# Patient Record
Sex: Female | Born: 1950 | Race: Black or African American | Hispanic: No | Marital: Married | Smoking: Former smoker
Health system: Southern US, Community
[De-identification: ages and names within clinical notes are randomized; demographics above are authoritative.]

## PROBLEM LIST (undated history)

## (undated) DIAGNOSIS — R413 Other amnesia: Secondary | ICD-10-CM

## (undated) DIAGNOSIS — M1990 Unspecified osteoarthritis, unspecified site: Secondary | ICD-10-CM

## (undated) DIAGNOSIS — I4891 Unspecified atrial fibrillation: Secondary | ICD-10-CM

## (undated) DIAGNOSIS — I639 Cerebral infarction, unspecified: Secondary | ICD-10-CM

## (undated) DIAGNOSIS — H539 Unspecified visual disturbance: Secondary | ICD-10-CM

## (undated) DIAGNOSIS — H9190 Unspecified hearing loss, unspecified ear: Secondary | ICD-10-CM

## (undated) DIAGNOSIS — E039 Hypothyroidism, unspecified: Secondary | ICD-10-CM

## (undated) DIAGNOSIS — E785 Hyperlipidemia, unspecified: Secondary | ICD-10-CM

## (undated) DIAGNOSIS — K219 Gastro-esophageal reflux disease without esophagitis: Secondary | ICD-10-CM

## (undated) DIAGNOSIS — N390 Urinary tract infection, site not specified: Secondary | ICD-10-CM

## (undated) DIAGNOSIS — J45909 Unspecified asthma, uncomplicated: Secondary | ICD-10-CM

## (undated) DIAGNOSIS — I10 Essential (primary) hypertension: Secondary | ICD-10-CM

## (undated) DIAGNOSIS — R339 Retention of urine, unspecified: Secondary | ICD-10-CM

## (undated) DIAGNOSIS — Z8719 Personal history of other diseases of the digestive system: Secondary | ICD-10-CM

## (undated) DIAGNOSIS — E669 Obesity, unspecified: Secondary | ICD-10-CM

## (undated) DIAGNOSIS — H9313 Tinnitus, bilateral: Secondary | ICD-10-CM

## (undated) DIAGNOSIS — F319 Bipolar disorder, unspecified: Secondary | ICD-10-CM

## (undated) DIAGNOSIS — M3500 Sicca syndrome, unspecified: Secondary | ICD-10-CM

## (undated) HISTORY — DX: Unspecified visual disturbance: H53.9

## (undated) HISTORY — PX: NECK SURGERY: SHX720

## (undated) HISTORY — DX: Unspecified hearing loss, unspecified ear: H91.90

## (undated) HISTORY — PX: JOINT REPLACEMENT: SHX530

## (undated) HISTORY — PX: EYE SURGERY: SHX253

## (undated) HISTORY — DX: Hyperlipidemia, unspecified: E78.5

## (undated) HISTORY — DX: Essential (primary) hypertension: I10

## (undated) HISTORY — DX: Urinary tract infection, site not specified: N39.0

## (undated) HISTORY — DX: Obesity, unspecified: E66.9

## (undated) HISTORY — PX: COLONOSCOPY: SHX174

## (undated) HISTORY — PX: ABDOMINAL HYSTERECTOMY: SHX81

## (undated) HISTORY — DX: Retention of urine, unspecified: R33.9

## (undated) HISTORY — PX: FOOT SURGERY: SHX648

## (undated) HISTORY — PX: VESICOVAGINAL FISTULA CLOSURE W/ TAH: SUR271

## (undated) HISTORY — DX: Other amnesia: R41.3

## (undated) HISTORY — PX: CARPAL TUNNEL RELEASE: SHX101

## (undated) HISTORY — PX: BACK SURGERY: SHX140

---

## 1997-06-20 ENCOUNTER — Other Ambulatory Visit: Admission: RE | Admit: 1997-06-20 | Discharge: 1997-06-20 | Payer: Self-pay | Admitting: *Deleted

## 1999-05-25 ENCOUNTER — Other Ambulatory Visit: Admission: RE | Admit: 1999-05-25 | Discharge: 1999-05-25 | Payer: Self-pay | Admitting: *Deleted

## 2005-04-12 ENCOUNTER — Other Ambulatory Visit: Payer: Self-pay

## 2005-04-12 ENCOUNTER — Inpatient Hospital Stay: Payer: Self-pay | Admitting: Internal Medicine

## 2005-04-19 ENCOUNTER — Ambulatory Visit: Payer: Self-pay | Admitting: Nurse Practitioner

## 2005-04-21 ENCOUNTER — Other Ambulatory Visit: Payer: Self-pay

## 2005-04-21 ENCOUNTER — Inpatient Hospital Stay: Payer: Self-pay | Admitting: Internal Medicine

## 2005-05-06 ENCOUNTER — Ambulatory Visit: Payer: Self-pay | Admitting: General Surgery

## 2005-07-28 ENCOUNTER — Ambulatory Visit: Payer: Self-pay | Admitting: Nurse Practitioner

## 2006-10-12 IMAGING — NM NUCLEAR MEDICINE HEPATOHBILIARY INCLUDE GB
2 series · 12 of 12 positions shown · non-contrast
Comparison: none

REASON FOR EXAM: RUQ pain
COMMENTS:

[Series 1: gallbladder dynamic · 4.80mm/px · 6 of 60 frames shown]
[frame 6/60]
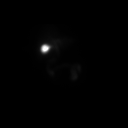
[frame 16/60]
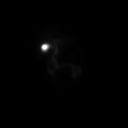
[frame 26/60]
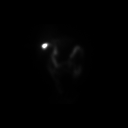
[frame 36/60]
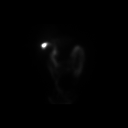
[frame 46/60]
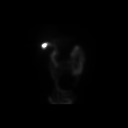
[frame 56/60]
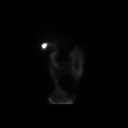

[Series 1: gallbladder dynamic (results) · 4.80mm/px · 6 of 60 frames shown]
[frame 6/60]
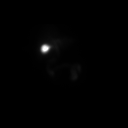
[frame 16/60]
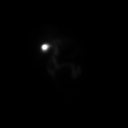
[frame 26/60]
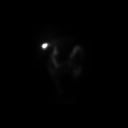
[frame 36/60]
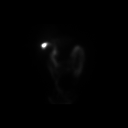
[frame 46/60]
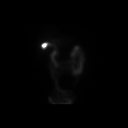
[frame 56/60]
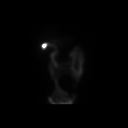

[12 of 12 positions shown; findings below may reference images not displayed]

PROCEDURE:     NM  - NM HEPATO WITH GB EJECT FRACTION  - May 06, 2005  [DATE]

RESULT:     Following injection of 7.79 mCi of Technetium 99m Choletec,
there is noted prompt visualization of tracer activity in the liver at 3
minutes. At 15 minutes, tracer activity is seen in the common duct and
proximal small bowel. At 20 minutes, tracer activity is visualized in the
gallbladder.

The gallbladder ejection fraction at 30 minutes measures 76% which is in the
normal range.
IMPRESSION: 1.     Normal Hepatobiliary Scan.
2.     Normal gallbladder ejection fraction.

## 2008-02-07 ENCOUNTER — Ambulatory Visit: Payer: Self-pay | Admitting: Family Medicine

## 2008-06-30 ENCOUNTER — Emergency Department (HOSPITAL_COMMUNITY): Admission: EM | Admit: 2008-06-30 | Discharge: 2008-07-01 | Payer: Self-pay | Admitting: Emergency Medicine

## 2008-07-08 ENCOUNTER — Inpatient Hospital Stay: Payer: Self-pay | Admitting: Internal Medicine

## 2008-07-24 ENCOUNTER — Encounter: Payer: Self-pay | Admitting: Internal Medicine

## 2008-08-18 ENCOUNTER — Encounter: Payer: Self-pay | Admitting: Internal Medicine

## 2008-08-27 ENCOUNTER — Encounter: Admission: RE | Admit: 2008-08-27 | Discharge: 2008-08-27 | Payer: Self-pay | Admitting: Neurosurgery

## 2008-09-27 ENCOUNTER — Ambulatory Visit (HOSPITAL_COMMUNITY): Admission: RE | Admit: 2008-09-27 | Discharge: 2008-09-28 | Payer: Self-pay | Admitting: Neurosurgery

## 2008-11-20 ENCOUNTER — Encounter: Admission: RE | Admit: 2008-11-20 | Discharge: 2008-11-20 | Payer: Self-pay | Admitting: Neurosurgery

## 2009-01-18 DIAGNOSIS — Z8719 Personal history of other diseases of the digestive system: Secondary | ICD-10-CM

## 2009-01-18 HISTORY — DX: Personal history of other diseases of the digestive system: Z87.19

## 2009-07-03 ENCOUNTER — Ambulatory Visit: Payer: Self-pay | Admitting: Rheumatology

## 2009-08-19 ENCOUNTER — Ambulatory Visit: Payer: Self-pay | Admitting: Obstetrics and Gynecology

## 2009-12-22 ENCOUNTER — Ambulatory Visit
Admission: RE | Admit: 2009-12-22 | Discharge: 2009-12-22 | Payer: Self-pay | Source: Home / Self Care | Admitting: Orthopedic Surgery

## 2009-12-22 ENCOUNTER — Ambulatory Visit: Payer: Self-pay | Admitting: Neurosurgery

## 2010-02-08 ENCOUNTER — Encounter: Payer: Self-pay | Admitting: Obstetrics & Gynecology

## 2010-03-31 LAB — POCT I-STAT 4, (NA,K, GLUC, HGB,HCT): Glucose, Bld: 79 mg/dL (ref 70–99)

## 2010-04-24 LAB — DIFFERENTIAL
Basophils Relative: 0 % (ref 0–1)
Lymphocytes Relative: 10 % — ABNORMAL LOW (ref 12–46)
Lymphs Abs: 0.7 10*3/uL (ref 0.7–4.0)
Monocytes Absolute: 0.6 10*3/uL (ref 0.1–1.0)
Monocytes Relative: 8 % (ref 3–12)
Neutro Abs: 4.7 10*3/uL (ref 1.7–7.7)

## 2010-04-24 LAB — BASIC METABOLIC PANEL
CO2: 30 mEq/L (ref 19–32)
Calcium: 9.7 mg/dL (ref 8.4–10.5)
Chloride: 106 mEq/L (ref 96–112)
GFR calc Af Amer: >60 mL/min (ref >60)
Sodium: 140 mEq/L (ref 135–145)

## 2010-04-24 LAB — CBC
Hemoglobin: 12.5 g/dL (ref 12.0–15.0)
MCHC: 33.1 g/dL (ref 30.0–36.0)
RBC: 4.25 MIL/uL (ref 3.87–5.11)

## 2010-04-24 LAB — TYPE AND SCREEN

## 2010-04-24 LAB — ABO/RH: ABO/RH(D): A POS

## 2010-04-27 LAB — CBC
HCT: 40.1 % (ref 36.0–46.0)
Hemoglobin: 13.3 g/dL (ref 12.0–15.0)
RBC: 4.64 MIL/uL (ref 3.87–5.11)
WBC: 7.5 10*3/uL (ref 4.0–10.5)

## 2010-04-27 LAB — BASIC METABOLIC PANEL
GFR calc Af Amer: >60 mL/min (ref >60)
GFR calc non Af Amer: >60 mL/min (ref >60)
Potassium: 3.4 mEq/L — ABNORMAL LOW (ref 3.5–5.1)
Sodium: 137 mEq/L (ref 135–145)

## 2010-04-28 ENCOUNTER — Other Ambulatory Visit: Payer: Self-pay | Admitting: Rheumatology

## 2012-06-22 ENCOUNTER — Other Ambulatory Visit: Payer: Self-pay | Admitting: Neurosurgery

## 2012-06-22 DIAGNOSIS — M5412 Radiculopathy, cervical region: Secondary | ICD-10-CM

## 2012-06-23 ENCOUNTER — Ambulatory Visit
Admission: RE | Admit: 2012-06-23 | Discharge: 2012-06-23 | Disposition: A | Discharge status: Home / Self Care | Source: Ambulatory Visit | Attending: Neurosurgery | Admitting: Neurosurgery

## 2012-06-23 DIAGNOSIS — M5412 Radiculopathy, cervical region: Secondary | ICD-10-CM

## 2013-10-01 ENCOUNTER — Other Ambulatory Visit: Payer: Self-pay | Admitting: Otolaryngology

## 2013-10-01 DIAGNOSIS — H9313 Tinnitus, bilateral: Secondary | ICD-10-CM

## 2013-10-10 ENCOUNTER — Ambulatory Visit
Admission: RE | Admit: 2013-10-10 | Discharge: 2013-10-10 | Disposition: A | Discharge status: Home / Self Care | Source: Ambulatory Visit | Attending: Otolaryngology | Admitting: Otolaryngology

## 2013-10-10 DIAGNOSIS — H9313 Tinnitus, bilateral: Secondary | ICD-10-CM

## 2013-10-10 MED ORDER — GADOBENATE DIMEGLUMINE 529 MG/ML IV SOLN
20.0000 mL | Freq: Once | INTRAVENOUS | Status: AC | PRN
  Administered 2013-10-10: 20 mL via INTRAVENOUS

## 2013-10-30 ENCOUNTER — Ambulatory Visit: Payer: Self-pay | Admitting: Neurology

## 2014-05-09 ENCOUNTER — Ambulatory Visit
Admit: 2014-05-09 | Disposition: A | Discharge status: Home / Self Care | Payer: Self-pay | Attending: Urology | Admitting: Urology

## 2014-06-20 ENCOUNTER — Ambulatory Visit (INDEPENDENT_AMBULATORY_CARE_PROVIDER_SITE_OTHER): Admitting: Neurology

## 2014-06-20 ENCOUNTER — Encounter: Payer: Self-pay | Admitting: Neurology

## 2014-06-20 VITALS — BP 148/90 | HR 76 | Resp 18 | Ht 69.5 in | Wt 231.6 lb

## 2014-06-20 DIAGNOSIS — H9319 Tinnitus, unspecified ear: Secondary | ICD-10-CM | POA: Insufficient documentation

## 2014-06-20 DIAGNOSIS — G5600 Carpal tunnel syndrome, unspecified upper limb: Secondary | ICD-10-CM | POA: Insufficient documentation

## 2014-06-20 DIAGNOSIS — R51 Headache: Secondary | ICD-10-CM

## 2014-06-20 DIAGNOSIS — M6289 Other specified disorders of muscle: Secondary | ICD-10-CM | POA: Insufficient documentation

## 2014-06-20 DIAGNOSIS — J45909 Unspecified asthma, uncomplicated: Secondary | ICD-10-CM | POA: Insufficient documentation

## 2014-06-20 DIAGNOSIS — R4189 Other symptoms and signs involving cognitive functions and awareness: Secondary | ICD-10-CM | POA: Diagnosis not present

## 2014-06-20 DIAGNOSIS — E785 Hyperlipidemia, unspecified: Secondary | ICD-10-CM | POA: Insufficient documentation

## 2014-06-20 DIAGNOSIS — G5601 Carpal tunnel syndrome, right upper limb: Secondary | ICD-10-CM | POA: Diagnosis not present

## 2014-06-20 DIAGNOSIS — H9313 Tinnitus, bilateral: Secondary | ICD-10-CM

## 2014-06-20 DIAGNOSIS — M1990 Unspecified osteoarthritis, unspecified site: Secondary | ICD-10-CM | POA: Insufficient documentation

## 2014-06-20 DIAGNOSIS — R29898 Other symptoms and signs involving the musculoskeletal system: Secondary | ICD-10-CM

## 2014-06-20 DIAGNOSIS — Z9889 Other specified postprocedural states: Secondary | ICD-10-CM | POA: Diagnosis not present

## 2014-06-20 DIAGNOSIS — I10 Essential (primary) hypertension: Secondary | ICD-10-CM | POA: Insufficient documentation

## 2014-06-20 DIAGNOSIS — J302 Other seasonal allergic rhinitis: Secondary | ICD-10-CM | POA: Insufficient documentation

## 2014-06-20 DIAGNOSIS — L309 Dermatitis, unspecified: Secondary | ICD-10-CM | POA: Insufficient documentation

## 2014-06-20 DIAGNOSIS — K219 Gastro-esophageal reflux disease without esophagitis: Secondary | ICD-10-CM | POA: Insufficient documentation

## 2014-06-20 DIAGNOSIS — R519 Headache, unspecified: Secondary | ICD-10-CM | POA: Insufficient documentation

## 2014-06-20 DIAGNOSIS — E669 Obesity, unspecified: Secondary | ICD-10-CM | POA: Insufficient documentation

## 2014-06-20 NOTE — Progress Notes (Addendum)
[facility]  PATIENT: Jessica Norman DOB: [birth year]  REFERRING DOCTOR OR PCP:  Delight Stare SOURCE: paitent and records from Dr. Manuella Ghazi.     _________________________________   HISTORICAL  CHIEF COMPLAINT:  Chief Complaint  Patient presents with  . Tremors    Sts. she had had tinnitus for yrs. She saw ENT in Oct. 2015 and he ordered a CT head.  Sts. changes on the CT prompted a neurologist--she was seen at the [facility] in . Sts. emg was done to investigate her c/o difficulty walking.  "When I tell my limbs to do something they are not responding."  Sts. she was told emg was abnormal, and he felt she may have Mysathenia Gravis.  She was started on Mestinon 72m bid.  Sts. she felt better until she had cataracts removed (bilat) a couple of  . Extremity Pain    months ago.  Sts. since then vision is worse and she has had increased craming in right arm/leg.  Sts. memory is worse--she forgets little things like the names of doctors she has seen. Sts. 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  She has been on leave since April of this yr./fim  . Memory Loss  . Sleep Apnea    Sts. sleep study showed mild osa, not severe enough for cpap./fim    HISTORY OF PRESENT ILLNESS:  I had the pleasure of seeing your patient, Jessica Norman for a neurologic consultation regarding her weakness and memory issues.    She notes that in February 2015 she began to have difficulty with her memory. Separately, she was having an issue with tinnitus and was seeing Dr. WErik Obey(ENT).  She underwent a CT scan followed by an MRI of the brain for tinnitus that year. The MRI of the brain was abnormal showing frontal and parietal subcortical white matter foci. There were also white matter foci within the pons. This prompted referral to to Dr. SManuella Ghazi   She is reporting several different types of  cognitive issues. 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 Problems that she noted included decreased memory, decreased multitasking and decreased focus. As examples, she would say something to clients and then not remember what was discussed.   She did have a sleep study performed as OSA can make memory worse. That was in October 2015 and showed no significant overall sleep apnea though there was moderate REM related sleep apnea with a REM AHI = 17.  Around early November, she began to note more difficulties with her strength. Specifically she had sporadic mild double vision and also had difficulty getting out of a chair. She had no ptosis.   Dr. SManuella Ghazidid a NCV/EMG in 12/28/2013. It showed carpal tunnel syndrome, right greater than left    Repetitive stimulation tests were not performed.    She also had blood work around that time. The acetylcholine receptor antibody was negative. Blocking antibodies and modulating antibodies were also negative.   However, based on her symptoms she was felt to have possible myasthenia gravis and was placed on Mestinon. She reports that she improved some after being placed on Mestinon.   However, she has noted some left abdominal pain that started after she began the  Mestinon. She has not noted any change in her bowel. About the same time as her studies and being placed on Mestinon, she also had cataract surgery. She reports that her vision got quite a bit better and she is uncertain where there is due to the surgery is being removed or due to  the Mestinon.   Since the surgery, distance vision is significantly better but reading vision is much worse and she is having trouble finding glasses that help her reading.   Although she feels her strength is little better, she is still is unable to rise from a chair without  using her arms.     I reviewed the studies including raw data:   NCV showed mild right CTS (minimal left).   She had two carpal tunnel injections, the first one helped but the second one did not help.      I personally reviewed the MRI images showing white matter changes predominantly in the subcortical and deep are both hemispheres and also in the pons.   I also reviewed the MRI of the cervical spine from 06/23/2012. The spinal cord appears normal.  She sees Dr. Viona Gilmore. Jessica Norman (psychiatrist [phone]) for bipolar disorder. She is on Zoloft and Depakote. She has been on these medications a long time and there have been no changes in the last year.  REVIEW OF SYSTEMS: Constitutional: No fevers, chills, sweats, or change in appetite Eyes: No visual changes, double vision, eye pain Ear, nose and throat: No hearing loss, ear pain, nasal congestion, sore throat Cardiovascular: No chest pain, palpitations Respiratory: No shortness of breath at rest or with exertion.   No wheezes GastrointestinaI: No nausea, vomiting, diarrhea, abdominal pain, fecal incontinence Genitourinary: No dysuria, urinary retention or frequency.  No nocturia. Musculoskeletal: No neck pain, back pain Integumentary: No rash, pruritus, skin lesions Neurological: as above Psychiatric: No depression at this time.  No anxiety Endocrine: No palpitations, diaphoresis, change in appetite, change in weigh or increased thirst Hematologic/Lymphatic: No anemia, purpura, petechiae. Allergic/Immunologic: No itchy/runny eyes, nasal congestion, recent allergic reactions, rashes  ALLERGIES: Allergies  Allergen Reactions  . Codeine Itching  . Penicillin G     Other reaction(s): ITCHING    HOME MEDICATIONS:  Current outpatient prescriptions:  .  aluminum hydroxide-magnesium carbonate (GAVISCON) 95-358 MG/15ML SUSP, Take by mouth., Disp: , Rfl:  .  amLODipine (NORVASC) 10 MG tablet, Take by mouth., Disp: , Rfl:  .  aspirin EC 81 MG  tablet, Take by mouth., Disp: , Rfl:  .  divalproex (DEPAKOTE ER) 500 MG 24 hr tablet, Take by mouth., Disp: , Rfl:  .  esomeprazole (NEXIUM) 40 MG capsule, Take by mouth., Disp: , Rfl:  .  fexofenadine (ALLEGRA) 180 MG tablet, Take 180 mg by mouth., Disp: , Rfl:  .  fluticasone (FLONASE) 50 MCG/ACT nasal spray, Place into the nose., Disp: , Rfl:  .  Fluticasone-Salmeterol (ADVAIR) 250-50 MCG/DOSE AEPB, Inhale into the lungs., Disp: , Rfl:  .  lisinopril-hydrochlorothiazide (PRINZIDE,ZESTORETIC) 20-25 MG per tablet, Take by mouth., Disp: , Rfl:  .  lovastatin (MEVACOR) 10 MG tablet, Take by mouth., Disp: , Rfl:  .  montelukast (SINGULAIR) 10 MG tablet, Take by mouth., Disp: , Rfl:  .  nabumetone (RELAFEN) 500 MG tablet, Take by mouth., Disp: , Rfl:  .  promethazine (PHENERGAN) 25 MG tablet, Take 25 mg by mouth., Disp: , Rfl:  .  pyridostigmine (MESTINON) 60 MG tablet, 1/2 tablet twice a day for 2 weeks then 1  tab twice a day, Disp: , Rfl:  .  sertraline (ZOLOFT) 100 MG tablet, Take by mouth., Disp: , Rfl:   PAST MEDICAL HISTORY: Past Medical History  Diagnosis Date  . Hearing loss   . Hypertension   . Memory loss   . Vision abnormalities     PAST SURGICAL HISTORY: Past Surgical History  Procedure Laterality Date  . Vesicovaginal fistula closure w/ tah    . Back surgery    . Neck surgery    . Carpal tunnel release Right     FAMILY HISTORY: Family History  Problem Relation Age of Onset  . Diabetes Mother   . Stroke Mother   . COPD Father     SOCIAL HISTORY:  History   Social History  . Marital Status: Married    Spouse Name: N/A  . Number of Children: N/A  . Years of Education: N/A   Occupational History  . Not on file.   Social History Main Topics  . Smoking status: Former Research scientist (life sciences)  . Smokeless tobacco: Not on file  . Alcohol Use: 0.0 oz/week    0 Standard drinks or equivalent per week     Comment: occasional wine or beer./fim  . Drug Use: No  . Sexual  Activity: Not on file   Other Topics Concern  . Not on file   Social History Narrative  . No narrative on file     PHYSICAL EXAM  Filed Vitals:   06/20/14 1014  BP: 148/90  Pulse: 76  Resp: 18  Height: 5' 9.5" (1.765 m)  Weight: 231 lb 9.6 oz (105.053 kg)    Body mass index is 33.72 kg/(m^2).   General: The patient is well-developed and well-nourished and in no acute distress  Eyes:  Funduscopic exam shows normal optic discs and retinal vessels.  Neck: The neck is supple, no carotid bruits are noted.  The neck is nontender.  Cardiovascular: The heart has a regular rate and rhythm with a normal S1 and S2. There were no murmurs, gallops or rubs.  Skin: Extremities show some edema.  Musculoskeletal:  Back is nontender  Neurologic Exam  Mental status: The patient is alert and oriented x 3 at the time of the examination. The patient has apparent normal  remote but poor recent memory (1/3 after 4 minutes), with an apparently normal attention span and concentration ability (100-93-85-74-no; WORLD-DLROW).   Clock is well spaced and very well drawn with good hand placement.  Speech is normal.  Cranial nerves: Extraocular movements are full. Pupils are equal, round, and reactive to light and accomodation.  Visual fields are full.  Facial symmetry is present. There is good facial sensation to soft touch bilaterally.Facial strength is normal.  Trapezius and sternocleidomastoid strength is normal. No dysarthria is noted.  The tongue is midline, and the patient has symmetric elevation of the soft palate. No obvious hearing deficits are noted.  Motor:  Muscle bulk is normal.   Tone is normal. Strength is  5 / 5 in all 4 extremities except prox leg (4- to 4/5). Functionally, she has difficulty raising out of a chair without using at least one arm. She cannot rise from a squat without support.  Sensory: Sensory testing is intact to pinprick, soft touch and vibration sensation in all 4  extremities.  Coordination: Cerebellar testing reveals good finger-nose-finger.  Gait and station: Station is normal.   Gait is arthritic. Tandem gait is wide. Romberg is negative.   Reflexes: Deep tendon reflexes are  symmetric and she has spread at her knees and normal DTRs elsewhere   Plantar responses are flexor.    DIAGNOSTIC DATA (LABS, IMAGING, TESTING) - I reviewed patient records, labs, notes, testing and imaging myself where available.  Lab Results  Component Value Date   WBC 6.9 09/24/2008   HGB 13.3 12/22/2009   HCT 39.0 12/22/2009   MCV 89.2 09/24/2008   PLT 168 09/24/2008      Component Value Date/Time   NA 141 12/22/2009 1310   K 4.5 12/22/2009 1310   CL 106 09/24/2008 1025   CO2 30 09/24/2008 1025   GLUCOSE 79 12/22/2009 1310   BUN 14 09/24/2008 1025   CREATININE 0.93 09/24/2008 1025   CALCIUM 9.7 09/24/2008 1025   GFRNONAA >60 09/24/2008 1025   GFRAA  09/24/2008 1025    >60        The eGFR has been calculated using the MDRD equation. This calculation has not been validated in all clinical situations. eGFR's persistently <60 mL/min signify possible Chronic Kidney Disease.       ASSESSMENT AND PLAN  Proximal leg weakness - Plan: Vitamin B12, ANA w/Reflex, CK, MR Cervical Spine Wo Contrast, MR Thoracic Spine Wo Contrast  Disturbed cognition - Plan: Vitamin B12, ANA w/Reflex, CK, MR Cervical Spine Wo Contrast, MR Thoracic Spine Wo Contrast  Carpal tunnel syndrome of right wrist  History of cervical spinal surgery - Plan: MR Cervical Spine Wo Contrast  Tinnitus, bilateral   In summary, Jessica Norman is a 64 year old woman with proximal weakness and memory difficulties. Evaluating her weakness is difficult as she is currently on Mestinon I am not certain that she has myasthenia gravis, however. Her antibody test was normal though. Negative test are fairly common. She does not have any weakness at all in the face right now nor in the neck or  shoulders. Certainly MG is possible. I will ask her to hold her Mestinon for couple weeks. She significantly worsen she will go back on. If she does not change at all, she will stay off the medicine. If she does feel that she did better on Mestinon in myasthenia gravis is more likely and I want to also check a CT scan of the chest to make sure that she does not have a thymoma and I would consider steroid therapy as she is getting an incomplete response to Mestinon. However, if she feels the same after stopping the medication, then MG is less likely and I would hold off on these tests.   On exam, her knee reflexes were increased and other etiologies could also be considered. I will  rule out some other etiologies of proximal weakness and we will obtain an MRI of the cervical spine and thoracic spine to make sure that she does not have a myelopathy. She has had prior ACDF surgery in the neck.  We will also check CK, ANA and B12.  Her second problem is difficulty with memory and other cognitive skills. Today, she did show decreased short-term memory and reduced attention but did very well with executive function and visual spatial tasks. This pattern might be more consistent with poor focus that with an actual memory disorder. If not contraindicated by her bipolar disease, a stimulant might be considered.  A third problem is mild carpal tunnel syndrome on the right. I let her know I would be happy to do a carpal tunnel injection again if she would like. She will think about this letter no.  She will  return to see me in 6 weeks or sooner if she has new or worsening neurologic symptoms.  Thank you for asking me to see Jessica Norman for neurologic consultation today. Please let me know if I can be of further assistance with her or other patients in the future.   Richard A. Felecia Shelling, MD, PhD 08/22/6312, 97:02 AM Certified in Neurology, Clinical Neurophysiology, Sleep Medicine, Pain Medicine and Neuroimaging  [facility] [address] [place], [place] [address] [phone]

## 2014-06-21 LAB — ENA+DNA/DS+SJORGEN'S
ENA RNP Ab: 0.2 AI (ref 0.0–0.9)
ENA SSA (RO) Ab: 2.8 AI — ABNORMAL HIGH (ref 0.0–0.9)
dsDNA Ab: 1 IU/mL (ref 0–9)

## 2014-06-21 LAB — ANA W/REFLEX: Anti Nuclear Antibody(ANA): POSITIVE — AB

## 2014-06-21 LAB — CK: CK TOTAL: 181 U/L — AB (ref 24–173)

## 2014-06-21 LAB — VITAMIN B12: Vitamin B-12: 1135 pg/mL — ABNORMAL HIGH (ref 211–946)

## 2014-07-03 ENCOUNTER — Telehealth: Payer: Self-pay | Admitting: *Deleted

## 2014-07-03 NOTE — Telephone Encounter (Signed)
-----   Message from Asa Lente, MD sent at 07/03/2014  8:34 AM EDT ----- One if her labs shows she has possible lupus or similar process.   I would like her to see rheumatology for a consult  "Muscle weakness and positive SSA/Ro"

## 2014-07-03 NOTE — Telephone Encounter (Signed)
I have spoken with Jessica Norman this morning and per RAS, advised her of abnormal labwork that may indicate a Lupus or other autoimmune type dz.  I have advised of the need for rheumatologist workup.  She is agreeable, sts. has been seen by rheumatologist Dr. Saverio Danker in [place], and would prefer to f/u with him.  Sts. she will call for appt.  Per her request, I have faxed ov note and lab results to Dr. Gavin Potters at fax # [phone]/fim

## 2014-07-05 DIAGNOSIS — R29898 Other symptoms and signs involving the musculoskeletal system: Secondary | ICD-10-CM | POA: Diagnosis not present

## 2014-07-10 ENCOUNTER — Other Ambulatory Visit: Payer: Self-pay | Admitting: Neurology

## 2014-07-10 DIAGNOSIS — R29898 Other symptoms and signs involving the musculoskeletal system: Secondary | ICD-10-CM

## 2014-07-10 DIAGNOSIS — Z9889 Other specified postprocedural states: Secondary | ICD-10-CM

## 2014-07-10 DIAGNOSIS — R4189 Other symptoms and signs involving cognitive functions and awareness: Secondary | ICD-10-CM

## 2014-07-11 ENCOUNTER — Telehealth: Payer: Self-pay | Admitting: *Deleted

## 2014-07-11 NOTE — Telephone Encounter (Signed)
-----   Message from Richard A Sater, MD sent at 07/10/2014  5:47 PM EDT ----- Please let her know the MRi shows some degenerative disc and arthritis changes but no pressure on the spinal cord 

## 2014-07-11 NOTE — Telephone Encounter (Signed)
I have spoken with Saline and per RAS, advised that mri showed arthritic, degen. type changes, but nothing putting pressure on the spinal cord.  She verbalized understanding of same/fim

## 2014-07-11 NOTE — Telephone Encounter (Signed)
Left message with female that answered phone for pt. to call.  No details left/fim

## 2014-07-11 NOTE — Telephone Encounter (Signed)
-----   Message from Asa Lente, MD sent at 07/10/2014  5:47 PM EDT ----- Please let her know the MRi shows some degenerative disc and arthritis changes but no pressure on the spinal cord

## 2014-07-11 NOTE — Telephone Encounter (Signed)
Patient called returning Jessica Norman's call. °

## 2014-07-11 NOTE — Telephone Encounter (Signed)
I have spoken with Nely this afternoon and advised that per RAS, mri showed arthritic, degen. type changes, but nothing putting pressure on spinal cord. She verbalized understanding of same/fim

## 2014-07-16 DIAGNOSIS — K6389 Other specified diseases of intestine: Secondary | ICD-10-CM | POA: Insufficient documentation

## 2014-07-16 DIAGNOSIS — K760 Fatty (change of) liver, not elsewhere classified: Secondary | ICD-10-CM | POA: Insufficient documentation

## 2014-07-16 DIAGNOSIS — R1012 Left upper quadrant pain: Secondary | ICD-10-CM | POA: Insufficient documentation

## 2014-07-16 DIAGNOSIS — R9389 Abnormal findings on diagnostic imaging of other specified body structures: Secondary | ICD-10-CM | POA: Insufficient documentation

## 2014-08-01 ENCOUNTER — Ambulatory Visit (INDEPENDENT_AMBULATORY_CARE_PROVIDER_SITE_OTHER): Admitting: Neurology

## 2014-08-01 ENCOUNTER — Encounter: Payer: Self-pay | Admitting: Neurology

## 2014-08-01 VITALS — BP 130/68 | HR 68 | Resp 16 | Ht 69.5 in | Wt 230.0 lb

## 2014-08-01 DIAGNOSIS — H9313 Tinnitus, bilateral: Secondary | ICD-10-CM

## 2014-08-01 DIAGNOSIS — G4700 Insomnia, unspecified: Secondary | ICD-10-CM

## 2014-08-01 DIAGNOSIS — R29898 Other symptoms and signs involving the musculoskeletal system: Secondary | ICD-10-CM

## 2014-08-01 DIAGNOSIS — R4189 Other symptoms and signs involving cognitive functions and awareness: Secondary | ICD-10-CM

## 2014-08-01 NOTE — Progress Notes (Signed)
[facility]  PATIENT: Jessica Norman DOB: April 02, 1950  REFERRING DOCTOR OR PCP:  Delight Stare SOURCE: paitent and records from Dr. Manuella Ghazi.     _________________________________   HISTORICAL  CHIEF COMPLAINT:  Chief Complaint  Patient presents with  . Memory Loss    Sts. difficulty with memory is about the same.  Sts. she did hold Mestinon for 2 weeks as instructed at last ov.  Sts. she felt no difference in sx. off of the Mestinon than she did on it.  She did, however restart it after 2 weeks.  She had autoimmune labs that were abnormal.  She has followed up with a rheumatologist (Dr. Jefm Bryant in [place]), and sts. he has more testing scheduled/fim  . Extremity Weakness    HISTORY OF PRESENT ILLNESS:  Jessica Norman is a 64 yo woman recently seen for proximal weakness and memory difficulties.  Weakness:   She tried going off of Mestinon and felt the same as she did when she was on it did go back on the medication and continued to feel the same. We discussed stopping the Mestinon as the likelihood of myasthenia is low.     She has difficulty getting out of a chair and climbing stairs but has normal strength outside of the proximal legs.   No ptosis or diplopia.    The acetylcholine receptor antibody was negative. Blocking antibodies and modulating antibodies were also negative.   NCV showed mild right CTS (minimal left).    I  reviewed the MRI of the cervical spine from 06/23/2012. The spinal cord appears normal.      Memory/insomnia:   Since February 2015 she began to have difficulty with her memory.  MRI of the brain  was abnormal showing frontal and parietal subcortical white matter foci. There were also white matter foci within the pons. This prompted referral to to Dr. Manuella Ghazi.   She is reporting several different types of cognitive issues including decreased memory, decreased multitasking and decreased focus.   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    She is sleepy but does not actually doze off much.   She feels she is a light sleeper.  She wakes up quite a bit most nights and often has difficulty falling back asleep she uses the bathroom 2-5 times nightly.   She has trazodone and it helps sleep slightly but has a hangover effect.  A few years ago, she was on gabapentin and does not remember if she slept better.        Tinnitus:   Bilateral tinnitus is noted.   MRI of IAC region was fine.     ANA was positive (anti-Ro).   She repots a dry mouth but not dry eyes.    She is seing a rheumatologist who is doing more tests  She sees Dr. Viona Gilmore. Lanetta Inch (psychiatrist [phone]) for bipolar disorder. She is on Zoloft and Depakote.  Marland Kitchen   REVIEW OF SYSTEMS: Constitutional: No fevers, chills, sweats, or change in appetite Eyes: No visual changes, double vision, eye pain Ear, nose and throat: No hearing loss, ear pain, nasal congestion, sore throat Cardiovascular: No chest pain, palpitations Respiratory: No shortness of breath at rest or with exertion.   No wheezes GastrointestinaI: No nausea, vomiting, diarrhea, abdominal pain, fecal incontinence Genitourinary: No dysuria, urinary  retention or frequency.  No nocturia. Musculoskeletal: No neck pain, back pain Integumentary: No rash, pruritus, skin lesions Neurological: as above Psychiatric: No depression at this time.  No anxiety Endocrine: No palpitations, diaphoresis, change in appetite, change in weigh or increased thirst Hematologic/Lymphatic: No anemia, purpura, petechiae. Allergic/Immunologic: No itchy/runny eyes, nasal congestion, recent allergic reactions, rashes  ALLERGIES: Allergies  Allergen Reactions  . Codeine Itching  . Penicillin G     Other reaction(s): ITCHING    HOME MEDICATIONS:  Current outpatient prescriptions:  .  aluminum  hydroxide-magnesium carbonate (GAVISCON) 95-358 MG/15ML SUSP, Take by mouth., Disp: , Rfl:  .  amLODipine (NORVASC) 10 MG tablet, Take by mouth., Disp: , Rfl:  .  aspirin EC 81 MG tablet, Take by mouth., Disp: , Rfl:  .  divalproex (DEPAKOTE ER) 500 MG 24 hr tablet, Take by mouth., Disp: , Rfl:  .  esomeprazole (NEXIUM) 40 MG capsule, Take by mouth., Disp: , Rfl:  .  fexofenadine (ALLEGRA) 180 MG tablet, Take 180 mg by mouth., Disp: , Rfl:  .  fluticasone (FLONASE) 50 MCG/ACT nasal spray, Place into the nose., Disp: , Rfl:  .  Fluticasone-Salmeterol (ADVAIR) 250-50 MCG/DOSE AEPB, Inhale into the lungs., Disp: , Rfl:  .  lisinopril-hydrochlorothiazide (PRINZIDE,ZESTORETIC) 20-25 MG per tablet, Take by mouth., Disp: , Rfl:  .  lovastatin (MEVACOR) 10 MG tablet, Take by mouth., Disp: , Rfl:  .  nabumetone (RELAFEN) 500 MG tablet, Take by mouth., Disp: , Rfl:  .  pyridostigmine (MESTINON) 60 MG tablet, 1/2 tablet twice a day for 2 weeks then 1 tab twice a day, Disp: , Rfl:  .  sertraline (ZOLOFT) 100 MG tablet, Take by mouth., Disp: , Rfl:  .  montelukast (SINGULAIR) 10 MG tablet, Take by mouth., Disp: , Rfl:  .  promethazine (PHENERGAN) 25 MG tablet, Take 25 mg by mouth., Disp: , Rfl:   PAST MEDICAL HISTORY: Past Medical History  Diagnosis Date  . Hearing loss   . Hypertension   . Memory loss   . Vision abnormalities     PAST SURGICAL HISTORY: Past Surgical History  Procedure Laterality Date  . Vesicovaginal fistula closure w/ tah    . Back surgery    . Neck surgery    . Carpal tunnel release Right     FAMILY HISTORY: Family History  Problem Relation Age of Onset  . Diabetes Mother   . Stroke Mother   . COPD Father     SOCIAL HISTORY:  History   Social History  . Marital Status: Married    Spouse Name: N/A  . Number of Children: N/A  . Years of Education: N/A   Occupational History  . Not on file.   Social History Main Topics  . Smoking status: Former Research scientist (life sciences)  .  Smokeless tobacco: Not on file  . Alcohol Use: 0.0 oz/week    0 Standard drinks or equivalent per week     Comment: occasional wine or beer./fim  . Drug Use: No  . Sexual Activity: Not on file   Other Topics Concern  . Not on file   Social History Narrative     PHYSICAL EXAM  Filed Vitals:   08/01/14 0917  BP: 130/68  Pulse: 68  Resp: 16  Height: 5' 9.5" (1.765 m)  Weight: 230 lb (104.327 kg)    Body mass index is 33.49 kg/(m^2).   General: The patient is well-developed and well-nourished and in no acute distress  Neurologic Exam  Mental status:  The patient is alert and oriented x 3 at the time of the examination.   Speech is normal.  Cranial nerves: Extraocular movements are full.   Facial symmetry is present. There is good facial sensation to soft touch bilaterally.Facial strength is normal.  Trapezius and sternocleidomastoid strength is normal. No dysarthria is noted.  The tongue is midline, and the patient has symmetric elevation of the soft palate. No obvious hearing deficits are noted.  Motor:  Muscle bulk is normal.   Tone is normal. Strength is  5 / 5 in all 4 extremities except prox leg (4- to 4/5). Functionally, she has difficulty raising out of a chair without using at least one arm. She cannot rise from a squat without support.  Sensory: Sensory testing is intact to touch and vibration sensation in all 4 extremities.  Coordination: Cerebellar testing reveals good finger-nose-finger.  Gait and station: Station is normal.   Gait is arthritic.   Romberg is negative.   Reflexes: Deep tendon reflexes are symmetric and she has spread at her knees and normal DTRs elsewhere        DIAGNOSTIC DATA (LABS, IMAGING, TESTING) - I reviewed patient records, labs, notes, testing and imaging myself where available.  Lab Results  Component Value Date   WBC 6.9 09/24/2008   HGB 13.3 12/22/2009   HCT 39.0 12/22/2009   MCV 89.2 09/24/2008   PLT 168 09/24/2008        Component Value Date/Time   NA 141 12/22/2009 1310   K 4.5 12/22/2009 1310   CL 106 09/24/2008 1025   CO2 30 09/24/2008 1025   GLUCOSE 79 12/22/2009 1310   BUN 14 09/24/2008 1025   CREATININE 0.93 09/24/2008 1025   CALCIUM 9.7 09/24/2008 1025   GFRNONAA >60 09/24/2008 1025   GFRAA  09/24/2008 1025    >60        The eGFR has been calculated using the MDRD equation. This calculation has not been validated in all clinical situations. eGFR's persistently <60 mL/min signify possible Chronic Kidney Disease.       ASSESSMENT AND PLAN  Disturbed cognition  Proximal leg weakness  Tinnitus, bilateral  Insomnia    1.   D/c mestinon.  If weakness worsens, consider referral to [facility] for Single Fiber EMG 2.   Gabapentin up to 600 mg nightly for insomnia.  Hopefully, memory issues will improve with longer deeper sleep 3.   She will discuss adding a stimulant with Dr. Thurmond Butts at the next visit.  I will fax him my note.   4.   rtc 4 months, sooner if problems   Freya Zobrist A. Felecia Shelling, MD, PhD 6/65/9935, 7:01 AM Certified in Neurology, Clinical Neurophysiology, Sleep Medicine, Pain Medicine and Neuroimaging  [facility] [address] [place], [place] [address] [phone]

## 2014-08-12 ENCOUNTER — Other Ambulatory Visit: Payer: Self-pay | Admitting: Rheumatology

## 2014-08-12 DIAGNOSIS — M25512 Pain in left shoulder: Secondary | ICD-10-CM

## 2014-08-16 ENCOUNTER — Ambulatory Visit
Admission: RE | Admit: 2014-08-16 | Discharge: 2014-08-16 | Disposition: A | Discharge status: Home / Self Care | Source: Ambulatory Visit | Attending: Rheumatology | Admitting: Rheumatology

## 2014-08-16 ENCOUNTER — Ambulatory Visit

## 2014-08-16 DIAGNOSIS — M19012 Primary osteoarthritis, left shoulder: Secondary | ICD-10-CM | POA: Diagnosis not present

## 2014-08-16 DIAGNOSIS — M7552 Bursitis of left shoulder: Secondary | ICD-10-CM | POA: Insufficient documentation

## 2014-08-16 DIAGNOSIS — M7522 Bicipital tendinitis, left shoulder: Secondary | ICD-10-CM | POA: Diagnosis not present

## 2014-08-16 DIAGNOSIS — M25512 Pain in left shoulder: Secondary | ICD-10-CM | POA: Diagnosis present

## 2014-11-25 ENCOUNTER — Encounter: Payer: Self-pay | Admitting: Neurology

## 2014-11-25 ENCOUNTER — Ambulatory Visit (INDEPENDENT_AMBULATORY_CARE_PROVIDER_SITE_OTHER): Admitting: Neurology

## 2014-11-25 VITALS — BP 108/58 | HR 70 | Resp 16 | Ht 69.5 in | Wt 227.4 lb

## 2014-11-25 DIAGNOSIS — R4189 Other symptoms and signs involving cognitive functions and awareness: Secondary | ICD-10-CM

## 2014-11-25 DIAGNOSIS — M6289 Other specified disorders of muscle: Secondary | ICD-10-CM

## 2014-11-25 DIAGNOSIS — R200 Anesthesia of skin: Secondary | ICD-10-CM | POA: Diagnosis not present

## 2014-11-25 DIAGNOSIS — G4700 Insomnia, unspecified: Secondary | ICD-10-CM

## 2014-11-25 DIAGNOSIS — R29898 Other symptoms and signs involving the musculoskeletal system: Secondary | ICD-10-CM | POA: Diagnosis not present

## 2014-11-25 NOTE — Progress Notes (Signed)
[facility]  PATIENT: Jessica Norman DOB: [birth year]  REFERRING DOCTOR OR PCP:  Delight Stare SOURCE: paitent and records from Dr. Manuella Ghazi.     _________________________________   HISTORICAL  CHIEF COMPLAINT:  Chief Complaint  Patient presents with  . Memory Loss    Sts. weakness is about the same since stopping Mestinon.  Sts. she is sleeping better with Gabapentin and feels memory is some better.  Since last ov, she has been dx. with Sjogren's Syndrome/fim  . Insomnia  . Extremity Weakness    HISTORY OF PRESENT ILLNESS:  Jessica Norman is a 64 yo woman recently seen for proximal weakness and memory difficulties.  Weakness/numbness: She remains off mestinon and feels the same as she did on the medication.    If anything, she feels strength may be minimally better.   She sometimes has difficulty getting out of a chair and climbing stairs but has normal strength outside of the proximal legs. No ptosis or diplopia. The acetylcholine receptor antibody was negative. Blocking antibodies and modulating antibodies were also negative. NCV showed mild right CTS (minimal left). I reviewed the MRI of the cervical spine from 06/23/2012. The spinal cord appears normal. She also notes numbness if there is pressure on her arm or leg or if she sits a while.      Shoulder pain:   She has left shoulder pain and has seen orthopedics for a shot and may need surgery.     Memory/insomnia: The past 1 1/2 years she has noted difficulties with memory.  As examples, she loses train of thoughts easily.  She cannot remember passwords.   During conversation at work, she forgets the next thing to say or do.  She also notes decreased multitasking and decreased focus.  MRI of the brain2015 was abnormal showing frontal and parietal subcortical white matter foci. There were also white matter foci within the pons.  PSG at [facility] in October 2015 showed no significant  overall sleep apnea though there was moderate REM related sleep apnea with a REM AHI = 17. She is sleepy but does not actually doze off much. She feels she is a light sleeper. She wakes up quite a bit most nights and often has difficulty falling back asleep she uses the bathroom 2-5 times nightly.Gabapentin has helped her sleep pattern and she wakes up less.    ANA was positive (anti-Ro). She reports a dry mouth but not dry eyes. She is seing a rheumatologist who diagnosed her with Sjogren's.     Plaquenil was started and she tolerates it well.     Shehas some parotid swelling on her right.   She is going to have a procedure to see if there is a stone in her salivary gland.     She sees Dr. Viona Gilmore. Lanetta Inch (psychiatrist [phone]) for bipolar disorder. She is on Zoloft and Depakote. .   I personally reviewed the MRI images from 07/10/2014 of the cervical spine and thoracic spine. Occur with the results below. Cervical Spine 07/10/14 IMPRESSION: This is an abnormal MRI of the cervical spine show the following: 1. Mild spinal stenosis and moderate left neural foraminal narrowing at C5-C6 due to uncovertebral spurring and disc bulging, more to the left. Although there is no definite nerve root compression there is some encroachment on the exiting left C6 nerve root. 2. Prior ACDF at C3-C5. Neural foramina are widely patent at the 2 levels. At C4-C5 there is a minimal central bony  ridge that effaces the thecal sac without causing spinal cord compression. 3. The spinal cord appears normal. 4.  Compared to an MRI dated 12/22/2009, there has been no definite interval change.  Thoracis Spine 07/10/14: IMPRESSION: This is an abnormal MRI of the thoracic spine showing multilevel degenerative changes as detailed above there does not appear to be any spinal cord compression though the thecal sac is indented at T8-T9. There does not appear to be any nerve root  impingement.    REVIEW OF SYSTEMS: Constitutional: No fevers, chills, sweats, or change in appetite Eyes: No visual changes, double vision, eye pain Ear, nose and throat: No hearing loss, ear pain, nasal congestion, sore throat Cardiovascular: No chest pain, palpitations Respiratory: No shortness of breath at rest or with exertion.   No wheezes GastrointestinaI: No nausea, vomiting, diarrhea, abdominal pain, fecal incontinence Genitourinary: No dysuria, urinary retention or frequency.  No nocturia. Musculoskeletal: No neck pain, back pain Integumentary: No rash, pruritus, skin lesions Neurological: as above Psychiatric: No depression at this time.  No anxiety Endocrine: No palpitations, diaphoresis, change in appetite, change in weigh or increased thirst Hematologic/Lymphatic: No anemia, purpura, petechiae. Allergic/Immunologic: No itchy/runny eyes, nasal congestion, recent allergic reactions, rashes  ALLERGIES: Allergies  Allergen Reactions  . Codeine Itching  . Penicillin G     Other reaction(s): ITCHING    HOME MEDICATIONS:  Current outpatient prescriptions:  .  aluminum hydroxide-magnesium carbonate (GAVISCON) 95-358 MG/15ML SUSP, Take by mouth., Disp: , Rfl:  .  amLODipine (NORVASC) 10 MG tablet, Take by mouth., Disp: , Rfl:  .  aspirin EC 81 MG tablet, Take by mouth., Disp: , Rfl:  .  divalproex (DEPAKOTE ER) 500 MG 24 hr tablet, Take by mouth., Disp: , Rfl:  .  esomeprazole (NEXIUM) 40 MG capsule, Take by mouth., Disp: , Rfl:  .  fexofenadine (ALLEGRA) 180 MG tablet, Take 180 mg by mouth., Disp: , Rfl:  .  Fluticasone-Salmeterol (ADVAIR) 250-50 MCG/DOSE AEPB, Inhale into the lungs., Disp: , Rfl:  .  HYDROcodone-acetaminophen (NORCO/VICODIN) 5-325 MG tablet, TK 1-2 TS PO Q 4 H PRN FOR PAIN, Disp: , Rfl: 0 .  hydroxychloroquine (PLAQUENIL) 200 MG tablet, Take by mouth., Disp: , Rfl:  .  levothyroxine (SYNTHROID, LEVOTHROID) 50 MCG tablet, TK 1 T PO D FOR  HYPOTHYROIDISM, Disp: , Rfl:  .  lisinopril-hydrochlorothiazide (PRINZIDE,ZESTORETIC) 20-25 MG per tablet, Take by mouth., Disp: , Rfl:  .  lovastatin (MEVACOR) 10 MG tablet, Take by mouth., Disp: , Rfl:  .  montelukast (SINGULAIR) 10 MG tablet, Take by mouth., Disp: , Rfl:  .  nabumetone (RELAFEN) 500 MG tablet, Take by mouth., Disp: , Rfl:  .  promethazine (PHENERGAN) 25 MG tablet, Take 25 mg by mouth., Disp: , Rfl:  .  pyridostigmine (MESTINON) 60 MG tablet, 1/2 tablet twice a day for 2 weeks then 1 tab twice a day, Disp: , Rfl:  .  sertraline (ZOLOFT) 100 MG tablet, Take by mouth., Disp: , Rfl:  .  sulfamethoxazole-trimethoprim (BACTRIM DS,SEPTRA DS) 800-160 MG tablet, TK 1 T PO BID FOR 10 DAYS, Disp: , Rfl: 0 .  fluticasone (FLONASE) 50 MCG/ACT nasal spray, Place into the nose., Disp: , Rfl:   PAST MEDICAL HISTORY: Past Medical History  Diagnosis Date  . Hearing loss   . Hypertension   . Memory loss   . Vision abnormalities     PAST SURGICAL HISTORY: Past Surgical History  Procedure Laterality Date  . Vesicovaginal fistula closure w/ tah    .  Back surgery    . Neck surgery    . Carpal tunnel release Right     FAMILY HISTORY: Family History  Problem Relation Age of Onset  . Diabetes Mother   . Stroke Mother   . COPD Father     SOCIAL HISTORY:  Social History   Social History  . Marital Status: Married    Spouse Name: N/A  . Number of Children: N/A  . Years of Education: N/A   Occupational History  . Not on file.   Social History Main Topics  . Smoking status: Former Research scientist (life sciences)  . Smokeless tobacco: Not on file  . Alcohol Use: 0.0 oz/week    0 Standard drinks or equivalent per week     Comment: occasional wine or beer./fim  . Drug Use: No  . Sexual Activity: Not on file   Other Topics Concern  . Not on file   Social History Narrative     PHYSICAL EXAM  Filed Vitals:   11/25/14 1037  BP: 108/58  Pulse: 70  Resp: 16  Height: 5' 9.5" (1.765 m)   Weight: 227 lb 6.4 oz (103.148 kg)    Body mass index is 33.11 kg/(m^2).   General: The patient is well-developed and well-nourished and in no acute distress  Neck: The neck is supple, no carotid bruits are noted.  The neck is nontender.  Musculoskeletal:  Back is nontender  Neurologic Exam  Mental status: The patient is alert and oriented x 3 at the time of the examination. The patient has apparent normal  remote but poor recent memory (1/3 after 3 minutes; 3/3 after prompt), with an apparently normal attention span and concentration ability ([id number]; WORLD-DLROW).    Speech is normal.  Cranial nerves: Extraocular movements are full. Pupils are equal, round, and reactive to light and accomodation.  Visual fields are full.  Facial symmetry is present. There is good facial sensation to soft touch bilaterally.Facial strength is normal.  Trapezius and sternocleidomastoid strength is normal. No dysarthria is noted.  The tongue is midline, and the patient has symmetric elevation of the soft palate. No obvious hearing deficits are noted.  Motor:  Muscle bulk is normal.   Tone is normal. Strength is  5 / 5 in all 4 extremities except prox leg (4- to 4/5). Functionally, she can raise three times from chair without using at least one arm.   Sensory: Sensory testing is intact to pinprick, soft touch and vibration sensation in all 4 extremities.  Coordination: Cerebellar testing reveals good finger-nose-finger.  Gait and station: Station is normal.   Gait is arthritic. Tandem gait is wide. Romberg is negative.   Reflexes: Deep tendon reflexes are symmetric and she has spread at her knees and normal DTRs elsewhere   Plantar responses are flexor.    DIAGNOSTIC DATA (LABS, IMAGING, TESTING) - I reviewed patient records, labs, notes, testing and imaging myself where available.  Lab Results  Component Value Date   WBC 6.9 09/24/2008   HGB 13.3 12/22/2009   HCT 39.0 12/22/2009   MCV  89.2 09/24/2008   PLT 168 09/24/2008      Component Value Date/Time   NA 141 12/22/2009 1310   K 4.5 12/22/2009 1310   CL 106 09/24/2008 1025   CO2 30 09/24/2008 1025   GLUCOSE 79 12/22/2009 1310   BUN 14 09/24/2008 1025   CREATININE 0.93 09/24/2008 1025   CALCIUM 9.7 09/24/2008 1025   GFRNONAA >60 09/24/2008 1025   GFRAA  09/24/2008  1025    >60        The eGFR has been calculated using the MDRD equation. This calculation has not been validated in all clinical situations. eGFR's persistently <60 mL/min signify possible Chronic Kidney Disease.       ASSESSMENT AND PLAN  Proximal leg weakness - Plan: Comprehensive metabolic panel, Vitamin T91, Methylmalonic acid, serum  Proximal limb weakness - Plan: Comprehensive metabolic panel, Vitamin R04, Methylmalonic acid, serum  Disturbed cognition - Plan: Comprehensive metabolic panel, Vitamin H36, Methylmalonic acid, serum  Insomnia  Numbness - Plan: Comprehensive metabolic panel, Vitamin C38, Methylmalonic acid, serum   1.     Her proximal weakness actually appeared to be mildly better today as she was able to get up out of the chair 3 times without using her arms (barely did one last time). We will check some additional lab work.   She will remain off Mestinon.     Her MRIs do not show any spinal cord pathology. 2.    Memory problems appear to be mostly decreased focus.   She is sleeping better and that may have helped mildly. I will go ahead and also check some lab work 3.    Continue other medications.  Return to clinic in 1 year or sooner if there are new or worsening neurologic symptoms.   Richard A. Felecia Shelling, MD, PhD 37/07/9394, 88:64 AM Certified in Neurology, Clinical Neurophysiology, Sleep Medicine, Pain Medicine and Neuroimaging  [facility] [address] [place], [place] [address] [phone]

## 2014-11-27 LAB — COMPREHENSIVE METABOLIC PANEL
ALBUMIN: 3.8 g/dL (ref 3.6–4.8)
ALT: 11 IU/L (ref 0–32)
AST: 18 IU/L (ref 0–40)
Albumin/Globulin Ratio: 1.2 (ref 1.1–2.5)
Alkaline Phosphatase: 56 IU/L (ref 39–117)
BILIRUBIN TOTAL: 0.3 mg/dL (ref 0.0–1.2)
BUN / CREAT RATIO: 24 (ref 11–26)
BUN: 20 mg/dL (ref 8–27)
CALCIUM: 9.5 mg/dL (ref 8.7–10.3)
CHLORIDE: 103 mmol/L (ref 97–106)
CO2: 28 mmol/L (ref 18–29)
CREATININE: 0.83 mg/dL (ref 0.57–1.00)
GFR calc non Af Amer: 75 mL/min/{1.73_m2} (ref >59)
GFR, EST AFRICAN AMERICAN: 86 mL/min/{1.73_m2} (ref >59)
GLUCOSE: 80 mg/dL (ref 65–99)
Globulin, Total: 3.2 g/dL (ref 1.5–4.5)
Potassium: 5.2 mmol/L (ref 3.5–5.2)
Sodium: 142 mmol/L (ref 136–144)
TOTAL PROTEIN: 7 g/dL (ref 6.0–8.5)

## 2014-11-27 LAB — VITAMIN B12: Vitamin B-12: 902 pg/mL (ref 211–946)

## 2014-11-27 LAB — METHYLMALONIC ACID, SERUM: Methylmalonic Acid: 134 nmol/L (ref 0–378)

## 2014-11-28 ENCOUNTER — Telehealth: Payer: Self-pay | Admitting: *Deleted

## 2014-11-28 NOTE — Telephone Encounter (Signed)
I have spoken with Elease Hashimotoatricia, and per RAS, advised that labs look ok.  She verbalized understanding of same/fim

## 2014-11-28 NOTE — Telephone Encounter (Signed)
-----   Message from Asa Lenteichard A Sater, MD sent at 11/28/2014  2:24 PM EST ----- Please let her know that the labs are okay.

## 2014-12-05 ENCOUNTER — Other Ambulatory Visit: Payer: Self-pay | Admitting: Otolaryngology

## 2014-12-05 DIAGNOSIS — K1121 Acute sialoadenitis: Secondary | ICD-10-CM

## 2014-12-11 ENCOUNTER — Ambulatory Visit
Admission: RE | Admit: 2014-12-11 | Discharge: 2014-12-11 | Disposition: A | Discharge status: Home / Self Care | Source: Ambulatory Visit | Attending: Otolaryngology | Admitting: Otolaryngology

## 2014-12-11 DIAGNOSIS — K1121 Acute sialoadenitis: Secondary | ICD-10-CM

## 2014-12-11 HISTORY — DX: Unspecified asthma, uncomplicated: J45.909

## 2014-12-11 MED ORDER — IOHEXOL 300 MG/ML  SOLN
75.0000 mL | Freq: Once | INTRAMUSCULAR | Status: DC | PRN
Start: 2014-12-11 — End: 2014-12-12

## 2014-12-17 ENCOUNTER — Inpatient Hospital Stay: Admission: RE | Admit: 2014-12-17 | Source: Ambulatory Visit

## 2014-12-18 ENCOUNTER — Encounter
Admission: RE | Admit: 2014-12-18 | Discharge: 2014-12-18 | Disposition: A | Discharge status: Home / Self Care | Source: Ambulatory Visit | Attending: Otolaryngology | Admitting: Otolaryngology

## 2014-12-18 ENCOUNTER — Ambulatory Visit: Admit: 2014-12-18 | Payer: Self-pay | Admitting: Otolaryngology

## 2014-12-18 HISTORY — DX: Bipolar disorder, unspecified: F31.9

## 2014-12-18 HISTORY — DX: Unspecified osteoarthritis, unspecified site: M19.90

## 2014-12-18 HISTORY — DX: Hypothyroidism, unspecified: E03.9

## 2014-12-18 HISTORY — DX: Gastro-esophageal reflux disease without esophagitis: K21.9

## 2014-12-18 HISTORY — DX: Sjogren syndrome, unspecified: M35.00

## 2014-12-18 SURGERY — MINOR EXCISION OF ORAL LESION
Anesthesia: General

## 2014-12-18 NOTE — Patient Instructions (Signed)
  Your procedure is scheduled on:12/19/2014 Report to Day Surgery. 2nd floor medical entrance at 9 am To find out your arrival time please call [phone] between 1PM - 3PM on xxx.  Remember: Instructions that are not followed completely may result in serious medical risk, up to and including death, or upon the discretion of your surgeon and anesthesiologist your surgery may need to be rescheduled.    __x__ 1. Do not eat food or drink liquids after midnight. No gum chewing or hard candies.     __x__ 2. No Alcohol for 24 hours before or after surgery.   ____ 3. Bring all medications with you on the day of surgery if instructed.    __x__ 4. Notify your doctor if there is any change in your medical condition     (cold, fever, infections).     Do not wear jewelry, make-up, hairpins, clips or nail polish.  Do not wear lotions, powders, or perfumes. You may wear deodorant.  Do not shave 48 hours prior to surgery. Men may shave face and neck.  Do not bring valuables to the hospital.    [facility] is not responsible for any belongings or valuables.               Contacts, dentures or bridgework may not be worn into surgery.  Leave your suitcase in the car. After surgery it may be brought to your room.  For patients admitted to the hospital, discharge time is determined by your                treatment team.   Patients discharged the day of surgery will not be allowed to drive home.   Please read over the following fact sheets that you were given:   Surgical Site Infection Prevention   __x__ Take these medicines the morning of surgery with A SIP OF WATER:    1. norvasc  2. depakote  3. nexium  4. levothyroxine  5. sertraline  6.  ____ Fleet Enema (as directed)   ____ Use CHG Soap as directed  _x___ Use inhalers on the day of surgery advair  ____ Stop metformin 2 days prior to surgery    ____ Take 1/2 of usual insulin dose the night before surgery and none on the morning of  surgery.   __x__ Stop Coumadin/Plavix/aspirin on today  __x Stop Anti-inflammatories on today   ____ Stop supplements until after surgery.    ____ Bring C-Pap to the hospital.

## 2014-12-19 ENCOUNTER — Ambulatory Visit
Admission: RE | Admit: 2014-12-19 | Discharge: 2014-12-19 | Disposition: A | Discharge status: Home / Self Care | Source: Ambulatory Visit | Attending: Otolaryngology | Admitting: Otolaryngology

## 2014-12-19 ENCOUNTER — Encounter: Payer: Self-pay | Admitting: Anesthesiology

## 2014-12-19 ENCOUNTER — Encounter
Admission: RE | Disposition: A | Discharge status: Home / Self Care | Payer: Self-pay | Source: Ambulatory Visit | Attending: Otolaryngology

## 2014-12-19 ENCOUNTER — Ambulatory Visit: Admitting: Anesthesiology

## 2014-12-19 DIAGNOSIS — K115 Sialolithiasis: Secondary | ICD-10-CM | POA: Diagnosis present

## 2014-12-19 DIAGNOSIS — K760 Fatty (change of) liver, not elsewhere classified: Secondary | ICD-10-CM | POA: Insufficient documentation

## 2014-12-19 DIAGNOSIS — J45909 Unspecified asthma, uncomplicated: Secondary | ICD-10-CM | POA: Insufficient documentation

## 2014-12-19 DIAGNOSIS — K1123 Chronic sialoadenitis: Secondary | ICD-10-CM | POA: Insufficient documentation

## 2014-12-19 DIAGNOSIS — Z79899 Other long term (current) drug therapy: Secondary | ICD-10-CM | POA: Diagnosis not present

## 2014-12-19 DIAGNOSIS — G7000 Myasthenia gravis without (acute) exacerbation: Secondary | ICD-10-CM | POA: Diagnosis not present

## 2014-12-19 DIAGNOSIS — K1120 Sialoadenitis, unspecified: Secondary | ICD-10-CM | POA: Diagnosis not present

## 2014-12-19 DIAGNOSIS — Z823 Family history of stroke: Secondary | ICD-10-CM | POA: Insufficient documentation

## 2014-12-19 DIAGNOSIS — K219 Gastro-esophageal reflux disease without esophagitis: Secondary | ICD-10-CM | POA: Diagnosis not present

## 2014-12-19 DIAGNOSIS — F319 Bipolar disorder, unspecified: Secondary | ICD-10-CM | POA: Insufficient documentation

## 2014-12-19 DIAGNOSIS — I10 Essential (primary) hypertension: Secondary | ICD-10-CM | POA: Diagnosis not present

## 2014-12-19 DIAGNOSIS — Z881 Allergy status to other antibiotic agents status: Secondary | ICD-10-CM | POA: Diagnosis not present

## 2014-12-19 DIAGNOSIS — Z90710 Acquired absence of both cervix and uterus: Secondary | ICD-10-CM | POA: Diagnosis not present

## 2014-12-19 DIAGNOSIS — Z9842 Cataract extraction status, left eye: Secondary | ICD-10-CM | POA: Insufficient documentation

## 2014-12-19 DIAGNOSIS — M3500 Sicca syndrome, unspecified: Secondary | ICD-10-CM | POA: Insufficient documentation

## 2014-12-19 DIAGNOSIS — Z9841 Cataract extraction status, right eye: Secondary | ICD-10-CM | POA: Insufficient documentation

## 2014-12-19 DIAGNOSIS — Z833 Family history of diabetes mellitus: Secondary | ICD-10-CM | POA: Diagnosis not present

## 2014-12-19 DIAGNOSIS — Z888 Allergy status to other drugs, medicaments and biological substances status: Secondary | ICD-10-CM | POA: Insufficient documentation

## 2014-12-19 DIAGNOSIS — Z880 Allergy status to penicillin: Secondary | ICD-10-CM | POA: Diagnosis not present

## 2014-12-19 DIAGNOSIS — M810 Age-related osteoporosis without current pathological fracture: Secondary | ICD-10-CM | POA: Diagnosis not present

## 2014-12-19 DIAGNOSIS — Z836 Family history of other diseases of the respiratory system: Secondary | ICD-10-CM | POA: Diagnosis not present

## 2014-12-19 DIAGNOSIS — Z87891 Personal history of nicotine dependence: Secondary | ICD-10-CM | POA: Insufficient documentation

## 2014-12-19 DIAGNOSIS — M1990 Unspecified osteoarthritis, unspecified site: Secondary | ICD-10-CM | POA: Diagnosis not present

## 2014-12-19 DIAGNOSIS — Z885 Allergy status to narcotic agent status: Secondary | ICD-10-CM | POA: Insufficient documentation

## 2014-12-19 DIAGNOSIS — Z820 Family history of epilepsy and other diseases of the nervous system: Secondary | ICD-10-CM | POA: Diagnosis not present

## 2014-12-19 HISTORY — PX: EXCISION OF TONGUE LESION: SHX6434

## 2014-12-19 SURGERY — EXCISION, LESION, TONGUE
Anesthesia: General | Wound class: Clean Contaminated

## 2014-12-19 MED ORDER — HYDROCODONE-ACETAMINOPHEN 5-325 MG PO TABS: 1.0000 | ORAL_TABLET | ORAL | Status: DC | PRN

## 2014-12-19 MED ORDER — PHENYLEPHRINE HCL 10 MG/ML IJ SOLN
INTRAMUSCULAR | Status: DC | PRN
  Administered 2014-12-19: 200 ug via INTRAVENOUS

## 2014-12-19 MED ORDER — LACTATED RINGERS IV SOLN
INTRAVENOUS | Status: DC
  Administered 2014-12-19: 10:00:00 via INTRAVENOUS

## 2014-12-19 MED ORDER — LIDOCAINE HCL (CARDIAC) 20 MG/ML IV SOLN
INTRAVENOUS | Status: DC | PRN
  Administered 2014-12-19: 100 mg via INTRAVENOUS

## 2014-12-19 MED ORDER — LIDOCAINE-EPINEPHRINE 1 %-1:100000 IJ SOLN
INTRAMUSCULAR | Status: DC | PRN
  Administered 2014-12-19: 1.5 mL

## 2014-12-19 MED ORDER — FENTANYL CITRATE (PF) 100 MCG/2ML IJ SOLN
25.0000 ug | INTRAMUSCULAR | Status: DC | PRN
  Administered 2014-12-19: 25 ug via INTRAVENOUS

## 2014-12-19 MED ORDER — LABETALOL HCL 5 MG/ML IV SOLN
INTRAVENOUS | Status: DC | PRN
  Administered 2014-12-19: 10 mg via INTRAVENOUS

## 2014-12-19 MED ORDER — SUGAMMADEX SODIUM 200 MG/2ML IV SOLN
INTRAVENOUS | Status: DC | PRN
  Administered 2014-12-19: 200 mg via INTRAVENOUS

## 2014-12-19 MED ORDER — IPRATROPIUM-ALBUTEROL 0.5-2.5 (3) MG/3ML IN SOLN
3.0000 mL | Freq: Once | RESPIRATORY_TRACT | Status: AC
  Administered 2014-12-19: 3 mL via RESPIRATORY_TRACT

## 2014-12-19 MED ORDER — MIDAZOLAM HCL 2 MG/2ML IJ SOLN
INTRAMUSCULAR | Status: DC | PRN
  Administered 2014-12-19: 1 mg via INTRAVENOUS

## 2014-12-19 MED ORDER — ROCURONIUM BROMIDE 100 MG/10ML IV SOLN
INTRAVENOUS | Status: DC | PRN
  Administered 2014-12-19: 35 mg via INTRAVENOUS

## 2014-12-19 MED ORDER — FENTANYL CITRATE (PF) 100 MCG/2ML IJ SOLN
INTRAMUSCULAR | Status: DC | PRN
  Administered 2014-12-19: 100 ug via INTRAVENOUS

## 2014-12-19 MED ORDER — GLYCOPYRROLATE 0.2 MG/ML IJ SOLN
INTRAMUSCULAR | Status: DC | PRN
  Administered 2014-12-19: 0.2 mg via INTRAVENOUS

## 2014-12-19 MED ORDER — IPRATROPIUM-ALBUTEROL 0.5-2.5 (3) MG/3ML IN SOLN
RESPIRATORY_TRACT | Status: AC
  Administered 2014-12-19: 3 mL via RESPIRATORY_TRACT
  Filled 2014-12-19: qty 3

## 2014-12-19 MED ORDER — ONDANSETRON HCL 4 MG/2ML IJ SOLN: 4.0000 mg | Freq: Once | INTRAMUSCULAR | Status: DC | PRN

## 2014-12-19 MED ORDER — LIDOCAINE-EPINEPHRINE 1 %-1:100000 IJ SOLN
INTRAMUSCULAR | Status: AC
  Filled 2014-12-19: qty 1

## 2014-12-19 MED ORDER — PROPOFOL 10 MG/ML IV BOLUS
INTRAVENOUS | Status: DC | PRN
  Administered 2014-12-19: 180 mg via INTRAVENOUS

## 2014-12-19 MED ORDER — FENTANYL CITRATE (PF) 100 MCG/2ML IJ SOLN
INTRAMUSCULAR | Status: AC
  Administered 2014-12-19: 25 ug via INTRAVENOUS
  Filled 2014-12-19: qty 2

## 2014-12-19 MED ORDER — PROMETHAZINE HCL 12.5 MG PO TABS: 12.5000 mg | ORAL_TABLET | Freq: Four times a day (QID) | ORAL | Status: DC | PRN

## 2014-12-19 SURGICAL SUPPLY — 17 items
BLADE SURG 15 STRL LF DISP TIS (BLADE) ×1 IMPLANT
BLADE SURG 15 STRL SS (BLADE) ×2
CANISTER SUCT 1200ML W/VALVE (MISCELLANEOUS) ×3 IMPLANT
COAGULATOR SUCT 8FR VV (MISCELLANEOUS) ×3 IMPLANT
ELECT CAUTERY BLADE 6.4 (BLADE) ×3 IMPLANT
GLOVE BIO SURGEON STRL SZ7.5 (GLOVE) ×3 IMPLANT
GOWN STRL REUS W/ TWL LRG LVL3 (GOWN DISPOSABLE) ×2 IMPLANT
GOWN STRL REUS W/TWL LRG LVL3 (GOWN DISPOSABLE) ×4
KIT RM TURNOVER STRD PROC AR (KITS) ×3 IMPLANT
NS IRRIG 500ML POUR BTL (IV SOLUTION) ×3 IMPLANT
PACK HEAD/NECK (MISCELLANEOUS) ×3 IMPLANT
PAD GROUND ADULT SPLIT (MISCELLANEOUS) ×3 IMPLANT
SUCTION FRAZIER TIP 10 FR DISP (SUCTIONS) ×3 IMPLANT
SUT CHROMIC 5-0 (SUTURE) ×2
SUT CHROMIC 5-0 P2 18XMFL CR (SUTURE) ×1
SUTURE CHRMC 5-0 P2 18XMF CR (SUTURE) ×1 IMPLANT
SYR 3ML LL SCALE MARK (SYRINGE) ×3 IMPLANT

## 2014-12-19 NOTE — H&P (Signed)
..  History and Physical paper copy reviewed and updated date of procedure and will be scanned into system.  

## 2014-12-19 NOTE — Op Note (Signed)
..  12/19/2014  10:36 AM    Colbert Norman, Jessica  [id number]   Pre-Op Dx: Right submandibular sialolithiasis, Right recurrent submandibular sialoadenitis  Post-op Dx: SAME  Proc: 1)  Transoral excision of right submandibular duct stone 2)  Right submandibular sialodochoplasty  Surg: Won Kreuzer  Anes: GOT  EBL: <10ccs  Comp: None  Indications: Right wharton duct stone 6mm oval shaped with dilation of duct and recurrent sialoadenitis  Findings:  Right ductal stone successfully removed trans-orally, no additional stones identified, wide successful sialodochoplasty  Description of Procedure: After the patient was identified in hold and the history and physical and consent was reviewed and updated. The patient was next taken to the operating room and placed in a supine position. General endotracheal anesthesia was induced with laryngeal monitor endotracheal tube. 1.5cc's of 1% lidocaine with 1:100,000 Epinephrine was injected into her floor of mouth on the right side.  A stone was palpated near the punctum of Wharton's duct on the right The patient was next prepped and draped in a sterile normal fashion.  At this time, an 11 blade scalpel was used to open the punctum and the first 1cm of the right Wharton's duct.  This demonstrated a dialted duct and moderate sized sialolith.  This was removed with milking the gland and clear secretions were noted behind the stone.  No additional stones were found and copious milking of the gland was performed.   At this time the sialodochoplasty was performed with 5.0 chromic on a P2 needle creating a large wide opening of the right Wharton's duct.  The patient's oral cavity was cleaned of secretions at this time and care of the patient was transferred to Anesthesia.  At this time the patient was extubated and taken to PACU in good condition.  Plan:Soft diet for 24 hours.  Plenty of hydration.  Follow up in 1  week.  Marshell Rieger  12/19/2014 10:36 AM

## 2014-12-19 NOTE — Anesthesia Procedure Notes (Signed)
Procedure Name: Intubation Date/Time: 12/19/2014 10:15 AM Performed by: Shirlee LimerickMARION, Juhi Lagrange Pre-anesthesia Checklist: Patient identified, Emergency Drugs available, Suction available and Patient being monitored Patient Re-evaluated:Patient Re-evaluated prior to inductionOxygen Delivery Method: Circle system utilized Preoxygenation: Pre-oxygenation with 100% oxygen Intubation Type: IV induction Laryngoscope Size: Mac and 3 Grade View: Grade I Tube type: Oral Tube size: 7.0 mm Number of attempts: 1 Placement Confirmation: ETT inserted through vocal cords under direct vision,  positive ETCO2 and breath sounds checked- equal and bilateral Secured at: 21 cm Tube secured with: Tape Dental Injury: Teeth and Oropharynx as per pre-operative assessment

## 2014-12-19 NOTE — Transfer of Care (Signed)
Immediate Anesthesia Transfer of Care Note  Patient: Jessica Norman  Procedure(s) Performed: Procedure(s): EXCISION OF TONGUE LESION (N/A)  Patient Location: PACU  Anesthesia Type:General  Level of Consciousness: awake, alert , oriented and patient cooperative  Airway & Oxygen Therapy: Patient Spontanous Breathing and Patient connected to nasal cannula oxygen  Post-op Assessment: Report given to RN and Post -op Vital signs reviewed and stable  Post vital signs: Reviewed and stable  Last Vitals:  Filed Vitals:   12/19/14 0903 12/19/14 1057  BP: 149/79 132/73  Pulse: 69 71  Temp: 36.9 C 36.2 C  Resp: 16 20    Complications: No apparent anesthesia complications

## 2014-12-19 NOTE — Anesthesia Preprocedure Evaluation (Signed)
Anesthesia Evaluation  Patient identified by MRN, date of birth, ID band Patient awake    Reviewed: Allergy & Precautions, NPO status , Patient's Chart, lab work & pertinent test results  Airway Mallampati: II  TM Distance: >3 FB Neck ROM: Full    Dental  (+) Partial Lower, Chipped   Pulmonary asthma , former smoker,    Pulmonary exam normal breath sounds clear to auscultation- rhonchi       Cardiovascular hypertension, Pt. on medications Normal cardiovascular exam     Neuro/Psych  Headaches, PSYCHIATRIC DISORDERS Bipolar Disorder Carpal tunnel syndrome  Neuromuscular disease    GI/Hepatic GERD  Medicated and Controlled,Fatty liver   Endo/Other  Hypothyroidism   Renal/GU negative Renal ROS  negative genitourinary   Musculoskeletal  (+) Arthritis , Osteoarthritis,    Abdominal Normal abdominal exam  (+)   Peds negative pediatric ROS (+)  Hematology negative hematology ROS (+)   Anesthesia Other Findings   Reproductive/Obstetrics                             Anesthesia Physical Anesthesia Plan  ASA: III  Anesthesia Plan: General   Post-op Pain Management:    Induction: Intravenous  Airway Management Planned: Oral ETT  Additional Equipment:   Intra-op Plan:   Post-operative Plan:   Informed Consent: I have reviewed the patients History and Physical, chart, labs and discussed the procedure including the risks, benefits and alternatives for the proposed anesthesia with the patient or authorized representative who has indicated his/her understanding and acceptance.   Dental advisory given  Plan Discussed with: CRNA and Surgeon  Anesthesia Plan Comments:         Anesthesia Quick Evaluation

## 2014-12-19 NOTE — Discharge Instructions (Signed)
General Anesthesia, Adult °General anesthesia is a sleep-like state of non-feeling produced by medicines (anesthetics). General anesthesia prevents you from being alert and feeling pain during a medical procedure. Your caregiver may recommend general anesthesia if your procedure: °· Is long. °· Is painful or uncomfortable. °· Would be frightening to see or hear. °· Requires you to be still. °· Affects your breathing. °· Causes significant blood loss. °LET YOUR CAREGIVER KNOW ABOUT: °· Allergies to food or medicine. °· Medicines taken, including vitamins, herbs, eyedrops, over-the-counter medicines, and creams. °· Use of steroids (by mouth or creams). °· Previous problems with anesthetics or numbing medicines, including problems experienced by relatives. °· History of bleeding problems or blood clots. °· Previous surgeries and types of anesthetics received. °· Possibility of pregnancy, if this applies. °· Use of cigarettes, alcohol, or illegal drugs. °· Any health condition(s), especially diabetes, sleep apnea, and high blood pressure. °RISKS AND COMPLICATIONS °General anesthesia rarely causes complications. However, if complications do occur, they can be life threatening. Complications include: °· A lung infection. °· A stroke. °· A heart attack. °· Waking up during the procedure. When this occurs, the patient may be unable to move and communicate that he or she is awake. The patient may feel severe pain. °Older adults and adults with serious medical problems are more likely to have complications than adults who are young and healthy. Some complications can be prevented by answering all of your caregiver's questions thoroughly and by following all pre-procedure instructions. It is important to tell your caregiver if any of the pre-procedure instructions, especially those related to diet, were not followed. Any food or liquid in the stomach can cause problems when you are under general anesthesia. °BEFORE THE  PROCEDURE °· Ask your caregiver if you will have to spend the night at the hospital. If you will not have to spend the night, arrange to have an adult drive you and stay with you for 24 hours. °· Follow your caregiver's instructions if you are taking dietary supplements or medicines. Your caregiver may tell you to stop taking them or to reduce your dosage. °· Do not smoke for as long as possible before your procedure. If possible, stop smoking 3-6 weeks before the procedure. °· Do not take new dietary supplements or medicines within 1 week of your procedure unless your caregiver approves them. °· Do not eat within 8 hours of your procedure or as directed by your caregiver. Drink only clear liquids, such as water, black coffee (without milk or cream), and fruit juices (without pulp). °· Do not drink within 3 hours of your procedure or as directed by your caregiver. °· You may brush your teeth on the morning of the procedure, but make sure to spit out the toothpaste and water when finished. °PROCEDURE  °You will receive anesthetics through a mask, through an intravenous (IV) access tube, or through both. A doctor who specializes in anesthesia (anesthesiologist) or a nurse who specializes in anesthesia (nurse anesthetist) or both will stay with you throughout the procedure to make sure you remain unconscious. He or she will also watch your blood pressure, pulse, and oxygen levels to make sure that the anesthetics do not cause any problems. Once you are asleep, a breathing tube or mask may be used to help you breathe. °AFTER THE PROCEDURE °You will wake up after the procedure is complete. You may be in the room where the procedure was performed or in a recovery area. You may have a sore throat   if a breathing tube was used. You may also feel: °· Dizzy. °· Weak. °· Drowsy. °· Confused. °· Nauseous. °· Cold. °These are all normal responses and can be expected to last for up to 24 hours after the procedure is complete. A  caregiver will tell you when you are ready to go home. This will usually be when you are fully awake and in stable condition. °  °This information is not intended to replace advice given to you by your health care provider. Make sure you discuss any questions you have with your health care provider. °  °Document Released: 04/13/2007 Document Revised: 01/25/2014 Document Reviewed: 05/05/2011 °Elsevier Interactive Patient Education ©2016 [employer] ° °

## 2014-12-20 LAB — SURGICAL PATHOLOGY

## 2014-12-23 NOTE — Anesthesia Postprocedure Evaluation (Signed)
Anesthesia Post Note  Patient: Jessica Norman  Procedure(s) Performed: Procedure(s) (LRB): EXCISION OF TONGUE LESION (N/A)  Patient location during evaluation: PACU Anesthesia Type: General Level of consciousness: awake, awake and alert and oriented Pain management: pain level controlled Vital Signs Assessment: post-procedure vital signs reviewed and stable Respiratory status: spontaneous breathing Cardiovascular status: blood pressure returned to baseline Anesthetic complications: no    Last Vitals:  Filed Vitals:   12/19/14 1240 12/19/14 1318  BP: 151/73 162/81  Pulse: 66 65  Temp: 36.3 C   Resp: 16 18    Last Pain:  Filed Vitals:   12/20/14 0852  PainSc: 0-No pain                 Pelagia Iacobucci

## 2015-01-14 ENCOUNTER — Inpatient Hospital Stay: Admission: RE | Admit: 2015-01-14 | Source: Ambulatory Visit

## 2015-01-14 ENCOUNTER — Encounter: Payer: Self-pay | Admitting: *Deleted

## 2015-01-14 NOTE — Patient Instructions (Signed)
  Your procedure is scheduled on: 01/16/15 Report to Day Surgery. [facility] To find out your arrival time please call [phone] between 1PM - 3PM on 01/15/15.  Remember: Instructions that are not followed completely may result in serious medical risk, up to and including death, or upon the discretion of your surgeon and anesthesiologist your surgery may need to be rescheduled.    _X___ 1. Do not eat food or drink liquids after midnight. No gum chewing or hard candies.     _X___ 2. No Alcohol for 24 hours before or after surgery.   ____ 3. Bring all medications with you on the day of surgery if instructed.    _X___ 4. Notify your doctor if there is any change in your medical condition     (cold, fever, infections).     Do not wear jewelry, make-up, hairpins, clips or nail polish.  Do not wear lotions, powders, or perfumes. You may wear deodorant.  Do not shave 48 hours prior to surgery. Men may shave face and neck.  Do not bring valuables to the hospital.    [facility] is not responsible for any belongings or valuables.               Contacts, dentures or bridgework may not be worn into surgery.  Leave your suitcase in the car. After surgery it may be brought to your room.  For patients admitted to the hospital, discharge time is determined by your                treatment team.   Patients discharged the day of surgery will not be allowed to drive home.   Please read over the following fact sheets that you were given:   Surgical Site Infection Prevention   ____ Take these medicines the morning of surgery with A SIP OF WATER:    1. AMLODIPINE  2. DEXILANT  3. NEXIUM AM OF SURGERY AND BEDTIME NIGHT BEFORE SURGERY  4.GABAPENTIN  5.LEVOTHYROXINE  6.SERTRALINE  ____ Fleet Enema (as directed)   _X___ Use CHG Soap as directed  _X___ Use inhalers on the day of surgery  ____ Stop metformin 2 days prior to surgery    ____ Take 1/2 of usual insulin dose the  night before surgery and none on the morning of surgery.   _X___ Stop Coumadin/Plavix/aspirin on  STOP ASPIRIN NOW _X___ Stop Anti-inflammatories on   STOP RELAFEN AND SULINDAC NOW   ____ Stop supplements until after surgery.    ____ Bring C-Pap to the hospital.

## 2015-01-16 ENCOUNTER — Ambulatory Visit
Admission: RE | Admit: 2015-01-16 | Discharge: 2015-01-16 | Disposition: A | Discharge status: Home / Self Care | Source: Ambulatory Visit | Attending: Surgery | Admitting: Surgery

## 2015-01-16 ENCOUNTER — Ambulatory Visit: Admitting: Anesthesiology

## 2015-01-16 ENCOUNTER — Encounter
Admission: RE | Disposition: A | Discharge status: Home / Self Care | Payer: Self-pay | Source: Ambulatory Visit | Attending: Surgery

## 2015-01-16 ENCOUNTER — Encounter: Payer: Self-pay | Admitting: *Deleted

## 2015-01-16 DIAGNOSIS — G629 Polyneuropathy, unspecified: Secondary | ICD-10-CM | POA: Insufficient documentation

## 2015-01-16 DIAGNOSIS — H409 Unspecified glaucoma: Secondary | ICD-10-CM | POA: Insufficient documentation

## 2015-01-16 DIAGNOSIS — M19012 Primary osteoarthritis, left shoulder: Secondary | ICD-10-CM | POA: Insufficient documentation

## 2015-01-16 DIAGNOSIS — Z79899 Other long term (current) drug therapy: Secondary | ICD-10-CM | POA: Insufficient documentation

## 2015-01-16 DIAGNOSIS — D649 Anemia, unspecified: Secondary | ICD-10-CM | POA: Insufficient documentation

## 2015-01-16 DIAGNOSIS — Z90710 Acquired absence of both cervix and uterus: Secondary | ICD-10-CM | POA: Diagnosis not present

## 2015-01-16 DIAGNOSIS — H918X9 Other specified hearing loss, unspecified ear: Secondary | ICD-10-CM | POA: Diagnosis not present

## 2015-01-16 DIAGNOSIS — M7542 Impingement syndrome of left shoulder: Secondary | ICD-10-CM | POA: Insufficient documentation

## 2015-01-16 DIAGNOSIS — L309 Dermatitis, unspecified: Secondary | ICD-10-CM | POA: Diagnosis not present

## 2015-01-16 DIAGNOSIS — M75102 Unspecified rotator cuff tear or rupture of left shoulder, not specified as traumatic: Secondary | ICD-10-CM | POA: Diagnosis not present

## 2015-01-16 DIAGNOSIS — E669 Obesity, unspecified: Secondary | ICD-10-CM | POA: Diagnosis not present

## 2015-01-16 DIAGNOSIS — Z820 Family history of epilepsy and other diseases of the nervous system: Secondary | ICD-10-CM | POA: Insufficient documentation

## 2015-01-16 DIAGNOSIS — Z87891 Personal history of nicotine dependence: Secondary | ICD-10-CM | POA: Diagnosis not present

## 2015-01-16 DIAGNOSIS — M3500 Sicca syndrome, unspecified: Secondary | ICD-10-CM | POA: Diagnosis not present

## 2015-01-16 DIAGNOSIS — Z823 Family history of stroke: Secondary | ICD-10-CM | POA: Insufficient documentation

## 2015-01-16 DIAGNOSIS — Z8249 Family history of ischemic heart disease and other diseases of the circulatory system: Secondary | ICD-10-CM | POA: Insufficient documentation

## 2015-01-16 DIAGNOSIS — M94261 Chondromalacia, right knee: Secondary | ICD-10-CM | POA: Diagnosis not present

## 2015-01-16 DIAGNOSIS — M7522 Bicipital tendinitis, left shoulder: Secondary | ICD-10-CM | POA: Diagnosis not present

## 2015-01-16 DIAGNOSIS — M94262 Chondromalacia, left knee: Secondary | ICD-10-CM | POA: Diagnosis not present

## 2015-01-16 DIAGNOSIS — Z882 Allergy status to sulfonamides status: Secondary | ICD-10-CM | POA: Diagnosis not present

## 2015-01-16 DIAGNOSIS — F319 Bipolar disorder, unspecified: Secondary | ICD-10-CM | POA: Diagnosis not present

## 2015-01-16 DIAGNOSIS — E785 Hyperlipidemia, unspecified: Secondary | ICD-10-CM | POA: Diagnosis not present

## 2015-01-16 DIAGNOSIS — Z880 Allergy status to penicillin: Secondary | ICD-10-CM | POA: Diagnosis not present

## 2015-01-16 DIAGNOSIS — M1990 Unspecified osteoarthritis, unspecified site: Secondary | ICD-10-CM | POA: Diagnosis not present

## 2015-01-16 DIAGNOSIS — G4730 Sleep apnea, unspecified: Secondary | ICD-10-CM | POA: Insufficient documentation

## 2015-01-16 DIAGNOSIS — E039 Hypothyroidism, unspecified: Secondary | ICD-10-CM | POA: Diagnosis not present

## 2015-01-16 DIAGNOSIS — Z7982 Long term (current) use of aspirin: Secondary | ICD-10-CM | POA: Diagnosis not present

## 2015-01-16 DIAGNOSIS — Z6833 Body mass index (BMI) 33.0-33.9, adult: Secondary | ICD-10-CM | POA: Diagnosis not present

## 2015-01-16 DIAGNOSIS — J45909 Unspecified asthma, uncomplicated: Secondary | ICD-10-CM | POA: Diagnosis not present

## 2015-01-16 DIAGNOSIS — Z825 Family history of asthma and other chronic lower respiratory diseases: Secondary | ICD-10-CM | POA: Diagnosis not present

## 2015-01-16 DIAGNOSIS — Z885 Allergy status to narcotic agent status: Secondary | ICD-10-CM | POA: Diagnosis not present

## 2015-01-16 DIAGNOSIS — K219 Gastro-esophageal reflux disease without esophagitis: Secondary | ICD-10-CM | POA: Diagnosis not present

## 2015-01-16 DIAGNOSIS — I10 Essential (primary) hypertension: Secondary | ICD-10-CM | POA: Diagnosis not present

## 2015-01-16 DIAGNOSIS — R51 Headache: Secondary | ICD-10-CM | POA: Insufficient documentation

## 2015-01-16 HISTORY — PX: SHOULDER ARTHROSCOPY WITH OPEN ROTATOR CUFF REPAIR: SHX6092

## 2015-01-16 SURGERY — ARTHROSCOPY, SHOULDER WITH REPAIR, ROTATOR CUFF, OPEN
Anesthesia: Regional | Site: Shoulder | Laterality: Left | Wound class: Clean

## 2015-01-16 MED ORDER — PROMETHAZINE HCL 25 MG/ML IJ SOLN: 6.2500 mg | INTRAMUSCULAR | Status: DC | PRN

## 2015-01-16 MED ORDER — EPINEPHRINE HCL 1 MG/ML IJ SOLN
INTRAMUSCULAR | Status: AC
  Filled 2015-01-16: qty 2

## 2015-01-16 MED ORDER — ONDANSETRON HCL 4 MG PO TABS: 4.0000 mg | ORAL_TABLET | Freq: Four times a day (QID) | ORAL | Status: DC | PRN

## 2015-01-16 MED ORDER — ACETAMINOPHEN 10 MG/ML IV SOLN
INTRAVENOUS | Status: DC | PRN
  Administered 2015-01-16: 1000 mg via INTRAVENOUS

## 2015-01-16 MED ORDER — PROPOFOL 10 MG/ML IV BOLUS
INTRAVENOUS | Status: DC | PRN
Start: 2015-01-16 — End: 2015-01-16
  Administered 2015-01-16: 150 mg via INTRAVENOUS

## 2015-01-16 MED ORDER — CLINDAMYCIN PHOSPHATE 900 MG/50ML IV SOLN
INTRAVENOUS | Status: AC
  Administered 2015-01-16: 900 mg via INTRAVENOUS
  Filled 2015-01-16: qty 50

## 2015-01-16 MED ORDER — PHENYLEPHRINE HCL 10 MG/ML IJ SOLN
INTRAMUSCULAR | Status: DC | PRN
  Administered 2015-01-16: 100 ug via INTRAVENOUS

## 2015-01-16 MED ORDER — FENTANYL CITRATE (PF) 100 MCG/2ML IJ SOLN: 25.0000 ug | INTRAMUSCULAR | Status: DC | PRN

## 2015-01-16 MED ORDER — BUPIVACAINE-EPINEPHRINE 0.5% -1:200000 IJ SOLN
INTRAMUSCULAR | Status: DC | PRN
  Administered 2015-01-16: 20 mL

## 2015-01-16 MED ORDER — PHENYLEPHRINE HCL 10 MG/ML IJ SOLN
10.0000 mg | INTRAMUSCULAR | Status: DC | PRN
  Administered 2015-01-16: 25 ug/min via INTRAVENOUS

## 2015-01-16 MED ORDER — ONDANSETRON HCL 4 MG/2ML IJ SOLN: 4.0000 mg | Freq: Four times a day (QID) | INTRAMUSCULAR | Status: DC | PRN

## 2015-01-16 MED ORDER — POTASSIUM CHLORIDE IN NACL 20-0.9 MEQ/L-% IV SOLN
INTRAVENOUS | Status: DC
  Filled 2015-01-16 (×3): qty 1000

## 2015-01-16 MED ORDER — CHLORHEXIDINE GLUCONATE 4 % EX LIQD: 60.0000 mL | Freq: Once | CUTANEOUS | Status: DC

## 2015-01-16 MED ORDER — MIDAZOLAM HCL 2 MG/2ML IJ SOLN
INTRAMUSCULAR | Status: DC | PRN
  Administered 2015-01-16: 1 mg via INTRAVENOUS

## 2015-01-16 MED ORDER — EPHEDRINE SULFATE 50 MG/ML IJ SOLN
INTRAMUSCULAR | Status: DC | PRN
  Administered 2015-01-16: 10 mg via INTRAVENOUS

## 2015-01-16 MED ORDER — METOCLOPRAMIDE HCL 10 MG PO TABS: 5.0000 mg | ORAL_TABLET | Freq: Three times a day (TID) | ORAL | Status: DC | PRN

## 2015-01-16 MED ORDER — ROPIVACAINE HCL 5 MG/ML IJ SOLN
INTRAMUSCULAR | Status: AC
  Filled 2015-01-16: qty 40

## 2015-01-16 MED ORDER — BUPIVACAINE-EPINEPHRINE (PF) 0.5% -1:200000 IJ SOLN
INTRAMUSCULAR | Status: AC
  Filled 2015-01-16: qty 30

## 2015-01-16 MED ORDER — ROCURONIUM BROMIDE 100 MG/10ML IV SOLN
INTRAVENOUS | Status: DC | PRN
  Administered 2015-01-16: 50 mg via INTRAVENOUS

## 2015-01-16 MED ORDER — GLYCOPYRROLATE 0.2 MG/ML IJ SOLN
INTRAMUSCULAR | Status: DC | PRN
  Administered 2015-01-16: .4 mg via INTRAVENOUS
  Administered 2015-01-16: 0.2 mg via INTRAVENOUS

## 2015-01-16 MED ORDER — LACTATED RINGERS IV SOLN
INTRAVENOUS | Status: DC
  Administered 2015-01-16: 07:00:00 via INTRAVENOUS

## 2015-01-16 MED ORDER — EPINEPHRINE HCL 1 MG/ML IJ SOLN
INTRAMUSCULAR | Status: DC | PRN
  Administered 2015-01-16: 4 mL

## 2015-01-16 MED ORDER — EPINEPHRINE HCL 1 MG/ML IJ SOLN
INTRAMUSCULAR | Status: AC
  Filled 2015-01-16: qty 4

## 2015-01-16 MED ORDER — LIDOCAINE HCL (CARDIAC) 20 MG/ML IV SOLN
INTRAVENOUS | Status: DC | PRN
  Administered 2015-01-16: 40 mg via INTRAVENOUS

## 2015-01-16 MED ORDER — NEOSTIGMINE METHYLSULFATE 10 MG/10ML IV SOLN
INTRAVENOUS | Status: DC | PRN
  Administered 2015-01-16: 3 mg via INTRAVENOUS

## 2015-01-16 MED ORDER — OXYCODONE HCL 5 MG PO TABS: 5.0000 mg | ORAL_TABLET | ORAL | Status: DC | PRN

## 2015-01-16 MED ORDER — ONDANSETRON HCL 4 MG/2ML IJ SOLN
INTRAMUSCULAR | Status: DC | PRN
  Administered 2015-01-16: 4 mg via INTRAVENOUS

## 2015-01-16 MED ORDER — FENTANYL CITRATE (PF) 100 MCG/2ML IJ SOLN
INTRAMUSCULAR | Status: DC | PRN
  Administered 2015-01-16: 100 ug via INTRAVENOUS
  Administered 2015-01-16: 150 ug via INTRAVENOUS

## 2015-01-16 MED ORDER — CLINDAMYCIN PHOSPHATE 900 MG/50ML IV SOLN: 900.0000 mg | INTRAVENOUS | Status: DC

## 2015-01-16 MED ORDER — DEXAMETHASONE SODIUM PHOSPHATE 4 MG/ML IJ SOLN
INTRAMUSCULAR | Status: DC | PRN
  Administered 2015-01-16: 5 mg via INTRAVENOUS

## 2015-01-16 MED ORDER — ACETAMINOPHEN 10 MG/ML IV SOLN
INTRAVENOUS | Status: AC
  Filled 2015-01-16: qty 100

## 2015-01-16 MED ORDER — LIDOCAINE HCL (PF) 4 % IJ SOLN
INTRAMUSCULAR | Status: DC | PRN
  Administered 2015-01-16: 5 mL via RESPIRATORY_TRACT

## 2015-01-16 MED ORDER — METOCLOPRAMIDE HCL 5 MG/ML IJ SOLN: 5.0000 mg | Freq: Three times a day (TID) | INTRAMUSCULAR | Status: DC | PRN

## 2015-01-16 SURGICAL SUPPLY — 46 items
BIT DRILL JUGRKNT W/NDL BIT2.9 (DRILL) ×2 IMPLANT
BLADE FULL RADIUS 3.5 (BLADE) ×3 IMPLANT
BLADE SHAVER 4.5X7 STR FR (MISCELLANEOUS) ×3 IMPLANT
BUR ACROMIONIZER 4.0 (BURR) ×3 IMPLANT
BUR BR 5.5 WIDE MOUTH (BURR) IMPLANT
CANNULA 8.5X75 THRED (CANNULA) IMPLANT
CANNULA SHAVER 8MMX76MM (CANNULA) ×3 IMPLANT
CHLORAPREP W/TINT 26ML (MISCELLANEOUS) ×6 IMPLANT
COVER MAYO STAND STRL (DRAPES) ×3 IMPLANT
DRAPE IMP U-DRAPE 54X76 (DRAPES) ×3 IMPLANT
DRAPE SURG 17X11 SM STRL (DRAPES) ×3 IMPLANT
DRILL JUGGERKNOT W/NDL BIT 2.9 (DRILL) ×6
DRSG OPSITE POSTOP 4X8 (GAUZE/BANDAGES/DRESSINGS) ×3 IMPLANT
GAUZE PETRO XEROFOAM 1X8 (MISCELLANEOUS) ×3 IMPLANT
GAUZE SPONGE 4X4 12PLY STRL (GAUZE/BANDAGES/DRESSINGS) ×3 IMPLANT
GLOVE BIO SURGEON STRL SZ7.5 (GLOVE) ×6 IMPLANT
GLOVE BIO SURGEON STRL SZ8 (GLOVE) ×6 IMPLANT
GLOVE BIOGEL PI IND STRL 8 (GLOVE) ×1 IMPLANT
GLOVE BIOGEL PI INDICATOR 8 (GLOVE) ×2
GLOVE INDICATOR 8.0 STRL GRN (GLOVE) ×3 IMPLANT
GOWN STRL REUS W/ TWL LRG LVL3 (GOWN DISPOSABLE) ×2 IMPLANT
GOWN STRL REUS W/ TWL XL LVL3 (GOWN DISPOSABLE) ×1 IMPLANT
GOWN STRL REUS W/TWL LRG LVL3 (GOWN DISPOSABLE) ×4
GOWN STRL REUS W/TWL XL LVL3 (GOWN DISPOSABLE) ×2
GRASPER SUT 15 45D LOW PRO (SUTURE) IMPLANT
IV LACTATED RINGER IRRG 3000ML (IV SOLUTION) ×4
IV LR IRRIG 3000ML ARTHROMATIC (IV SOLUTION) ×2 IMPLANT
MANIFOLD NEPTUNE II (INSTRUMENTS) ×3 IMPLANT
MASK FACE SPIDER DISP (MASK) ×3 IMPLANT
MAT BLUE FLOOR 46X72 FLO (MISCELLANEOUS) ×3 IMPLANT
NEEDLE REVERSE CUT 1/2 CRC (NEEDLE) IMPLANT
PACK ARTHROSCOPY SHOULDER (MISCELLANEOUS) ×3 IMPLANT
PAD GROUND ADULT SPLIT (MISCELLANEOUS) ×3 IMPLANT
SLING ARM LRG DEEP (SOFTGOODS) ×3 IMPLANT
SLING ULTRA II LG (MISCELLANEOUS) ×3 IMPLANT
STAPLER SKIN PROX 35W (STAPLE) ×3 IMPLANT
STRAP SAFETY BODY (MISCELLANEOUS) ×3 IMPLANT
SUT ETHIBOND 0 MO6 C/R (SUTURE) ×3 IMPLANT
SUT PROLENE 4 0 PS 2 18 (SUTURE) ×3 IMPLANT
SUT VIC AB 2-0 CT1 27 (SUTURE) ×6
SUT VIC AB 2-0 CT1 TAPERPNT 27 (SUTURE) ×2 IMPLANT
TAPE MICROFOAM 4IN (TAPE) ×3 IMPLANT
TUBING ARTHRO INFLOW-ONLY STRL (TUBING) ×3 IMPLANT
TUBING CONNECTING 10 (TUBING) ×2 IMPLANT
TUBING CONNECTING 10' (TUBING) ×1
WAND HAND CNTRL MULTIVAC 90 (MISCELLANEOUS) ×3 IMPLANT

## 2015-01-16 NOTE — Discharge Instructions (Addendum)
Keep dressing dry and intact.  °May shower after dressing changed on post-op day #4 (Monday).  °Cover staples with Band-Aids after drying off. °Apply ice frequently to shoulder. °Keep shoulder immobilizer on at all times except may remove for bathing purposes. °Follow-up in 10-14 days or as scheduled. °

## 2015-01-16 NOTE — Op Note (Signed)
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  Procedure: Extensive arthroscopic debridement, abrasion chondroplasty with microfracture of glenoid, arthroscopic subacromial decompression, arthroscopic excision of distal clavicle, mini-open rotator cuff repair, and mini-open biceps tenodesis, left shoulder.  Anesthesia: General endotracheal with interscalene block placed preoperatively by the anesthesiologist.  Surgeon:   Maryagnes Amos, MD  Assistant:   Joaquim Nam, RNFA  Findings: As above. There were extensive grade 3-4 chondromalacial changes involving the glenoid, and extensive grade 3 chondral malacia changes involving the humerus. There was some degenerative fraying of the labrum without frank detachment. The long head of the biceps tendon demonstrated a longitudinal split tear of the mid-articular portion. There was an area of significant tendinopathic changes involving the anterior insertional fibers of the supraspinatus with a focal near full thickness tear.  Complications: None  Fluids:   650 cc  Estimated blood loss: 10 cc  Tourniquet time: None  Drains: None  Closure: Staples   Brief clinical note: The patient is a 64 year old female with a history of gradually worsening left shoulder pain. The patient's symptoms have progressed recently despite medications, activity modification, etc. The patient's history and examination are consistent with impingement/tendinopathy with an at least partial thickness rotator cuff tear. These findings were confirmed by MRI scan. The MRI scan also demonstrated evidence of degenerative changes of  both the glenohumeral and acromioclavicular joints, as well as biceps tendinopathy. The patient presents at this time for definitive management of these shoulder symptoms.  Procedure: The patient was brought into the operating room and lain in the supine position. After adequate IV sedation was achieved, the patient underwent placement of an interscalene block by the anesthesiologist. The patient then underwent general endotracheal intubation and anesthesia before being repositioned in the beach chair position using the beach chair positioner. The left shoulder and upper extremity were prepped with ChloraPrep solution before being draped sterilely. Preoperative antibiotics were administered. A timeout was performed to confirm the proper surgical site before the expected portal sites and incision site were injected with 0.5% Sensorcaine with epinephrine. A posterior portal was created and the glenohumeral joint thoroughly inspected with the findings as described above. An anterior portal was created using an outside-in technique. The labrum and rotator cuff were further probed, again confirming the above-noted findings. The full radius resector was introduced and used to remove numerous small cartilaginous loose bodies within the joint. The full radius resector was then used to debride the frayed margins of the labrum, the extensive synovitis anteriorly and superiorly, and of the frayed margins of the partial-thickness rotator cuff tear involving the anterior insertional fibers of the supraspinatus. It also was used to perform an abrasion chondroplasty of grade 3-4 chondromalacial changes of the glenoid. The areas of full-thickness articular cartilage loss underwent microfracturing with arthroscopic awls. The inflow was temporarily halted to ensure adequate blood flow from the bone into these areas. The ArthroCare wand was inserted and used to obtain hemostasis as well as to "anneal" the labrum superiorly and  anteriorly. The instruments were removed from the joint after suctioning the excess fluid.  The camera was repositioned through the posterior portal into the subacromial space. A separate lateral portal was created using an outside-in technique. The 3.5 mm full-radius resector was  introduced and used to perform a subtotal bursectomy. The ArthroCare wand was then inserted and used to remove the periosteal tissue off the undersurface of the anterior third of the acromion as well as to recess the coracoacromial ligament from its attachment along the anterior and lateral margins of the acromion. The 4.0 mm acromionizing bur was introduced and used to complete the decompression by removing the undersurface of the anterior third of the acromion. The ArthroCare wand was reinserted and used to denude the undersurface of the distal clavicle, removing the inferior capsule of the before meals joint. The 4 mm acromionizing bur was reintroduced and used to remove the inferior portion of the distal clavicle area. The camera was repositioned through the lateral portal and the 4 mm acromionizing bur introduced through the anterior portal. The distal clavicle was removed using both the 4 mm and 5.5 mm acromionizer bur. The adequacy of distal clavicle excision was verified by repositioning the camera through the anterior portal. The full radius resector was reintroduced to remove any residual bony debris before the ArthroCare wand was reintroduced to obtain hemostasis. The instruments were then removed from the subacromial space after suctioning the excess fluid.  An approximately 4-5 cm incision was made over the anterolateral aspect of the shoulder beginning at the anterolateral corner of the acromion and extending distally in line with the bicipital groove. This incision was carried down through the subcutaneous tissues to expose the deltoid fascia. The raphae between the anterior and middle thirds was identified and this  plane developed to provide access into the subacromial space. Additional bursal tissues were debrided sharply using Metzenbaum scissors. The rotator cuff was carefully inspected. There was a focal area of degenerative change involving the anterior insertional fibers of the supraspinatus which corresponded to the findings intra-articularly. The margins of this area of degenerative tendinopathy were debrided sharply with a #15 blade before this defect was reapproximated using a single #0 Ethibond suture. The defect measured approximately 0.4 x 0.8 cm. An apparent watertight closure was obtained.  The bicipital groove was identified by palpation and opened for 1-1.5 cm. The biceps tendon stump was retrieved through this defect. The tendon was tenodesed to the surrounding soft tissues using two #0 Ethibond interrupted sutures. The bicipital sheath was reapproximated using two #0 Ethibond interrupted sutures, incorporating the biceps tendon to further reinforce the tenodesis.  The wound was copiously irrigated with sterile saline solution before the deltoid raphae was reapproximated using 2-0 Vicryl interrupted sutures. The subcutaneous tissues were closed in two layers using 2-0 Vicryl interrupted sutures before the skin was closed using staples. The portal sites also were closed using staples. A sterile bulky dressing was applied to the shoulder before the arm was placed into a shoulder immobilizer. The patient was then awakened, extubated, and returned to the recovery room in satisfactory condition after tolerating the procedure well.

## 2015-01-16 NOTE — Transfer of Care (Signed)
Immediate Anesthesia Transfer of Care Note  Patient: Jessica Norman  Procedure(s) Performed: Procedure(s): SHOULDER ARTHROSCOPY WITH mini OPEN ROTATOR CUFF REPAIR,subacromial decompression, abrasion chondrop;asty with microfracture of glenoid, excision distal clavicle, biceps tenodesis, extensive debridement (Left)  Patient Location: PACU  Anesthesia Type:General  Level of Consciousness: awake, alert , oriented and patient cooperative  Airway & Oxygen Therapy: Patient Spontanous Breathing and Patient connected to nasal cannula oxygen  Post-op Assessment: Report given to RN and Post -op Vital signs reviewed and stable  Post vital signs: Reviewed and stable  Last Vitals:  Filed Vitals:   01/16/15 0615 01/16/15 1009  BP: 139/65 134/70  Pulse: 62 78  Temp: 36.8 C 37.1 C  Resp: 20 13    Complications: No apparent anesthesia complications

## 2015-01-16 NOTE — Anesthesia Procedure Notes (Signed)
Procedure Name: Intubation Date/Time: 01/16/2015 7:56 AM Performed by: Shirlee LimerickMARION, Edwin Baines Pre-anesthesia Checklist: Patient identified, Emergency Drugs available, Suction available and Patient being monitored Patient Re-evaluated:Patient Re-evaluated prior to inductionOxygen Delivery Method: Circle system utilized Preoxygenation: Pre-oxygenation with 100% oxygen Intubation Type: IV induction Laryngoscope Size: Mac and 3 Grade View: Grade I Tube size: 7.0 mm Number of attempts: 1 Placement Confirmation: ETT inserted through vocal cords under direct vision,  positive ETCO2 and breath sounds checked- equal and bilateral Secured at: 21 cm Tube secured with: Tape Dental Injury: Teeth and Oropharynx as per pre-operative assessment

## 2015-01-16 NOTE — Anesthesia Postprocedure Evaluation (Signed)
Anesthesia Post Note  Patient: Jessica Norman  Procedure(s) Performed: Procedure(s) (LRB): SHOULDER ARTHROSCOPY WITH mini OPEN ROTATOR CUFF REPAIR,subacromial decompression, abrasion chondrop;asty with microfracture of glenoid, excision distal clavicle, biceps tenodesis, extensive debridement (Left)  Patient location during evaluation: PACU Anesthesia Type: General Level of consciousness: awake and alert Pain management: pain level controlled Vital Signs Assessment: post-procedure vital signs reviewed and stable Respiratory status: spontaneous breathing, nonlabored ventilation, respiratory function stable and patient connected to nasal cannula oxygen Cardiovascular status: blood pressure returned to baseline and stable Postop Assessment: no signs of nausea or vomiting Anesthetic complications: no    Last Vitals:  Filed Vitals:   01/16/15 1107 01/16/15 1135  BP: 136/70 125/85  Pulse: 69 64  Temp: 35.9 C   Resp: 16     Last Pain:  Filed Vitals:   01/16/15 1141  PainSc: 0-No pain                 Lenard SimmerAndrew Quayshaun Hubbert

## 2015-01-16 NOTE — Anesthesia Preprocedure Evaluation (Signed)
Anesthesia Evaluation  Patient identified by MRN, date of birth, ID band Patient awake    Reviewed: Allergy & Precautions, H&P , NPO status , Patient's Chart, lab work & pertinent test results, reviewed documented beta blocker date and time   History of Anesthesia Complications Negative for: history of anesthetic complications  Airway Mallampati: III  TM Distance: >3 FB Neck ROM: full    Dental no notable dental hx. (+) Partial Lower, Missing, Poor Dentition   Pulmonary neg shortness of breath, asthma , neg sleep apnea, neg COPD, neg recent URI, former smoker,    Pulmonary exam normal breath sounds clear to auscultation       Cardiovascular Exercise Tolerance: Good hypertension, On Medications (-) angina(-) CAD, (-) Past MI, (-) Cardiac Stents and (-) CABG negative cardio ROS Normal cardiovascular exam(-) dysrhythmias (-) Valvular Problems/Murmurs Rhythm:regular Rate:Normal     Neuro/Psych neg Seizures PSYCHIATRIC DISORDERS (bipolar)  Neuromuscular disease (carpal tunnel)    GI/Hepatic Neg liver ROS, GERD  Medicated and Controlled,  Endo/Other  neg diabetesHypothyroidism   Renal/GU negative Renal ROS  negative genitourinary   Musculoskeletal   Abdominal   Peds  Hematology negative hematology ROS (+)   Anesthesia Other Findings Past Medical History:   Hearing loss                                                 Hypertension                                                 Memory loss                                                  Vision abnormalities                                         Asthma                                                       GERD (gastroesophageal reflux disease)                       Arthritis                                                    Bipolar disorder (HCC)                                       Hypothyroidism  History of Sjogren's  disease                                 Reproductive/Obstetrics negative OB ROS                             Anesthesia Physical Anesthesia Plan  ASA: III  Anesthesia Plan: General and Regional   Post-op Pain Management: MAC Combined w/ Regional for Post-op pain   Induction:   Airway Management Planned:   Additional Equipment:   Intra-op Plan:   Post-operative Plan:   Informed Consent: I have reviewed the patients History and Physical, chart, labs and discussed the procedure including the risks, benefits and alternatives for the proposed anesthesia with the patient or authorized representative who has indicated his/her understanding and acceptance.   Dental Advisory Given  Plan Discussed with: Anesthesiologist, CRNA and Surgeon  Anesthesia Plan Comments:         Anesthesia Quick Evaluation

## 2015-01-16 NOTE — H&P (Signed)
Paper H&P to be scanned into permanent record. H&P reviewed. No changes. 

## 2015-02-03 ENCOUNTER — Encounter: Payer: Self-pay | Admitting: Emergency Medicine

## 2015-02-03 ENCOUNTER — Emergency Department
Admission: EM | Admit: 2015-02-03 | Discharge: 2015-02-03 | Disposition: A | Discharge status: Home / Self Care | Attending: Emergency Medicine | Admitting: Emergency Medicine

## 2015-02-03 ENCOUNTER — Emergency Department

## 2015-02-03 DIAGNOSIS — Z87891 Personal history of nicotine dependence: Secondary | ICD-10-CM | POA: Diagnosis not present

## 2015-02-03 DIAGNOSIS — I10 Essential (primary) hypertension: Secondary | ICD-10-CM | POA: Diagnosis not present

## 2015-02-03 DIAGNOSIS — Z7982 Long term (current) use of aspirin: Secondary | ICD-10-CM | POA: Insufficient documentation

## 2015-02-03 DIAGNOSIS — R2243 Localized swelling, mass and lump, lower limb, bilateral: Secondary | ICD-10-CM | POA: Diagnosis present

## 2015-02-03 DIAGNOSIS — Z79899 Other long term (current) drug therapy: Secondary | ICD-10-CM | POA: Insufficient documentation

## 2015-02-03 DIAGNOSIS — R079 Chest pain, unspecified: Secondary | ICD-10-CM | POA: Insufficient documentation

## 2015-02-03 DIAGNOSIS — Z7951 Long term (current) use of inhaled steroids: Secondary | ICD-10-CM | POA: Diagnosis not present

## 2015-02-03 DIAGNOSIS — Z880 Allergy status to penicillin: Secondary | ICD-10-CM | POA: Diagnosis not present

## 2015-02-03 DIAGNOSIS — R600 Localized edema: Secondary | ICD-10-CM | POA: Diagnosis not present

## 2015-02-03 DIAGNOSIS — M79605 Pain in left leg: Secondary | ICD-10-CM | POA: Diagnosis not present

## 2015-02-03 DIAGNOSIS — R609 Edema, unspecified: Secondary | ICD-10-CM

## 2015-02-03 LAB — TROPONIN I

## 2015-02-03 LAB — BASIC METABOLIC PANEL
ANION GAP: 6 (ref 5–15)
BUN: 15 mg/dL (ref 6–20)
CO2: 32 mmol/L (ref 22–32)
Calcium: 9 mg/dL (ref 8.9–10.3)
Chloride: 101 mmol/L (ref 101–111)
Creatinine, Ser: 0.88 mg/dL (ref 0.44–1.00)
GFR calc Af Amer: >60 mL/min (ref >60)
GLUCOSE: 88 mg/dL (ref 65–99)
POTASSIUM: 3.5 mmol/L (ref 3.5–5.1)
SODIUM: 139 mmol/L (ref 135–145)

## 2015-02-03 LAB — CBC
HEMATOCRIT: 38.2 % (ref 35.0–47.0)
HEMOGLOBIN: 12.3 g/dL (ref 12.0–16.0)
MCH: 28.7 pg (ref 26.0–34.0)
MCHC: 32.3 g/dL (ref 32.0–36.0)
MCV: 88.9 fL (ref 80.0–100.0)
Platelets: 159 10*3/uL (ref 150–440)
RBC: 4.3 MIL/uL (ref 3.80–5.20)
RDW: 13.1 % (ref 11.5–14.5)
WBC: 5.1 10*3/uL (ref 3.6–11.0)

## 2015-02-03 MED ORDER — FENTANYL CITRATE (PF) 100 MCG/2ML IJ SOLN
50.0000 ug | Freq: Once | INTRAMUSCULAR | Status: AC
  Administered 2015-02-03: 50 ug via INTRAVENOUS
  Filled 2015-02-03: qty 2

## 2015-02-03 MED ORDER — ONDANSETRON HCL 4 MG/2ML IJ SOLN
4.0000 mg | Freq: Once | INTRAMUSCULAR | Status: AC
  Administered 2015-02-03: 4 mg via INTRAVENOUS
  Filled 2015-02-03: qty 2

## 2015-02-03 NOTE — ED Notes (Signed)
Pt presents with left lower leg pain and swelling along with chest pain started two days ago. Pt with recent surgery to left shoulder back on December 29th 2016.

## 2015-02-03 NOTE — ED Provider Notes (Signed)
[facility] Emergency Department Provider Note  ____________________________________________  Time seen: Approximately 120 PM  I have reviewed the triage vital signs and the nursing notes.   HISTORY  Chief Complaint Leg Swelling and Chest Pain    HPI Jessica Norman is a 65 y.o. female with a recent history of left rotator cuff surgery on December 29 was presenting today with 5 days of bilateral lower extremity swelling with the left being greater than the right as well as intermittent chest pain. She said that the leg swelling started last week and has progressed to the point where she cannot sleep because of the pain in her left leg. She denies having swelling in her legs previously. She denies any history of blood clots. She said the surgery was a same-day surgery and she did not have any prolonged hospital stay. The chest pain is to the right side of her chest and is a squeezing pain that radiates around under her right armpit and to her right thoracic back. It is worse when she moves her right arm. She says she has had increased movement in her right arm since the surgery. She is using this arm almost exclusively because the left arm is still in a sling. At rest she denies any shortness of breath. She denies any increased pain with deep breathing. She says that when she walks and moves her right arm that the pain does get worse and she does have short of breath at this time.   Past Medical History  Diagnosis Date  . Hearing loss   . Hypertension   . Memory loss   . Vision abnormalities   . Asthma   . GERD (gastroesophageal reflux disease)   . Arthritis   . Bipolar disorder (HCC)   . Hypothyroidism   . History of Sjogren's disease     Patient Active Problem List   Diagnosis Date Noted  . Numbness 11/25/2014  . Insomnia 08/01/2014  . Abdominal pain, left upper quadrant 07/16/2014  . Abnormal finding on ultrasound 07/16/2014  . Fatty liver disease,  nonalcoholic 07/16/2014  . Small intestinal bacterial overgrowth 07/16/2014  . Asthma without status asthmaticus 06/20/2014  . HLD (hyperlipidemia) 06/20/2014  . Dermatitis, eczematoid 06/20/2014  . Acid reflux 06/20/2014  . Cephalalgia 06/20/2014  . BP (high blood pressure) 06/20/2014  . Adiposity 06/20/2014  . Arthritis, degenerative 06/20/2014  . Allergic rhinitis, seasonal 06/20/2014  . Proximal limb weakness 06/20/2014  . Proximal leg weakness 06/20/2014  . Disturbed cognition 06/20/2014  . Carpal tunnel syndrome 06/20/2014  . Tinnitus 06/20/2014    Past Surgical History  Procedure Laterality Date  . Vesicovaginal fistula closure w/ tah    . Back surgery    . Neck surgery    . Carpal tunnel release Right   . Abdominal hysterectomy    . Foot surgery    . Excision of tongue lesion N/A 12/19/2014    Procedure: EXCISION OF TONGUE LESION;  Surgeon: Bud Face, MD;  Location: [facility] ORS;  Service: ENT;  Laterality: N/A;  . Eye surgery      cataracts  . Shoulder arthroscopy with open rotator cuff repair Left 01/16/2015    Procedure: SHOULDER ARTHROSCOPY WITH mini OPEN ROTATOR CUFF REPAIR,subacromial decompression, abrasion chondrop;asty with microfracture of glenoid, excision distal clavicle, biceps tenodesis, extensive debridement;  Surgeon: Christena Flake, MD;  Location: [facility] ORS;  Service: Orthopedics;  Laterality: Left;    Current Outpatient Rx  Name  Route  Sig  Dispense  Refill  . amLODipine (NORVASC) 10 MG tablet   Oral   Take 10 mg by mouth daily.          Marland Kitchen. aspirin EC 81 MG tablet   Oral   Take 81 mg by mouth daily.          Marland Kitchen. dexlansoprazole (DEXILANT) 60 MG capsule   Oral   Take 60 mg by mouth daily.         . divalproex (DEPAKOTE ER) 500 MG 24 hr tablet   Oral   Take 500 mg by mouth daily.          Marland Kitchen. esomeprazole (NEXIUM) 40 MG capsule   Oral   Take 40 mg by mouth.          . Fluticasone-Salmeterol (ADVAIR) 250-50 MCG/DOSE AEPB    Inhalation   Inhale 1 puff into the lungs 2 (two) times daily.          Marland Kitchen. gabapentin (NEURONTIN) 100 MG capsule   Oral   Take 100 mg by mouth 2 (two) times daily.          . hydroxychloroquine (PLAQUENIL) 200 MG tablet   Oral   Take 200 mg by mouth daily.          Marland Kitchen. levothyroxine (SYNTHROID, LEVOTHROID) 50 MCG tablet      TK 1 T PO D FOR HYPOTHYROIDISM         . lovastatin (MEVACOR) 10 MG tablet   Oral   Take 10 mg by mouth at bedtime.          . nabumetone (RELAFEN) 500 MG tablet   Oral   Take 500 mg by mouth 2 (two) times daily.          Marland Kitchen. oxyCODONE (ROXICODONE) 5 MG immediate release tablet   Oral   Take 1-2 tablets (5-10 mg total) by mouth every 4 (four) hours as needed for severe pain.   60 tablet   0   . sertraline (ZOLOFT) 100 MG tablet   Oral   Take 100 mg by mouth daily.            Allergies Codeine; Penicillin g; and Sulfa antibiotics  Family History  Problem Relation Age of Onset  . Diabetes Mother   . Stroke Mother   . COPD Father     Social History Social History  Substance Use Topics  . Smoking status: Former Smoker    Quit date: 01/13/1965  . Smokeless tobacco: Never Used  . Alcohol Use: No    Review of Systems Constitutional: No fever/chills Eyes: No visual changes. ENT: No sore throat. Cardiovascular: As above Respiratory: As above  Gastrointestinal: No abdominal pain.  No nausea, no vomiting.  No diarrhea.  No constipation. Genitourinary: Negative for dysuria. Musculoskeletal: Negative for back pain. Skin: Negative for rash. Neurological: Negative for headaches, focal weakness or numbness.  10-point ROS otherwise negative.  ____________________________________________   PHYSICAL EXAM:  VITAL SIGNS: ED Triage Vitals  Enc Vitals Group     BP --      Pulse --      Resp --      Temp --      Temp src --      SpO2 --      Weight 02/03/15 1051 230 lb (104.327 kg)     Height 02/03/15 1051 5\' 9"  (1.753 m)      Head Cir --      Peak Flow --  Pain Score 02/03/15 1052 7     Pain Loc --      Pain Edu? --      Excl. in GC? --     Constitutional: Alert and oriented. Well appearing and in no acute distress. Eyes: Conjunctivae are normal. PERRL. EOMI. Head: Atraumatic. Nose: No congestion/rhinnorhea. Mouth/Throat: Mucous membranes are moist.  Oropharynx non-erythematous. Neck: No stridor.   Cardiovascular: Normal rate, regular rhythm. Grossly normal heart sounds.  Good peripheral circulation. Chest pain as well as right axillary as well as right thoracic back pain is all reproducible to palpation. Respiratory: Normal respiratory effort.  No retractions. Lungs CTAB. Gastrointestinal: Soft and nontender. No distention. No abdominal bruits. No CVA tenderness. Musculoskeletal: Bilateral lower extremity edema from the feet to the calves which is worse on the left. There are bilateral dorsalis pedis pulses which are equal.  Left upper extremity in sling.   No swelling/edema.   Neurologic:  Normal speech and language. No gross focal neurologic deficits are appreciated. No gait instability. Skin:  Skin is warm, dry and intact. No rash noted. Psychiatric: Mood and affect are normal. Speech and behavior are normal.  ____________________________________________   LABS (all labs ordered are listed, but only abnormal results are displayed)  Labs Reviewed  BASIC METABOLIC PANEL  TROPONIN I  CBC   ____________________________________________  EKG  ED ECG REPORT I, Schaevitz,  Teena Irani, the attending physician, personally viewed and interpreted this ECG.   Date: 02/03/2015  EKG Time: 1054  Rate: 75  Rhythm: normal sinus rhythm  Axis: Normal axis  Intervals:none  ST&T Change: No ST segment elevation or depression. No abnormal T-wave inversion. No S1 q3 T3 pattern.  ____________________________________________  RADIOLOGY  No acute disease on the chest  x-ray. ____________________________________________   PROCEDURES   ____________________________________________   INITIAL IMPRESSION / ASSESSMENT AND PLAN / ED COURSE  Pertinent labs & imaging results that were available during my care of the patient were reviewed by me and considered in my medical decision making (see chart for details).  ----------------------------------------- 3:32 PM on 02/03/2015 -----------------------------------------  Chest pain consistent with musculoskeletal chest pain. Says that she is using the right upper extremity extensively because the left upper extremity is in the sling. The pain is exquisitely reproducible. No signs of right heart strain on EKG. Not hypoxic, negative troponin. Patient is saying that she is not mobilizing very much since his surgery. I feel this is likely the cause of her peripheral edema as long as the ultrasound is negative. Signed out to Dr. Carollee Massed. ____________________________________________   FINAL CLINICAL IMPRESSION(S) / ED DIAGNOSES  Bilateral lower extremity edema.    Myrna Blazer, MD 02/03/15 1535

## 2015-02-03 NOTE — ED Provider Notes (Signed)
-----------------------------------------   4:23 PM on 02/03/2015 -----------------------------------------  I accepted signout from Dr. Langston MaskerShaevitz. Ultrasound of lower extremities pending.   ----------------------------------------- 4:44 PM on 02/03/2015 -----------------------------------------  FINDINGS: RIGHT LOWER EXTREMITY  Common Femoral Vein: No evidence of thrombus. Normal compressibility, respiratory phasicity and response to augmentation.  Saphenofemoral Junction: No evidence of thrombus.  Profunda Femoral Vein: No evidence of thrombus.  Femoral Vein: No evidence of thrombus.  Popliteal Vein: No evidence of thrombus.  Calf Veins: No evidence of thrombus.  LEFT LOWER EXTREMITY  Common Femoral Vein: No evidence of thrombus. Normal compressibility, respiratory phasicity and response to augmentation.  Saphenofemoral Junction: No evidence of thrombus.  Profunda Femoral Vein: No evidence of thrombus.  Femoral Vein: No evidence of thrombus.  Popliteal Vein: No evidence of thrombus.  Calf Veins: No evidence of thrombus.  IMPRESSION: No evidence of deep venous thrombosis in either lower extremity. ~~~~~~~~~~~~~~~~~~~~~~  At this time, the patient has asked nursing staff if she may go home. The ultrasound report above is now available and shows no presence of DVT in the lower extremities.  I've discussed the report with the patient and reexamined her. I have counseled her on precautions for return to emergency department and for outpatient follow-up.   Darien Ramusavid W Ladarrion Telfair, MD 02/03/15 [phone]

## 2015-02-03 NOTE — Discharge Instructions (Signed)
Chest Wall Pain °Chest wall pain is pain in or around the bones and muscles of your chest. Sometimes, an injury causes this pain. Sometimes, the cause may not be known. This pain may take several weeks or longer to get better. °HOME CARE °Pay attention to any changes in your symptoms. Take these actions to help with your pain: °· Rest as told by your doctor. °· Avoid activities that cause pain. Try not to use your chest, belly (abdominal), or side muscles to lift heavy things. °· If directed, apply ice to the painful area: °¨ Put ice in a plastic bag. °¨ Place a towel between your skin and the bag. °¨ Leave the ice on for 20 minutes, 2-3 times per day. °· Take over-the-counter and prescription medicines only as told by your doctor. °· Do not use tobacco products, including cigarettes, chewing tobacco, and e-cigarettes. If you need help quitting, ask your doctor. °· Keep all follow-up visits as told by your doctor. This is important. °GET HELP IF: °· You have a fever. °· Your chest pain gets worse. °· You have new symptoms. °GET HELP RIGHT AWAY IF: °· You feel sick to your stomach (nauseous) or you throw up (vomit). °· You feel sweaty or light-headed. °· You have a cough with phlegm (sputum) or you cough up blood. °· You are short of breath. °  °This information is not intended to replace advice given to you by your health care provider. Make sure you discuss any questions you have with your health care provider. °  °Document Released: 06/23/2007 Document Revised: 09/25/2014 Document Reviewed: 04/01/2014 °Elsevier Interactive Patient Education ©2016 [employer] ° °

## 2015-02-12 ENCOUNTER — Other Ambulatory Visit
Admission: RE | Admit: 2015-02-12 | Discharge: 2015-02-12 | Disposition: A | Discharge status: Home / Self Care | Source: Ambulatory Visit | Attending: Rheumatology | Admitting: Rheumatology

## 2015-02-12 DIAGNOSIS — G8929 Other chronic pain: Secondary | ICD-10-CM | POA: Insufficient documentation

## 2015-02-12 DIAGNOSIS — M25562 Pain in left knee: Secondary | ICD-10-CM | POA: Insufficient documentation

## 2015-02-12 LAB — SYNOVIAL CELL COUNT + DIFF, W/ CRYSTALS
Crystals, Fluid: NONE SEEN
Eosinophils-Synovial: 1 %
LYMPHOCYTES-SYNOVIAL FLD: 21 %
MONOCYTE-MACROPHAGE-SYNOVIAL FLUID: 69 %
Neutrophil, Synovial: 9 %
OTHER CELLS-SYN: 0
WBC, Synovial: 59 /mm3 (ref 0–200)

## 2015-05-05 ENCOUNTER — Encounter: Payer: Self-pay | Admitting: *Deleted

## 2015-05-06 ENCOUNTER — Encounter: Payer: Self-pay | Admitting: Urology

## 2015-05-06 ENCOUNTER — Telehealth: Payer: Self-pay | Admitting: Urology

## 2015-05-06 ENCOUNTER — Ambulatory Visit (INDEPENDENT_AMBULATORY_CARE_PROVIDER_SITE_OTHER): Admitting: Urology

## 2015-05-06 VITALS — BP 130/73 | HR 80 | Ht 69.0 in | Wt 224.8 lb

## 2015-05-06 DIAGNOSIS — R32 Unspecified urinary incontinence: Secondary | ICD-10-CM | POA: Diagnosis not present

## 2015-05-06 DIAGNOSIS — R339 Retention of urine, unspecified: Secondary | ICD-10-CM

## 2015-05-06 DIAGNOSIS — R609 Edema, unspecified: Secondary | ICD-10-CM

## 2015-05-06 DIAGNOSIS — R600 Localized edema: Secondary | ICD-10-CM

## 2015-05-06 LAB — BLADDER SCAN AMB NON-IMAGING: Scan Result: 137

## 2015-05-06 NOTE — Telephone Encounter (Signed)
Would you send a copy of my note to Dr. Marvis MoellerMiles?

## 2015-05-06 NOTE — Telephone Encounter (Signed)
Done ° ° °Jessica Norman °

## 2015-05-06 NOTE — Progress Notes (Signed)
05/06/2015 1:59 PM   Jessica Norman 1950/05/08 [id number]  Referring provider: Leanna Sato, MD [address] [place], [place] [address]  Chief Complaint  Patient presents with  . Incomplete bladder emptying    patient states trouble emptying her bladder    HPI: Patient is a 65 year old [place] American female who presents today at the request of her PCP, Dr. Marvis Moeller, for trouble emptying her bladder.    Patient states that she has the sudden urge to void, but when she sits to urinate she cannot void.  Then when she goes to stand up, the urine pours out.  She does not lose urine when she laughs, coughs or sneezes.  This has been occuring since her shoulder surgery in December 2016.  She is wearing depends daily.  She has nocturia x 3, but sometimes as much as 4 times a night.     She is not having any hematuria, dysuria or suprapubic pain.  She does not have a history of recurrent UTI's.  She denies constipation and diarrhea.    She is also concerned about her peripheral edema because she is on two diuretics and it has not improved.    She did not provide a specimen today and her PVR was 137 mL.     PMH: Past Medical History  Diagnosis Date  . Hearing loss   . Hypertension   . Memory loss   . Vision abnormalities   . Asthma   . GERD (gastroesophageal reflux disease)   . Arthritis   . Bipolar disorder (HCC)   . Hypothyroidism   . History of Sjogren's disease   . Recurrent UTI   . HLD (hyperlipidemia)   . Obesity   . Incomplete bladder emptying     Surgical History: Past Surgical History  Procedure Laterality Date  . Vesicovaginal fistula closure w/ tah    . Back surgery    . Neck surgery    . Carpal tunnel release Right   . Abdominal hysterectomy    . Foot surgery    . Excision of tongue lesion N/A 12/19/2014    Procedure: EXCISION OF TONGUE LESION;  Surgeon: Bud Face, MD;  Location: [facility];  Service: ENT;  Laterality: N/A;  . Eye surgery     cataracts  . Shoulder arthroscopy with open rotator cuff repair Left 01/16/2015    Procedure: SHOULDER ARTHROSCOPY WITH mini OPEN ROTATOR CUFF REPAIR,subacromial decompression, abrasion chondrop;asty with microfracture of glenoid, excision distal clavicle, biceps tenodesis, extensive debridement;  Surgeon: Christena Flake, MD;  Location: [facility];  Service: Orthopedics;  Laterality: Left;    Home Medications:    Medication List       This list is accurate as of: 05/06/15  1:59 PM.  Always use your most recent med list.               aluminum hydroxide-magnesium carbonate 95-358 MG/15ML Susp  Commonly known as:  GAVISCON  Take by mouth.     amLODipine 10 MG tablet  Commonly known as:  NORVASC  Take 10 mg by mouth daily.     aspirin EC 81 MG tablet  Take 81 mg by mouth daily.     clindamycin 300 MG capsule  Commonly known as:  CLEOCIN  Reported on 05/06/2015     DEXILANT 60 MG capsule  Generic drug:  dexlansoprazole  Take 60 mg by mouth daily. Reported on 05/06/2015     dicyclomine 10 MG capsule  Commonly known as:  BENTYL  Take by mouth. Reported on 05/06/2015     divalproex 500 MG 24 hr tablet  Commonly known as:  DEPAKOTE ER  Take 500 mg by mouth daily.     esomeprazole 40 MG capsule  Commonly known as:  NEXIUM  Take 40 mg by mouth.     fexofenadine 180 MG tablet  Commonly known as:  ALLEGRA  Take by mouth.     Fluticasone-Salmeterol 250-50 MCG/DOSE Aepb  Commonly known as:  ADVAIR  Inhale 1 puff into the lungs 2 (two) times daily.     furosemide 40 MG tablet  Commonly known as:  LASIX  Reported on 05/06/2015     gabapentin 100 MG capsule  Commonly known as:  NEURONTIN  Take 100 mg by mouth 2 (two) times daily.     hydrochlorothiazide 50 MG tablet  Commonly known as:  HYDRODIURIL  Take by mouth.     hydroxychloroquine 200 MG tablet  Commonly known as:  PLAQUENIL  Take 200 mg by mouth daily.     levothyroxine 50 MCG tablet  Commonly known as:   SYNTHROID, LEVOTHROID  TK 1 T PO D FOR HYPOTHYROIDISM     lovastatin 10 MG tablet  Commonly known as:  MEVACOR  Take 10 mg by mouth at bedtime.     metroNIDAZOLE 0.75 % vaginal gel  Commonly known as:  METROGEL  Reported on 05/06/2015     nabumetone 500 MG tablet  Commonly known as:  RELAFEN  Take 500 mg by mouth 2 (two) times daily. Reported on 05/06/2015     OSPHENA 60 MG Tabs  Generic drug:  Ospemifene  TK 1 T PO  D WITH FOOD     oxyCODONE 5 MG immediate release tablet  Commonly known as:  ROXICODONE  Take 1-2 tablets (5-10 mg total) by mouth every 4 (four) hours as needed for severe pain.     potassium chloride 10 MEQ tablet  Commonly known as:  K-DUR  Reported on 05/06/2015     pyridostigmine 60 MG tablet  Commonly known as:  MESTINON  Reported on 05/06/2015     QUEtiapine 25 MG tablet  Commonly known as:  SEROQUEL  Reported on 05/06/2015     rifaximin 550 MG Tabs tablet  Commonly known as:  XIFAXAN  Take by mouth. Reported on 05/06/2015     sertraline 100 MG tablet  Commonly known as:  ZOLOFT  Take 100 mg by mouth daily.     sulindac 200 MG tablet  Commonly known as:  CLINORIL  Take by mouth.        Allergies:  Allergies  Allergen Reactions  . Codeine Itching  . Penicillin G     Other reaction(s): ITCHING  . Sulfa Antibiotics Itching    Family History: Family History  Problem Relation Age of Onset  . Diabetes Mother   . Stroke Mother   . COPD Father   . Tuberculosis Father   . Kidney disease Neg Hx   . Bladder Cancer Neg Hx     Social History:  reports that she quit smoking about 50 years ago. She has never used smokeless tobacco. She reports that she drinks alcohol. She reports that she does not use illicit drugs.  ROS: UROLOGY Frequent Urination?: No Hard to postpone urination?: Yes Burning/pain with urination?: No Get up at night to urinate?: Yes Leakage of urine?: No Urine stream starts and stops?: Yes Trouble starting stream?:  Yes Do you have  to strain to urinate?: No Blood in urine?: No Urinary tract infection?: No Sexually transmitted disease?: No Injury to kidneys or bladder?: No Painful intercourse?: No Weak stream?: Yes Currently pregnant?: No Vaginal bleeding?: No Last menstrual period?: n  Gastrointestinal Nausea?: No Vomiting?: No Indigestion/heartburn?: No Diarrhea?: No Constipation?: No  Constitutional Fever: No Night sweats?: No Weight loss?: No Fatigue?: Yes  Skin Skin rash/lesions?: No Itching?: No  Eyes Blurred vision?: Yes Double vision?: No  Ears/Nose/Throat Sore throat?: No Sinus problems?: No  Hematologic/Lymphatic Swollen glands?: No Easy bruising?: No  Cardiovascular Leg swelling?: Yes Chest pain?: No  Respiratory Cough?: No Shortness of breath?: No  Endocrine Excessive thirst?: Yes  Musculoskeletal Back pain?: No Joint pain?: No  Neurological Headaches?: No Dizziness?: No  Psychologic Depression?: No Anxiety?: No  Physical Exam: BP 130/73 mmHg  Pulse 80  Ht 5\' 9"  (1.753 m)  Wt 224 lb 12.8 oz (101.969 kg)  BMI 33.18 kg/m2  Constitutional: Well nourished. Alert and oriented, No acute distress. HEENT: [place] AT, moist mucus membranes. Trachea midline, no masses. Cardiovascular: No clubbing, cyanosis, or edema. Respiratory: Normal respiratory effort, no increased work of breathing. GI: Abdomen is soft, non tender, non distended, no abdominal masses. Liver and spleen not palpable.  No hernias appreciated.  Stool sample for occult testing is not indicated.   GU: No CVA tenderness.  No bladder fullness or masses.  Normal external genitalia, normal pubic hair distribution, no lesions.  Normal urethral meatus, no lesions, no prolapse, no discharge.   No urethral masses, tenderness and/or tenderness. No bladder fullness, tenderness or masses. Normal vagina mucosa, good estrogen effect, no discharge, no lesions, good pelvic support, Grade I cystocele is  noted.  No rectocele is noted.  Cervix and uterus are surgically absent.   No adnexal/parametria masses or tenderness noted.  Anus and perineum are without rashes or lesions.    Skin: No rashes, bruises or suspicious lesions. Lymph: No cervical or inguinal adenopathy. Neurologic: Grossly intact, no focal deficits, moving all 4 extremities. Psychiatric: Normal mood and affect.  Laboratory Data: Lab Results  Component Value Date   WBC 5.1 02/03/2015   HGB 12.3 02/03/2015   HCT 38.2 02/03/2015   MCV 88.9 02/03/2015   PLT 159 02/03/2015    Lab Results  Component Value Date   CREATININE 0.88 02/03/2015    Lab Results  Component Value Date   AST 18 11/25/2014   Lab Results  Component Value Date   ALT 11 11/25/2014     Pertinent Imaging: Results for Jessica GildingMILES, Jodeen G (MRN [id number]) as of 05/06/2015 11:55  Ref. Range 05/06/2015 11:40  Scan Result Unknown 137    Assessment & Plan:    1. Incomplete bladder emptying:  Patient could not provide a specimen for us, but she did use the restroom before she left the office.  She said she left a specimen, but we found her empty urine specimen cup in the trash.    - Urinalysis, Complete - BLADDER SCAN AMB NON-IMAGING  2. Incontinence:   Patient loses urine when she stands.  She has a mild cystocele.  We discussed a referral to PT and she is agreeable.    3. Peripheral edema:   Patient would like a referral to cardiology to rule out cardiac issues that may be causing her edema.    Return for follow up after PT.  These notes generated with voice recognition software. I apologize for typographical errors.  Michiel CowboySHANNON Tilley Faeth, PA-C  [facility] [phone]  [address], [place], [place] [address] [phone]

## 2015-05-20 ENCOUNTER — Ambulatory Visit: Admitting: Cardiology

## 2015-05-22 ENCOUNTER — Encounter: Payer: Self-pay | Admitting: Cardiology

## 2015-05-22 ENCOUNTER — Ambulatory Visit (INDEPENDENT_AMBULATORY_CARE_PROVIDER_SITE_OTHER): Admitting: Cardiology

## 2015-05-22 VITALS — BP 130/80 | HR 78 | Ht 69.0 in | Wt 216.0 lb

## 2015-05-22 DIAGNOSIS — R0602 Shortness of breath: Secondary | ICD-10-CM | POA: Diagnosis not present

## 2015-05-22 DIAGNOSIS — R079 Chest pain, unspecified: Secondary | ICD-10-CM

## 2015-05-22 DIAGNOSIS — I10 Essential (primary) hypertension: Secondary | ICD-10-CM

## 2015-05-22 DIAGNOSIS — M7989 Other specified soft tissue disorders: Secondary | ICD-10-CM

## 2015-05-22 DIAGNOSIS — E785 Hyperlipidemia, unspecified: Secondary | ICD-10-CM

## 2015-05-22 DIAGNOSIS — E669 Obesity, unspecified: Secondary | ICD-10-CM

## 2015-05-22 NOTE — Progress Notes (Signed)
Cardiology Office Note   Date:  05/22/2015   ID:  Jessica Norman, DOB 02-20-1950, MRN [id number]  Referring Doctor:  Leanna Sato, MD   Cardiologist:   Almond Lint, MD   Reason for consultation:  Chief Complaint  Patient presents with  . other    C/o chest pain, sob with exertion and edema. Meds reviewed verbally with pt.      History of Present Illness: Jessica Norman is a 65 y.o. female who presents for chest pain, shortness breath, edema.  Her issues with leg swelling started shortly after she had left shoulder surgery, 01/16/2015. The left leg was noted to be more swollen than the right leg, on and off, mild to moderate in severity, no associated pain. She has had an ultrasound of her legs that ruled out a clot.  Also, since then, she has noted chest pain. She describes this as a dull ache, radiating down to the arm, 5 out of 10 severity, randomly occurring, lasts seconds at a time. No known precipitating or palliating factors, resolves spontaneously. Since onset, frequency has noted to increase. Also has developed shortness of breath with exertion. More recently, chest pain has been noted with exertion.  She complains of orthopnea. Denies headache, fever, cough, colds, abdominal pain. No PND. No loss of consciousness. No palpitations.  ROS:  Please see the history of present illness. Aside from mentioned under HPI, all other systems are reviewed and negative.     Past Medical History  Diagnosis Date  . Hearing loss   . Hypertension   . Memory loss   . Vision abnormalities   . Asthma   . GERD (gastroesophageal reflux disease)   . Arthritis   . Bipolar disorder (HCC)   . Hypothyroidism   . History of Sjogren's disease   . Recurrent UTI   . HLD (hyperlipidemia)   . Obesity   . Incomplete bladder emptying     Past Surgical History  Procedure Laterality Date  . Vesicovaginal fistula closure w/ tah    . Back surgery    . Neck surgery    . Carpal tunnel  release Right   . Abdominal hysterectomy    . Foot surgery    . Excision of tongue lesion N/A 12/19/2014    Procedure: EXCISION OF TONGUE LESION;  Surgeon: Bud Face, MD;  Location: [facility];  Service: ENT;  Laterality: N/A;  . Eye surgery      cataracts  . Shoulder arthroscopy with open rotator cuff repair Left 01/16/2015    Procedure: SHOULDER ARTHROSCOPY WITH mini OPEN ROTATOR CUFF REPAIR,subacromial decompression, abrasion chondrop;asty with microfracture of glenoid, excision distal clavicle, biceps tenodesis, extensive debridement;  Surgeon: Christena Flake, MD;  Location: [facility];  Service: Orthopedics;  Laterality: Left;     reports that she quit smoking about 50 years ago. She has never used smokeless tobacco. She reports that she does not drink alcohol or use illicit drugs.   family history includes COPD in her father; Diabetes in her mother; Stroke in her mother; Tuberculosis in her father. There is no history of Kidney disease or Bladder Cancer. a brother had stents in his 33s.  Current Outpatient Prescriptions  Medication Sig Dispense Refill  . aluminum hydroxide-magnesium carbonate (GAVISCON) 95-358 MG/15ML SUSP Take by mouth.    Marland Kitchen amLODipine (NORVASC) 10 MG tablet Take 10 mg by mouth daily.     Marland Kitchen aspirin EC 81 MG tablet Take 81 mg by mouth daily.     Marland Kitchen  dexlansoprazole (DEXILANT) 60 MG capsule Take 60 mg by mouth daily. Reported on 05/06/2015    . dicyclomine (BENTYL) 10 MG capsule Take by mouth. Reported on 05/06/2015    . divalproex (DEPAKOTE ER) 500 MG 24 hr tablet Take 500 mg by mouth daily.     Marland Kitchen. esomeprazole (NEXIUM) 40 MG capsule Take 40 mg by mouth.     . fexofenadine (ALLEGRA) 180 MG tablet Take by mouth.    . Fluticasone-Salmeterol (ADVAIR) 250-50 MCG/DOSE AEPB Inhale 1 puff into the lungs 2 (two) times daily.     . furosemide (LASIX) 40 MG tablet Reported on 05/06/2015  0  . gabapentin (NEURONTIN) 100 MG capsule Take 100 mg by mouth 2 (two) times daily.     .  hydrochlorothiazide (HYDRODIURIL) 50 MG tablet Take by mouth.    . hydroxychloroquine (PLAQUENIL) 200 MG tablet Take 200 mg by mouth daily.     Marland Kitchen. levothyroxine (SYNTHROID, LEVOTHROID) 50 MCG tablet TK 1 T PO D FOR HYPOTHYROIDISM    . lovastatin (MEVACOR) 10 MG tablet Take 10 mg by mouth at bedtime.     . nabumetone (RELAFEN) 500 MG tablet Take 500 mg by mouth 2 (two) times daily. Reported on 05/06/2015    . OSPHENA 60 MG TABS TK 1 T PO  D WITH FOOD  11  . QUEtiapine (SEROQUEL) 25 MG tablet Reported on 05/06/2015  1  . rifaximin (XIFAXAN) 550 MG TABS tablet Take by mouth. Reported on 05/06/2015    . sertraline (ZOLOFT) 100 MG tablet Take 100 mg by mouth daily.     . sulindac (CLINORIL) 200 MG tablet Take by mouth.     No current facility-administered medications for this visit.    Allergies: Codeine; Penicillin g; and Sulfa antibiotics    PHYSICAL EXAM: VS:  BP 130/80 mmHg  Pulse 78  Ht 5\' 9"  (1.753 m)  Wt 216 lb (97.977 kg)  BMI 31.88 kg/m2 , Body mass index is 31.88 kg/(m^2). Wt Readings from Last 3 Encounters:  05/22/15 216 lb (97.977 kg)  05/06/15 224 lb 12.8 oz (101.969 kg)  02/03/15 230 lb (104.327 kg)    GENERAL:  well developed, well nourished, obese, not in acute distress HEENT: normocephalic, pink conjunctivae, anicteric sclerae, no xanthelasma, normal dentition, oropharynx clear NECK:  no neck vein engorgement, JVP normal, no hepatojugular reflux, carotid upstroke brisk and symmetric, no bruit, no thyromegaly, no lymphadenopathy LUNGS:  good respiratory effort, clear to auscultation bilaterally CV:  PMI not displaced, no thrills, no lifts, S1 and S2 within normal limits, no palpable S3 or S4, no murmurs, no rubs, no gallops ABD:  Soft, nontender, nondistended, normoactive bowel sounds, no abdominal aortic bruit, no hepatomegaly, no splenomegaly MS: nontender back, no kyphosis, no scoliosis, no joint deformities EXT:  2+ DP/PT pulses, no edema, no varicosities, no cyanosis,  no clubbing SKIN: warm, nondiaphoretic, normal turgor, no ulcers NEUROPSYCH: alert, oriented to person, place, and time, sensory/motor grossly intact, normal mood, appropriate affect  Recent Labs: 11/25/2014: ALT 11 02/03/2015: BUN 15; Creatinine, Ser 0.88; Hemoglobin 12.3; Platelets 159; Potassium 3.5; Sodium 139   Lipid Panel No results found for: CHOL, TRIG, HDL, CHOLHDL, VLDL, LDLCALC, LDLDIRECT   Other studies Reviewed:  EKG:  The ekg from 05/22/2015 was personally reviewed by me and it revealed  Sinus rhythm, 78 BPM. Q waves in V1 and V2, question of septal infarct. Minimal voltage criteria for LVH versus normal variant.  Additional studies/ records that were reviewed personally reviewed by me today  include:  None available   ASSESSMENT AND PLAN:  Edema Need to evaluate for CHF. Recommend echocardiogram, BNP, BMP.  Chest pain Shortness of breath Risk factors for CAD are multiple including age, postmenopausal state, hypertension hyperlipidemia obesity. Recommend pharmacologic nuclear stress test.  Hypertension BP is well controlled. Continue monitoring BP. Continue current medical therapy and lifestyle changes.  Hyperlipidemia Patient is on statin therapy, PCP following labs.  Obesity Body mass index is 31.88 kg/(m^2).Marland Kitchen Recommend aggressive weight loss through diet and increased physical activity, once cardiac workup was done.  Current medicines are reviewed at length with the patient today.  The patient does not have concerns regarding medicines.  Labs/ tests ordered today include:  Orders Placed This Encounter  Procedures  . NM Myocar Multi W/Spect W/Wall Motion / EF  . Basic Metabolic Panel (BMET)  . B Nat Peptide  . EKG 12-Lead  . Echocardiogram    I had a lengthy and detailed discussion with the patient regarding diagnoses, prognosis, diagnostic options, treatment options , and side effects of medications.   I counseled the patient on importance of  lifestyle modification including heart healthy diet, regular physical activity .   Disposition:   FU with undersigned after tests    Signed, Almond Lint, MD  05/22/2015 10:57 PM    [facility]

## 2015-05-22 NOTE — Patient Instructions (Addendum)
Medication Instructions:  Your physician recommends that you continue on your current medications as directed. Please refer to the Current Medication list given to you today.   Labwork: Labs done today were BMP and BNP.   Testing/Procedures: Your physician has requested that you have an echocardiogram. Echocardiography is a painless test that uses sound waves to create images of your heart. It provides your doctor with information about the size and shape of your heart and how well your heart's chambers and valves are working. This procedure takes approximately one hour. There are no restrictions for this procedure.  Date & Time: _______________________________________________________  [facility]  Your caregiver has ordered a Stress Test with nuclear imaging. The purpose of this test is to evaluate the blood supply to your heart muscle. This procedure is referred to as a "Non-Invasive Stress Test." This is because other than having an IV started in your vein, nothing is inserted or "invades" your body. Cardiac stress tests are done to find areas of poor blood flow to the heart by determining the extent of coronary artery disease (CAD). Some patients exercise on a treadmill, which naturally increases the blood flow to your heart, while others who are  unable to walk on a treadmill due to physical limitations have a pharmacologic/chemical stress agent called Lexiscan . This medicine will mimic walking on a treadmill by temporarily increasing your coronary blood flow.   Please note: these test may take anywhere between 2-4 hours to complete  PLEASE REPORT TO [facility] MEDICAL MALL ENTRANCE  THE VOLUNTEERS AT THE FIRST DESK WILL DIRECT YOU WHERE TO GO  Date of Procedure:__Friday May 30, 2015 at 07:30 AM___________  Arrival Time for Procedure:__Arrive 07:15AM_____________    PLEASE NOTIFY THE OFFICE AT LEAST 24 HOURS IN ADVANCE IF YOU ARE UNABLE TO KEEP YOUR APPOINTMENT.  [phone] AND   PLEASE NOTIFY NUCLEAR MEDICINE AT [facility] AT LEAST 24 HOURS IN ADVANCE IF YOU ARE UNABLE TO KEEP YOUR APPOINTMENT. [phone]  How to prepare for your Myoview test:   Do not eat or drink after midnight  No caffeine for 24 hours prior to test  No smoking 24 hours prior to test.  Your medication may be taken with water.  If your doctor stopped a medication because of this test, do not take that medication.  Ladies, please do not wear dresses.  Skirts or pants are appropriate. Please wear a short sleeve shirt.  No perfume, cologne or lotion.  Wear comfortable walking shoes. No heels!    Follow-Up: Your physician recommends that you schedule a follow-up appointment after testing with Dr Alvino Chapel.  Date & Time: ______________________________________________________________   Any Other Special Instructions Will Be Listed Below (If Applicable).     If you need a refill on your cardiac medications before your next appointment, please call your pharmacy.  Pharmacologic Stress Electrocardiogram A pharmacologic stress electrocardiogram is a heart (cardiac) test that uses nuclear imaging to evaluate the blood supply to your heart. This test may also be called a pharmacologic stress electrocardiography. Pharmacologic means that a medicine is used to increase your heart rate and blood pressure.  This stress test is done to find areas of poor blood flow to the heart by determining the extent of coronary artery disease (CAD). Some people exercise on a treadmill, which naturally increases the blood flow to the heart. For those people unable to exercise on a treadmill, a medicine is used. This medicine stimulates your heart and will cause your heart to beat harder  and more quickly, as if you were exercising.  Pharmacologic stress tests can help determine:  The adequacy of blood flow to your heart during increased levels of activity in order to clear you for discharge home.  The extent of  coronary artery blockage caused by CAD.  Your prognosis if you have suffered a heart attack.  The effectiveness of cardiac procedures done, such as an angioplasty, which can increase the circulation in your coronary arteries.  Causes of chest pain or pressure. LET [facility] KNOW ABOUT:  Any allergies you have.  All medicines you are taking, including vitamins, herbs, eye drops, creams, and over-the-counter medicines.  Previous problems you or members of your family have had with the use of anesthetics.  Any blood disorders you have.  Previous surgeries you have had.  Medical conditions you have.  Possibility of pregnancy, if this applies.  If you are currently breastfeeding. RISKS AND COMPLICATIONS Generally, this is a safe procedure. However, as with any procedure, complications can occur. Possible complications include:  You develop pain or pressure in the following areas:  Chest.  Jaw or neck.  Between your shoulder blades.  Radiating down your left arm.  Headache.  Dizziness or light-headedness.  Shortness of breath.  Increased or irregular heartbeat.  Low blood pressure.  Nausea or vomiting.  Flushing.  Redness going up the arm and slight pain during injection of medicine.  Heart attack (rare). BEFORE THE PROCEDURE   Avoid all forms of caffeine for 24 hours before your test or as directed by your health care provider. This includes coffee, tea (even decaffeinated tea), caffeinated sodas, chocolate, cocoa, and certain pain medicines.  Follow your health care provider's instructions regarding eating and drinking before the test.  Take your medicines as directed at regular times with water unless instructed otherwise. Exceptions may include:  If you have diabetes, ask how you are to take your insulin or pills. It is common to adjust insulin dosing the morning of the test.  If you are taking beta-blocker medicines, it is important to  talk to your health care provider about these medicines well before the date of your test. Taking beta-blocker medicines may interfere with the test. In some cases, these medicines need to be changed or stopped 24 hours or more before the test.  If you wear a nitroglycerin patch, it may need to be removed prior to the test. Ask your health care provider if the patch should be removed before the test.  If you use an inhaler for any breathing condition, bring it with you to the test.  If you are an outpatient, bring a snack so you can eat right after the stress phase of the test.  Do not smoke for 4 hours prior to the test or as directed by your health care provider.  Do not apply lotions, powders, creams, or oils on your chest prior to the test.  Wear comfortable shoes and clothing. Let your health care provider know if you were unable to complete or follow the preparations for your test. PROCEDURE   Multiple patches (electrodes) will be put on your chest. If needed, small areas of your chest may be shaved to get better contact with the electrodes. Once the electrodes are attached to your body, multiple wires will be attached to the electrodes, and your heart rate will be monitored.  An IV access will be started. A nuclear trace (isotope) is given. The isotope may be given intravenously, or it  may be swallowed. Nuclear refers to several types of radioactive isotopes, and the nuclear isotope lights up the arteries so that the nuclear images are clear. The isotope is absorbed by your body. This results in low radiation exposure.  A resting nuclear image is taken to show how your heart functions at rest.  A medicine is given through the IV access.  A second scan is done about 1 hour after the medicine injection and determines how your heart functions under stress.  During this stress phase, you will be connected to an electrocardiogram machine. Your blood pressure and oxygen levels will be  monitored. AFTER THE PROCEDURE   Your heart rate and blood pressure will be monitored after the test.  You may return to your normal schedule, including diet,activities, and medicines, unless your health care provider tells you otherwise.   This information is not intended to replace advice given to you by your health care provider. Make sure you discuss any questions you have with your health care provider.   Document Released: 05/23/2008 Document Revised: 01/09/2013 Document Reviewed: 09/11/2012 Elsevier Interactive Patient Education 2016 [employer].   Echocardiogram An echocardiogram, or echocardiography, uses sound waves (ultrasound) to produce an image of your heart. The echocardiogram is simple, painless, obtained within a short period of time, and offers valuable information to your health care provider. The images from an echocardiogram can provide information such as:  Evidence of coronary artery disease (CAD).  Heart size.  Heart muscle function.  Heart valve function.  Aneurysm detection.  Evidence of a past heart attack.  Fluid buildup around the heart.  Heart muscle thickening.  Assess heart valve function. LET [facility] CARE PROVIDER KNOW ABOUT:  Any allergies you have.  All medicines you are taking, including vitamins, herbs, eye drops, creams, and over-the-counter medicines.  Previous problems you or members of your family have had with the use of anesthetics.  Any blood disorders you have.  Previous surgeries you have had.  Medical conditions you have.  Possibility of pregnancy, if this applies. BEFORE THE PROCEDURE  No special preparation is needed. Eat and drink normally.  PROCEDURE   In order to produce an image of your heart, gel will be applied to your chest and a wand-like tool (transducer) will be moved over your chest. The gel will help transmit the sound waves from the transducer. The sound waves will harmlessly bounce off your heart  to allow the heart images to be captured in real-time motion. These images will then be recorded.  You may need an IV to receive a medicine that improves the quality of the pictures. AFTER THE PROCEDURE You may return to your normal schedule including diet, activities, and medicines, unless your health care provider tells you otherwise.   This information is not intended to replace advice given to you by your health care provider. Make sure you discuss any questions you have with your health care provider.   Document Released: 01/02/2000 Document Revised: 01/25/2014 Document Reviewed: 09/11/2012 Elsevier Interactive Patient Education [employer]

## 2015-05-23 LAB — BASIC METABOLIC PANEL
BUN / CREAT RATIO: 22 (ref 12–28)
BUN: 19 mg/dL (ref 8–27)
CO2: 25 mmol/L (ref 18–29)
CREATININE: 0.88 mg/dL (ref 0.57–1.00)
Calcium: 9.2 mg/dL (ref 8.7–10.3)
Chloride: 104 mmol/L (ref 96–106)
GFR calc Af Amer: 80 mL/min/{1.73_m2} (ref >59)
GFR, EST NON AFRICAN AMERICAN: 70 mL/min/{1.73_m2} (ref >59)
GLUCOSE: 81 mg/dL (ref 65–99)
POTASSIUM: 4.1 mmol/L (ref 3.5–5.2)
SODIUM: 143 mmol/L (ref 134–144)

## 2015-05-23 LAB — BRAIN NATRIURETIC PEPTIDE: BNP: 88.5 pg/mL (ref 0.0–100.0)

## 2015-05-27 ENCOUNTER — Telehealth: Payer: Self-pay | Admitting: Cardiology

## 2015-05-27 NOTE — Telephone Encounter (Signed)
Patient called in and canceled stress test, echo, and follow up appointment. She states that she has just been overwhelmed with medical procedures lately and just needs to take a break. She also stated that she is returning to work on Monday and that she just wanted to wait for now but that when she is ready she will call back in to schedule everything. She was very nice, had no complaints, and verbalized understanding of our conversation.

## 2015-05-27 NOTE — Telephone Encounter (Signed)
Pt is calling stating she is returning back to work and can't keep her up coming appointments would like to cancel her Myoview/echo and fu She states she will call back when ready

## 2015-05-30 ENCOUNTER — Other Ambulatory Visit

## 2015-06-12 ENCOUNTER — Ambulatory Visit: Admitting: Cardiology

## 2015-07-11 ENCOUNTER — Telehealth: Payer: Self-pay | Admitting: Urology

## 2015-07-11 NOTE — Telephone Encounter (Signed)
Patient is having PT elsewhere for her shoulder and wants to complete before scheduling pelvic floor therapy.

## 2015-08-14 ENCOUNTER — Encounter: Payer: Self-pay | Admitting: Cardiology

## 2015-08-28 ENCOUNTER — Ambulatory Visit (INDEPENDENT_AMBULATORY_CARE_PROVIDER_SITE_OTHER): Admitting: Neurology

## 2015-08-28 ENCOUNTER — Encounter: Payer: Self-pay | Admitting: Neurology

## 2015-08-28 VITALS — BP 110/58 | HR 70 | Resp 16 | Ht 69.0 in | Wt 208.0 lb

## 2015-08-28 DIAGNOSIS — R29898 Other symptoms and signs involving the musculoskeletal system: Secondary | ICD-10-CM

## 2015-08-28 DIAGNOSIS — R32 Unspecified urinary incontinence: Secondary | ICD-10-CM | POA: Diagnosis not present

## 2015-08-28 DIAGNOSIS — R350 Frequency of micturition: Secondary | ICD-10-CM | POA: Diagnosis not present

## 2015-08-28 DIAGNOSIS — R413 Other amnesia: Secondary | ICD-10-CM

## 2015-08-28 NOTE — Progress Notes (Signed)
[facility]  PATIENT: Jessica Norman DOB: 1950-02-16  REFERRING DOCTOR OR PCP:  Darreld Mclean SOURCE: paitent and records from Dr. Sherryll Burger.     _________________________________   HISTORICAL  CHIEF COMPLAINT:  Chief Complaint  Patient presents with  . Follow-up    Rm 12. Patient states that she is having increased trouble with her balance. New onset of pain in her neck and pain, stiffness, and numbness in her fingers. States that her eye sight is worse and having trouble getting her words out.     HISTORY OF PRESENT ILLNESS:  Jessica Norman is a 65 yo woman recently seen for proximal weakness and memory difficulties.    She is feeling weaker again.  Weakness/numbness: She feels strength has worsened over the past couple months (gradual worsening)   She has more difficulty getting out of a chair and climbing stairs.   She is noting mild weakness in her arms.   At times her right arm seems to lock up (sometimes left) and she needs to use her other arm to move it -- this last 5-10 minutes and only in bed.   No ptosis or diplopia.   However, she feels there is a cloud over her vision. The acetylcholine receptor antibody was negative. Blocking antibodies and modulating antibodies were also negative. NCV showed mild right CTS (minimal left). I reviewed the MRI of the cervical spine from 06/23/2012. The spinal cord appears normal. She also notes numbness in the arm at times.       Headache:   She has pain in the occiput/base of skull at times, worse on the right.  At times, pain radiates forward causing a headache on that side    Shoulder pain:   She has bilateral shoulder pain and has seen orthopedics.    She has been told she needs surgery on her right.     Memory/insomnia: She is noting more trouble with memory over the past 2 years.   She loses her train of thought easily.  Memory is worse, she cannot remember passwords.    She also notes decreased  multitasking and decreased focus.  MRI of the brain2015 was abnormal showing frontal and parietal subcortical white matter foci. There were also white matter foci within the pons.  PSG at 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 She denies much daytime sleepiness. She wakes up quite a bit most nights and often has difficulty falling back asleep she uses the bathroom 3-4 times nightly.Gabapentin has helped her sleep pattern and she wakes up less.    Laboratory data:   ANA was positive (anti-Ro). She reports a dry mouth but not dry eyes. She is seing a rheumatologist who diagnosed her with Sjogren's.     Plaquenil was started and she tolerates it well.     She has some parotid swelling on her right.   She is going to have a procedure to see if there is a stone in her salivary gland.     She sees Dr. Lacretia Nicks. Lynann Beaver (psychiatrist [phone]) for bipolar disorder. She is on Zoloft and Depakote. .   I personally reviewed the MRI images from 07/10/2014 of the cervical spine and thoracic spine. Occur with the results below. Cervical Spine 07/10/14 IMPRESSION: This is an abnormal MRI of the cervical spine show the following: 1. Mild spinal stenosis and moderate left neural  foraminal narrowing at C5-C6 due to uncovertebral spurring and disc bulging, more to the left. Although there is no definite nerve root compression there is some encroachment on the exiting left C6 nerve root. 2. Prior ACDF at C3-C5. Neural foramina are widely patent at the 2 levels. At C4-C5 there is a minimal central bony ridge that effaces the thecal sac without causing spinal cord compression. 3. The spinal cord appears normal. 4.  Compared to an MRI dated 12/22/2009, there has been no definite interval change.  Thoracis Spine 07/10/14: IMPRESSION: This is an abnormal MRI of the thoracic  spine showing multilevel degenerative changes as detailed above there does not appear to be any spinal cord compression though the thecal sac is indented at T8-T9. There does not appear to be any nerve root impingement.    REVIEW OF SYSTEMS: Constitutional: No fevers, chills, sweats, or change in appetite Eyes: No visual changes, double vision, eye pain Ear, nose and throat: No hearing loss, ear pain, nasal congestion, sore throat Cardiovascular: No chest pain, palpitations Respiratory: No shortness of breath at rest or with exertion.   No wheezes GastrointestinaI: No nausea, vomiting, diarrhea, abdominal pain, fecal incontinence Genitourinary: No dysuria, urinary retention or frequency.  No nocturia. Musculoskeletal: No neck pain, back pain Integumentary: No rash, pruritus, skin lesions Neurological: as above Psychiatric: No depression at this time.  No anxiety Endocrine: No palpitations, diaphoresis, change in appetite, change in weigh or increased thirst Hematologic/Lymphatic: No anemia, purpura, petechiae. Allergic/Immunologic: No itchy/runny eyes, nasal congestion, recent allergic reactions, rashes  ALLERGIES: Allergies  Allergen Reactions  . Codeine Itching  . Penicillin G     Other reaction(s): ITCHING  . Sulfa Antibiotics Itching    HOME MEDICATIONS:  Current Outpatient Prescriptions:  .  aluminum hydroxide-magnesium carbonate (GAVISCON) 95-358 MG/15ML SUSP, Take by mouth., Disp: , Rfl:  .  amLODipine (NORVASC) 10 MG tablet, Take 10 mg by mouth daily. , Disp: , Rfl:  .  aspirin EC 81 MG tablet, Take 81 mg by mouth daily. , Disp: , Rfl:  .  dexlansoprazole (DEXILANT) 60 MG capsule, Take 60 mg by mouth daily. Reported on 05/06/2015, Disp: , Rfl:  .  dicyclomine (BENTYL) 10 MG capsule, Take by mouth. Reported on 05/06/2015, Disp: , Rfl:  .  divalproex (DEPAKOTE ER) 500 MG 24 hr tablet, Take 500 mg by mouth daily. , Disp: , Rfl:  .  esomeprazole (NEXIUM) 40 MG capsule,  Take 40 mg by mouth. , Disp: , Rfl:  .  fexofenadine (ALLEGRA) 180 MG tablet, Take by mouth., Disp: , Rfl:  .  Fluticasone-Salmeterol (ADVAIR) 250-50 MCG/DOSE AEPB, Inhale 1 puff into the lungs 2 (two) times daily. , Disp: , Rfl:  .  furosemide (LASIX) 40 MG tablet, Reported on 05/06/2015, Disp: , Rfl: 0 .  gabapentin (NEURONTIN) 100 MG capsule, Take 100 mg by mouth 2 (two) times daily. , Disp: , Rfl:  .  hydrochlorothiazide (HYDRODIURIL) 50 MG tablet, Take by mouth., Disp: , Rfl:  .  hydroxychloroquine (PLAQUENIL) 200 MG tablet, Take 200 mg by mouth daily. , Disp: , Rfl:  .  levothyroxine (SYNTHROID, LEVOTHROID) 50 MCG tablet, TK 1 T PO D FOR HYPOTHYROIDISM, Disp: , Rfl:  .  lovastatin (MEVACOR) 10 MG tablet, Take 10 mg by mouth at bedtime. , Disp: , Rfl:  .  nabumetone (RELAFEN) 500 MG tablet, Take 500 mg by mouth 2 (two) times daily. Reported on 05/06/2015, Disp: , Rfl:  .  OSPHENA 60 MG TABS, TK 1  T PO  D WITH FOOD, Disp: , Rfl: 11 .  QUEtiapine (SEROQUEL) 25 MG tablet, Reported on 05/06/2015, Disp: , Rfl: 1 .  rifaximin (XIFAXAN) 550 MG TABS tablet, Take by mouth. Reported on 05/06/2015, Disp: , Rfl:  .  sertraline (ZOLOFT) 100 MG tablet, Take 100 mg by mouth daily. , Disp: , Rfl:  .  sulindac (CLINORIL) 200 MG tablet, Take by mouth., Disp: , Rfl:   PAST MEDICAL HISTORY: Past Medical History:  Diagnosis Date  . Arthritis   . Asthma   . Bipolar disorder (HCC)   . GERD (gastroesophageal reflux disease)   . Hearing loss   . History of Sjogren's disease   . HLD (hyperlipidemia)   . Hypertension   . Hypothyroidism   . Incomplete bladder emptying   . Memory loss   . Obesity   . Recurrent UTI   . Vision abnormalities     PAST SURGICAL HISTORY: Past Surgical History:  Procedure Laterality Date  . ABDOMINAL HYSTERECTOMY    . BACK SURGERY    . CARPAL TUNNEL RELEASE Right   . EXCISION OF TONGUE LESION N/A 12/19/2014   Procedure: EXCISION OF TONGUE LESION;  Surgeon: Bud Facereighton Vaught,  MD;  Location: [facility] ORS;  Service: ENT;  Laterality: N/A;  . EYE SURGERY     cataracts  . FOOT SURGERY    . NECK SURGERY    . SHOULDER ARTHROSCOPY WITH OPEN ROTATOR CUFF REPAIR Left 01/16/2015   Procedure: SHOULDER ARTHROSCOPY WITH mini OPEN ROTATOR CUFF REPAIR,subacromial decompression, abrasion chondrop;asty with microfracture of glenoid, excision distal clavicle, biceps tenodesis, extensive debridement;  Surgeon: Christena FlakeJohn J Poggi, MD;  Location: [facility] ORS;  Service: Orthopedics;  Laterality: Left;  Marland Kitchen. VESICOVAGINAL FISTULA CLOSURE W/ TAH      FAMILY HISTORY: Family History  Problem Relation Age of Onset  . Diabetes Mother   . Stroke Mother   . COPD Father   . Tuberculosis Father   . Kidney disease Neg Hx   . Bladder Cancer Neg Hx     SOCIAL HISTORY:  Social History   Social History  . Marital status: Married    Spouse name: N/A  . Number of children: N/A  . Years of education: N/A   Occupational History  . Not on file.   Social History Main Topics  . Smoking status: Former Smoker    Quit date: 01/13/1965  . Smokeless tobacco: Never Used  . Alcohol use No     Comment: occ  . Drug use: No  . Sexual activity: Not on file   Other Topics Concern  . Not on file   Social History Narrative  . No narrative on file     PHYSICAL EXAM  Vitals:   08/28/15 1125  BP: (!) 110/58  Pulse: 70  Resp: 16  Weight: 208 lb (94.3 kg)  Height: 5\' 9"  (1.753 m)    Body mass index is 30.72 kg/m.   General: The patient is well-developed and well-nourished and in no acute distress  Neck: The neck is supple, no carotid bruits are noted.  The neck is nontender.  Musculoskeletal:  Back is nontender  Neurologic Exam  Mental status: The patient is alert and oriented x 3 at the time of the examination. The patient has apparent mildly reduced remote but poor recent memory (2/3 after 3 minutes; 3/3 after category prompt), with a reduced attention span and concentration ability  ([id number]; WORLD-DLROW).    Speech is normal.  Cranial nerves: Extraocular movements are  full. No ptosis.   Facial symmetry is present. There is good facial sensation to soft touch bilaterally.Facial strength is normal.  Trapezius and sternocleidomastoid strength is normal. No dysarthria is noted.  The tongue is midline, and the patient has symmetric elevation of the soft palate. No obvious hearing deficits are noted.  Motor:  Muscle bulk is normal.   Tone is normal. Strength is  5 / 5 in all 4 extremities except prox leg (4- to 4/5). Functionally, she cannot raise from chair without using at least one arm.   Can squat and rise  Sensory: Sensory testing is intact to pinprick, soft touch and vibration sensation in all 4 extremities.  Coordination: Cerebellar testing reveals good finger-nose-finger.  Gait and station: Station is normal.   Gait is arthritic. Tandem gait is wide. Romberg is negative.   Reflexes: Deep tendon reflexes are symmetric and she has spread at her knees and normal DTRs elsewhere   Plantar responses are flexor.    DIAGNOSTIC DATA (LABS, IMAGING, TESTING) - I reviewed patient records, labs, notes, testing and imaging myself where available.  Lab Results  Component Value Date   WBC 5.1 02/03/2015   HGB 12.3 02/03/2015   HCT 38.2 02/03/2015   MCV 88.9 02/03/2015   PLT 159 02/03/2015      Component Value Date/Time   NA 143 05/22/2015 1033   K 4.1 05/22/2015 1033   CL 104 05/22/2015 1033   CO2 25 05/22/2015 1033   GLUCOSE 81 05/22/2015 1033   GLUCOSE 88 02/03/2015 1054   BUN 19 05/22/2015 1033   CREATININE 0.88 05/22/2015 1033   CALCIUM 9.2 05/22/2015 1033   PROT 7.0 11/25/2014 1116   ALBUMIN 3.8 11/25/2014 1116   AST 18 11/25/2014 1116   ALT 11 11/25/2014 1116   ALKPHOS 56 11/25/2014 1116   BILITOT 0.3 11/25/2014 1116   GFRNONAA 70 05/22/2015 1033   GFRAA 80 05/22/2015 1033       ASSESSMENT AND PLAN  Proximal leg weakness - Plan: CK,  Aldolase, T4 AND TSH, Striated Muscle Antibody, MR Brain Wo Contrast  Memory loss - Plan: T4 AND TSH, MR Brain Wo Contrast  Urinary frequency - Plan: MR Brain Wo Contrast  Incontinence    1.     Her proximal weakness is worse again.   Unclear etiology.       Her MRIs do not show any spinal cord pathology.  Negative AchR Ab.    CK was slightly elevated in the past so I will check again. Will also check anti-striation antibodies and TSH. 2.    Memory problems appear to be mostly decreased focus.   MRI of the brain 2015 showed chronic microvessel ischemic changes. Check Brain MRI imaging study compare to last one 2 years ago to make sure that there is not normal pressure hydrocephalus or other new issues. 3.    Continue other medications.   Return to clinic in 4 months or sooner if there are new or worsening neurologic symptoms.   Chantale Leugers A. Epimenio Foot, MD, PhD 08/28/2015, 12:52 PM Certified in Neurology, Clinical Neurophysiology, Sleep Medicine, Pain Medicine and Neuroimaging  [facility] [address] [place], [place] [address] [phone]

## 2015-08-29 LAB — T4 AND TSH
T4, Total: 6 ug/dL (ref 4.5–12.0)
TSH: 2.49 u[IU]/mL (ref 0.450–4.500)

## 2015-08-29 LAB — CK: Total CK: 101 U/L (ref 24–173)

## 2015-08-29 LAB — ALDOLASE: Aldolase: 5 U/L (ref 3.3–10.3)

## 2015-08-29 LAB — STRIATED MUSCLE ANTIBODY: ANTI-STRIATION ABS: NEGATIVE

## 2015-09-02 ENCOUNTER — Telehealth: Payer: Self-pay | Admitting: *Deleted

## 2015-09-02 NOTE — Telephone Encounter (Signed)
-----   Message from Asa Lenteichard A Sater, MD sent at 09/01/2015 11:58 AM EDT ----- Please let her know that the additional blood work was also negative.   Would she like a referral to physical therapy for her leg weakness?

## 2015-09-02 NOTE — Telephone Encounter (Signed)
I have spoken with Jessica Norman this afternoon and per RAS, advised that additional labwork is also negative.  I have offered PT for leg weakness, but she declined, stating she is already participating in PT/fim

## 2015-09-20 ENCOUNTER — Ambulatory Visit
Admission: RE | Admit: 2015-09-20 | Discharge: 2015-09-20 | Disposition: A | Discharge status: Home / Self Care | Payer: Medicare Other | Source: Ambulatory Visit | Attending: Neurology | Admitting: Neurology

## 2015-09-20 DIAGNOSIS — R350 Frequency of micturition: Secondary | ICD-10-CM

## 2015-09-20 DIAGNOSIS — R29898 Other symptoms and signs involving the musculoskeletal system: Secondary | ICD-10-CM

## 2015-09-20 DIAGNOSIS — R413 Other amnesia: Secondary | ICD-10-CM | POA: Diagnosis not present

## 2015-09-23 ENCOUNTER — Telehealth: Payer: Self-pay | Admitting: *Deleted

## 2015-09-23 NOTE — Telephone Encounter (Signed)
-----   Message from Asa Lenteichard A Sater, MD sent at 09/22/2015  5:59 PM EDT ----- Please let her know that the MRI of the brain shows age related changes,  more than expected for her age.   These changes sometimes affect memory and walking

## 2015-09-23 NOTE — Telephone Encounter (Signed)
I have spoken with Jessica Hashimotoatricia this morning and per RAS, advised that MRI brain did show more age related changes than he would expect of someone her age.  These changes may affect memory/gait.  RAS can discuss further at f/u visit.  She verbalized understanding of same/fim

## 2015-09-29 ENCOUNTER — Other Ambulatory Visit: Payer: Self-pay | Admitting: Family Medicine

## 2015-09-29 DIAGNOSIS — Z789 Other specified health status: Secondary | ICD-10-CM

## 2015-10-27 ENCOUNTER — Ambulatory Visit: Payer: Medicare Other | Attending: Family Medicine

## 2015-11-25 ENCOUNTER — Encounter: Payer: Self-pay | Admitting: Neurology

## 2015-11-25 ENCOUNTER — Ambulatory Visit (INDEPENDENT_AMBULATORY_CARE_PROVIDER_SITE_OTHER): Payer: Medicare Other | Admitting: Neurology

## 2015-11-25 VITALS — BP 114/64 | HR 68 | Resp 6 | Ht 69.0 in | Wt 199.5 lb

## 2015-11-25 DIAGNOSIS — R32 Unspecified urinary incontinence: Secondary | ICD-10-CM | POA: Diagnosis not present

## 2015-11-25 DIAGNOSIS — R4189 Other symptoms and signs involving cognitive functions and awareness: Secondary | ICD-10-CM | POA: Diagnosis not present

## 2015-11-25 DIAGNOSIS — M6289 Other specified disorders of muscle: Secondary | ICD-10-CM | POA: Diagnosis not present

## 2015-11-25 MED ORDER — OXYBUTYNIN CHLORIDE ER 10 MG PO TB24: 10.0000 mg | ORAL_TABLET | Freq: Every day | ORAL | 11 refills | Status: DC

## 2015-11-25 NOTE — Progress Notes (Signed)
[facility]  PATIENT: Jessica Norman DOB: 05/02/1950  REFERRING DOCTOR OR PCP:  Darreld McleanLinda Tesch SOURCE: paitent and records from Dr. Sherryll BurgerShah.     _________________________________   HISTORICAL  CHIEF COMPLAINT:  Chief Complaint  Patient presents with  . Extremity Weakness    Sts. she continues to have intermittent bilat leg weakness (left worse than right).  Sts. episodes of weakness occur about once a week and last for up to a day, then resolve with rest.  Sts. memory is about the same.  Sts. she still occasionally loses her train of thought, has trouble with names.  Would like to discuss MRI brain resuls in greater detail/fim  . Memory Loss    HISTORY OF PRESENT ILLNESS:  Jessica Norman is a 65 yo woman with proximal weakness and memory difficulties.    She reports episodes of weakness occurring for hours to 1 day every week or so. With rest she does better. She feels memory is doing about the same and she often loses her train of thought.     I reviewed the MRI of the brain from 09/20/2015 in her presence. It shows moderate chronic microvascular ischemic changes and atrophy. When compared to the MRI dated 10/10/2013, there has been an increase in ventricle size that may be out of proportion to the increase in atrophy.  Weakness/numbness: She feels strength has gradually worsened   She has more difficulty getting out of a chair and climbing stairs.  Sometimes she gets episodes of further weakness.   Sometimes, she feels stride is reduced.    She is noting mild weakness in her arms but no changes since last visit.   She gets some pain and numbness in her arms.  Painful numbness sometimes wakes her up.     No ptosis or diplopia.     The acetylcholine receptor antibody was negative. Blocking antibodies and modulating antibodies were also negative. NCV showed mild right CTS (minimal left).On the MRI of the cervical spine from 06/23/2012, the spinal cord appears  normal.      Bladder:  She is noting more urinary frequency and urgency. She has to wake up a lot at night to use the bathroom. She occasionally has incontinence. This is all new this year.  Memory : She is noting more trouble with memory over the past 2 years.   She loses her train of thought easily.  Memory is worse, she cannot remember passwords.    She also notes decreased multitasking and decreased focus.  MRI of the brain9/2017 is abnormal showing frontal and parietal subcortical white matter foci. There were also white matter foci within the pons.  Ventricles are larger than 2015 and unclear if NPH or atrophy.    Sleep/insomnia:  She wakes up a lot at night to use the bathroom.  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 She denies much daytime sleepiness. She wakes up quite a bit most nights and often has difficulty falling back asleep she uses the bathroom 3-4 times nightly.Gabapentin has helped her sleep pattern and she wakes up less.    Laboratory data:   ANA was positive (anti-Ro). She reports a dry mouth but not dry eyes. She is seing a rheumatologist who diagnosed her with Sjogren's.     Plaquenil was started and she tolerates it well.       She  sees Dr. Sara ChuW. James Ryan (psychiatrist [phone]) for bipolar disorder. She is on Zoloft and Depakote. Marland Kitchen.    REVIEW OF SYSTEMS: Constitutional: No fevers, chills, sweats, or change in appetite Eyes: No visual changes, double vision, eye pain Ear, nose and throat: No hearing loss, ear pain, nasal congestion, sore throat Cardiovascular: No chest pain, palpitations Respiratory: No shortness of breath at rest or with exertion.   No wheezes GastrointestinaI: No nausea, vomiting, diarrhea, abdominal pain, fecal incontinence Genitourinary: No dysuria, urinary retention or frequency.  No  nocturia. Musculoskeletal: No neck pain, back pain Integumentary: No rash, pruritus, skin lesions Neurological: as above Psychiatric: No depression at this time.  No anxiety Endocrine: No palpitations, diaphoresis, change in appetite, change in weigh or increased thirst Hematologic/Lymphatic: No anemia, purpura, petechiae. Allergic/Immunologic: No itchy/runny eyes, nasal congestion, recent allergic reactions, rashes  ALLERGIES: Allergies  Allergen Reactions  . Codeine Itching  . Penicillin G     Other reaction(s): ITCHING  . Sulfa Antibiotics Itching    HOME MEDICATIONS:  Current Outpatient Prescriptions:  .  aluminum hydroxide-magnesium carbonate (GAVISCON) 95-358 MG/15ML SUSP, Take by mouth., Disp: , Rfl:  .  amLODipine (NORVASC) 10 MG tablet, Take 10 mg by mouth daily. , Disp: , Rfl:  .  aspirin EC 81 MG tablet, Take 81 mg by mouth daily. , Disp: , Rfl:  .  dexlansoprazole (DEXILANT) 60 MG capsule, Take 60 mg by mouth daily. Reported on 05/06/2015, Disp: , Rfl:  .  dicyclomine (BENTYL) 10 MG capsule, Take by mouth. Reported on 05/06/2015, Disp: , Rfl:  .  divalproex (DEPAKOTE ER) 500 MG 24 hr tablet, Take 500 mg by mouth daily. , Disp: , Rfl:  .  esomeprazole (NEXIUM) 40 MG capsule, Take 40 mg by mouth. , Disp: , Rfl:  .  fexofenadine (ALLEGRA) 180 MG tablet, Take by mouth., Disp: , Rfl:  .  Fluticasone-Salmeterol (ADVAIR) 250-50 MCG/DOSE AEPB, Inhale 1 puff into the lungs 2 (two) times daily. , Disp: , Rfl:  .  furosemide (LASIX) 40 MG tablet, Reported on 05/06/2015, Disp: , Rfl: 0 .  gabapentin (NEURONTIN) 100 MG capsule, Take 100 mg by mouth 2 (two) times daily. , Disp: , Rfl:  .  hydrochlorothiazide (HYDRODIURIL) 50 MG tablet, Take by mouth., Disp: , Rfl:  .  hydroxychloroquine (PLAQUENIL) 200 MG tablet, Take 200 mg by mouth daily. , Disp: , Rfl:  .  levothyroxine (SYNTHROID, LEVOTHROID) 50 MCG tablet, TK 1 T PO D FOR HYPOTHYROIDISM, Disp: , Rfl:  .  lovastatin (MEVACOR) 10  MG tablet, Take 10 mg by mouth at bedtime. , Disp: , Rfl:  .  nabumetone (RELAFEN) 500 MG tablet, Take 500 mg by mouth 2 (two) times daily. Reported on 05/06/2015, Disp: , Rfl:  .  OSPHENA 60 MG TABS, TK 1 T PO  D WITH FOOD, Disp: , Rfl: 11 .  sertraline (ZOLOFT) 100 MG tablet, Take 100 mg by mouth daily. , Disp: , Rfl:  .  oxybutynin (DITROPAN XL) 10 MG 24 hr tablet, Take 1 tablet (10 mg total) by mouth at bedtime., Disp: 60 tablet, Rfl: 11 .  QUEtiapine (SEROQUEL) 25 MG tablet, Reported on 05/06/2015, Disp: , Rfl: 1 .  rifaximin (XIFAXAN) 550 MG TABS tablet, Take by mouth. Reported on 05/06/2015, Disp: , Rfl:  .  sulindac (CLINORIL) 200 MG tablet, Take by mouth., Disp: , Rfl:   PAST MEDICAL HISTORY: Past Medical History:  Diagnosis Date  . Arthritis   . Asthma   . Bipolar  disorder (HCC)   . GERD (gastroesophageal reflux disease)   . Hearing loss   . History of Sjogren's disease   . HLD (hyperlipidemia)   . Hypertension   . Hypothyroidism   . Incomplete bladder emptying   . Memory loss   . Obesity   . Recurrent UTI   . Vision abnormalities     PAST SURGICAL HISTORY: Past Surgical History:  Procedure Laterality Date  . ABDOMINAL HYSTERECTOMY    . BACK SURGERY    . CARPAL TUNNEL RELEASE Right   . EXCISION OF TONGUE LESION N/A 12/19/2014   Procedure: EXCISION OF TONGUE LESION;  Surgeon: Bud Face, MD;  Location: [facility] ORS;  Service: ENT;  Laterality: N/A;  . EYE SURGERY     cataracts  . FOOT SURGERY    . NECK SURGERY    . SHOULDER ARTHROSCOPY WITH OPEN ROTATOR CUFF REPAIR Left 01/16/2015   Procedure: SHOULDER ARTHROSCOPY WITH mini OPEN ROTATOR CUFF REPAIR,subacromial decompression, abrasion chondrop;asty with microfracture of glenoid, excision distal clavicle, biceps tenodesis, extensive debridement;  Surgeon: Christena Flake, MD;  Location: [facility] OR[facility];  Service: Orthopedics;  Laterality: Left;  Marland Kitchen VESICOVAGINAL FISTULA CLOSURE W/ TAH      FAMILY HISTORY: Family History   Problem Relation Age of Onset  . Diabetes Mother   . Stroke Mother   . COPD Father   . Tuberculosis Father   . Kidney disease Neg Hx   . Bladder Cancer Neg Hx     SOCIAL HISTORY:  Social History   Social History  . Marital status: Married    Spouse name: N/A  . Number of children: N/A  . Years of education: N/A   Occupational History  . Not on file.   Social History Main Topics  . Smoking status: Former Smoker    Quit date: 01/13/1965  . Smokeless tobacco: Never Used  . Alcohol use No     Comment: occ  . Drug use: No  . Sexual activity: Not on file   Other Topics Concern  . Not on file   Social History Narrative  . No narrative on file     PHYSICAL EXAM  Vitals:   11/25/15 0919  BP: 114/64  Pulse: 68  Resp: (!) 6  Weight: 199 lb 8 oz (90.5 kg)  Height: 5\' 9"  (1.753 m)    Body mass index is 29.46 kg/m.   General: The patient is well-developed and well-nourished and in no acute distress  Neck: The neck is supple, no carotid bruits are noted.  The neck is nontender.  Musculoskeletal:  Back is nontender  Neurologic Exam  Mental status: The patient is alert and oriented x 3 at the time of the examination. The patient has apparent mildly reduced remote but poor recent memory (2/3 after 3 minutes; 3/3 after category prompt), with a reduced attention span and concentration ability ([id number]; WORLD-DLROW).    Speech is normal.  Cranial nerves: Extraocular movements are full. No ptosis.   Facial symmetry is present. There is good facial sensation to soft touch bilaterally.Facial strength is normal.  Trapezius and sternocleidomastoid strength is normal. No dysarthria is noted.  The tongue is midline, and the patient has symmetric elevation of the soft palate. No obvious hearing deficits are noted.  Motor:  Muscle bulk is normal.   Tone is normal. Strength is  5 / 5 in all 4 extremities except prox leg (4- to 4/5). Functionally, she cannot raise from  chair without using at least one arm.  Can squat and rise  Sensory: Sensory testing is intact to pinprick, soft touch and vibration sensation in all 4 extremities.  Coordination: Cerebellar testing reveals good finger-nose-finger.  Gait and station: Station is normal.   Gait is arthritic. Tandem gait is wide. Romberg is negative.   Reflexes: Deep tendon reflexes are symmetric and she has spread at her knees and normal DTRs elsewhere   Plantar responses are flexor.    DIAGNOSTIC DATA (LABS, IMAGING, TESTING) - I reviewed patient records, labs, notes, testing and imaging myself where available.  Lab Results  Component Value Date   WBC 5.1 02/03/2015   HGB 12.3 02/03/2015   HCT 38.2 02/03/2015   MCV 88.9 02/03/2015   PLT 159 02/03/2015      Component Value Date/Time   NA 143 05/22/2015 1033   K 4.1 05/22/2015 1033   CL 104 05/22/2015 1033   CO2 25 05/22/2015 1033   GLUCOSE 81 05/22/2015 1033   GLUCOSE 88 02/03/2015 1054   BUN 19 05/22/2015 1033   CREATININE 0.88 05/22/2015 1033   CALCIUM 9.2 05/22/2015 1033   PROT 7.0 11/25/2014 1116   ALBUMIN 3.8 11/25/2014 1116   AST 18 11/25/2014 1116   ALT 11 11/25/2014 1116   ALKPHOS 56 11/25/2014 1116   BILITOT 0.3 11/25/2014 1116   GFRNONAA 70 05/22/2015 1033   GFRAA 80 05/22/2015 1033       ASSESSMENT AND PLAN  Proximal limb weakness  Urinary incontinence, unspecified type  Disturbed cognition   1.     MRI of the brain shows a progression in the size of her ventricles. The progression in ventricle size seems a little out of proportion to the progression of atrophy and normal pressure hydrocephalus may be playing a role in some of her symptoms. It is just slightly reduced but she does have a lot of of bladder difficulties including incontinence. We discussed the options and she would like to hold off on a high volume LP at this point. I will start a medication for her bladder and see her back in about 6 months. However,  she is advised to call me sooner if she has any change in her stride or if the bladder worsens further. 2.    Memory problems appear to be mostly decreased focus.   I am uncertain if this is related to her other issues are not. The cognitive changes do not appear to be severe enough to treat these focus could certainly be due to some of her medications and worsening sleep as well. 3.    Ditropan for bladder.   If not better, consider Myrbetriq.  Continue other medications.   Return to clinic in 6 months or sooner if there are new or worsening neurologic symptoms.   Mahrosh Donnell A. Epimenio Foot, MD, PhD 11/25/2015, 9:53 AM Certified in Neurology, Clinical Neurophysiology, Sleep Medicine, Pain Medicine and Neuroimaging  [facility] [address], [address] [place], [place] [address] [phone]

## 2016-05-25 ENCOUNTER — Encounter (INDEPENDENT_AMBULATORY_CARE_PROVIDER_SITE_OTHER): Payer: Self-pay

## 2016-05-25 ENCOUNTER — Ambulatory Visit (INDEPENDENT_AMBULATORY_CARE_PROVIDER_SITE_OTHER): Payer: Medicare Other | Admitting: Neurology

## 2016-05-25 ENCOUNTER — Encounter: Payer: Self-pay | Admitting: Neurology

## 2016-05-25 VITALS — BP 146/86 | HR 63 | Resp 16 | Ht 69.0 in | Wt 201.0 lb

## 2016-05-25 DIAGNOSIS — R269 Unspecified abnormalities of gait and mobility: Secondary | ICD-10-CM | POA: Diagnosis not present

## 2016-05-25 DIAGNOSIS — R9089 Other abnormal findings on diagnostic imaging of central nervous system: Secondary | ICD-10-CM | POA: Diagnosis not present

## 2016-05-25 DIAGNOSIS — R32 Unspecified urinary incontinence: Secondary | ICD-10-CM | POA: Diagnosis not present

## 2016-05-25 DIAGNOSIS — R4189 Other symptoms and signs involving cognitive functions and awareness: Secondary | ICD-10-CM | POA: Diagnosis not present

## 2016-05-25 MED ORDER — CYCLOBENZAPRINE HCL 5 MG PO TABS: 5.0000 mg | ORAL_TABLET | Freq: Every day | ORAL | 5 refills | Status: DC

## 2016-05-25 NOTE — Progress Notes (Signed)
[facility]  PATIENT: Jessica Norman DOB: 04-Nov-1950  REFERRING DOCTOR OR PCP:  Darreld Mclean SOURCE: paitent and records from Dr. Sherryll Burger.     _________________________________   HISTORICAL  CHIEF COMPLAINT:  Chief Complaint  Patient presents with  . Extremity Weakness    Sts. bilat leg weakness is still intermittent, right leg is now worse than the left.  Sts. right arm numbness/weakness is worse, esp at night/fim    HISTORY OF PRESENT ILLNESS:  Jessica Norman is a 66 yo woman with proximal weakness and memory difficulties.      She feels her gait issues come and go and sometimes her walking is much better than other times. Down episodes last 1 to 7 days.     She has some stumbles but no recent falls.   She always feels better with rest.   When she feels weaker or gait is worse, she avoids driving.      She feels strength is stable.  She sometimes has difficulty getting out of a chair and climbing stairs.      The acetylcholine receptor antibody was negative. Blocking antibodies and modulating antibodies were also negative. NCV showed mild right CTS (minimal left).On the MRI of the cervical spine from 06/23/2012, the spinal cord appears normal.      She notes mild blurry vision and she sees ophtamology on a regular basis.  She has had cataracts removed and vision was better, though recently, she gets spells of blurry vision.    She feels memory is doing about the same and she often loses her train of thought.   She will occasionally be a little forgetful but she does not feel that this has worsened over the last year.     She urinary frequency and urgency and has a lot of urinary incontinence and now wears Depends.   It is worse when episodes of  decreased walking occur.   She feels sometimes she does not completely empty  I have reviewed the MRI of the brain from 09/20/2015 in her presence. It shows moderate chronic microvascular ischemic changes and  atrophy. When compared to the MRI dated 10/10/2013, there has been an increase in ventricle size that may be out of proportion to the increase in atrophy.   Sleep/insomnia:  She wakes up a lot at night to use the bathroom.  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 She denies much daytime sleepiness. She wakes up quite a bit most nights and often has difficulty falling back asleep she uses the bathroom 3-4 times nightly.Gabapentin has helped her sleep pattern and she wakes up less.   Other:   ANA was positive (anti-Ro). She reports a dry mouth but not dry eyes. She is seing a rheumatologist who diagnosed her with Sjogren's.     Plaquenil was started and she tolerates it well.     She sees Dr. Lacretia Nicks. Lynann Beaver (psychiatrist [phone]) for bipolar disorder. She is on Zoloft and Depakote. Marland Kitchen    REVIEW OF SYSTEMS: Constitutional: No fevers, chills, sweats, or change in appetite.  She has insomnia Eyes: No visual changes, double vision, eye pain Ear, nose and throat: No hearing loss, ear pain, nasal congestion, sore throat Cardiovascular: No chest pain, palpitations Respiratory: No shortness of breath at rest or with exertion.   No wheezes GastrointestinaI: No nausea, vomiting, diarrhea, abdominal pain,  fecal incontinence Genitourinary: She has urinary frequency, urgency and incontinence. Musculoskeletal: No neck pain, back pain Integumentary: No rash, pruritus, skin lesions Neurological: as above Psychiatric: Bipolar Endocrine: No palpitations, diaphoresis, change in appetite, change in weigh or increased thirst Hematologic/Lymphatic: No anemia, purpura, petechiae. Allergic/Immunologic: No itchy/runny eyes, nasal congestion, recent allergic reactions, rashes  ALLERGIES: Allergies  Allergen Reactions  . Codeine Itching  . Penicillin G     Other reaction(s):  ITCHING  . Sulfa Antibiotics Itching    HOME MEDICATIONS:  Current Outpatient Prescriptions:  .  aluminum hydroxide-magnesium carbonate (GAVISCON) 95-358 MG/15ML SUSP, Take by mouth., Disp: , Rfl:  .  amLODipine (NORVASC) 10 MG tablet, Take 10 mg by mouth daily. , Disp: , Rfl:  .  aspirin EC 81 MG tablet, Take 81 mg by mouth daily. , Disp: , Rfl:  .  dexlansoprazole (DEXILANT) 60 MG capsule, Take 60 mg by mouth daily. Reported on 05/06/2015, Disp: , Rfl:  .  dicyclomine (BENTYL) 10 MG capsule, Take by mouth. Reported on 05/06/2015, Disp: , Rfl:  .  divalproex (DEPAKOTE ER) 500 MG 24 hr tablet, Take 500 mg by mouth daily. , Disp: , Rfl:  .  esomeprazole (NEXIUM) 40 MG capsule, Take 40 mg by mouth. , Disp: , Rfl:  .  fexofenadine (ALLEGRA) 180 MG tablet, Take by mouth., Disp: , Rfl:  .  furosemide (LASIX) 40 MG tablet, Reported on 05/06/2015, Disp: , Rfl: 0 .  gabapentin (NEURONTIN) 100 MG capsule, Take 100 mg by mouth 2 (two) times daily. , Disp: , Rfl:  .  hydrochlorothiazide (HYDRODIURIL) 50 MG tablet, Take by mouth., Disp: , Rfl:  .  hydroxychloroquine (PLAQUENIL) 200 MG tablet, Take 200 mg by mouth daily. , Disp: , Rfl:  .  levothyroxine (SYNTHROID, LEVOTHROID) 50 MCG tablet, TK 1 T PO D FOR HYPOTHYROIDISM, Disp: , Rfl:  .  lovastatin (MEVACOR) 10 MG tablet, Take 10 mg by mouth at bedtime. , Disp: , Rfl:  .  nabumetone (RELAFEN) 500 MG tablet, Take 500 mg by mouth 2 (two) times daily. Reported on 05/06/2015, Disp: , Rfl:  .  OSPHENA 60 MG TABS, TK 1 T PO  D WITH FOOD, Disp: , Rfl: 11 .  oxybutynin (DITROPAN XL) 10 MG 24 hr tablet, Take 1 tablet (10 mg total) by mouth at bedtime., Disp: 60 tablet, Rfl: 11 .  QUEtiapine (SEROQUEL) 25 MG tablet, Reported on 05/06/2015, Disp: , Rfl: 1 .  rifaximin (XIFAXAN) 550 MG TABS tablet, Take by mouth. Reported on 05/06/2015, Disp: , Rfl:  .  sertraline (ZOLOFT) 100 MG tablet, Take 100 mg by mouth daily. , Disp: , Rfl:  .  cyclobenzaprine (FLEXERIL) 5 MG  tablet, Take 1 tablet (5 mg total) by mouth at bedtime., Disp: 30 tablet, Rfl: 5  PAST MEDICAL HISTORY: Past Medical History:  Diagnosis Date  . Arthritis   . Asthma   . Bipolar disorder (HCC)   . GERD (gastroesophageal reflux disease)   . Hearing loss   . History of Sjogren's disease   . HLD (hyperlipidemia)   . Hypertension   . Hypothyroidism   . Incomplete bladder emptying   . Memory loss   . Obesity   . Recurrent UTI   . Vision abnormalities     PAST SURGICAL HISTORY: Past Surgical History:  Procedure Laterality Date  . ABDOMINAL HYSTERECTOMY    . BACK SURGERY    . CARPAL TUNNEL RELEASE Right   . EXCISION OF TONGUE LESION N/A 12/19/2014  Procedure: EXCISION OF TONGUE LESION;  Surgeon: Bud Face, MD;  Location: [facility] ORS;  Service: ENT;  Laterality: N/A;  . EYE SURGERY     cataracts  . FOOT SURGERY    . NECK SURGERY    . SHOULDER ARTHROSCOPY WITH OPEN ROTATOR CUFF REPAIR Left 01/16/2015   Procedure: SHOULDER ARTHROSCOPY WITH mini OPEN ROTATOR CUFF REPAIR,subacromial decompression, abrasion chondrop;asty with microfracture of glenoid, excision distal clavicle, biceps tenodesis, extensive debridement;  Surgeon: Christena Flake, MD;  Location: [facility];  Service: Orthopedics;  Laterality: Left;  Marland Kitchen VESICOVAGINAL FISTULA CLOSURE W/ TAH      FAMILY HISTORY: Family History  Problem Relation Age of Onset  . Diabetes Mother   . Stroke Mother   . COPD Father   . Tuberculosis Father   . Kidney disease Neg Hx   . Bladder Cancer Neg Hx     SOCIAL HISTORY:  Social History   Social History  . Marital status: Married    Spouse name: N/A  . Number of children: N/A  . Years of education: N/A   Occupational History  . Not on file.   Social History Main Topics  . Smoking status: Former Smoker    Quit date: 01/13/1965  . Smokeless tobacco: Never Used  . Alcohol use No     Comment: occ  . Drug use: No  . Sexual activity: Not on file   Other Topics Concern  .  Not on file   Social History Narrative  . No narrative on file     PHYSICAL EXAM  Vitals:   05/25/16 1018  BP: (!) 146/86  Pulse: 63  Resp: 16  Weight: 201 lb (91.2 kg)  Height: 5\' 9"  (1.753 m)    Body mass index is 29.68 kg/m.   General: The patient is well-developed and well-nourished and in no acute distress  Neck: The neck is supple, no carotid bruits are noted.  The neck is nontender.  Musculoskeletal:  Back is nontender  Neurologic Exam  Mental status: The patient is alert and oriented x 3 at the time of the examination. The patient has apparent mildly reduced remote but poor recent memory (2/3 after 3 minutes; 3/3 after category prompt), with a mildly reduced attention span. Speech is normal.  Cranial nerves: Extraocular movements are full. No ptosis.   Facial symmetry is present. There is good facial sensation to soft touch bilaterally.Facial strength is normal.  Trapezius and sternocleidomastoid strength is normal. No dysarthria is noted.  The tongue is midline, and the patient has symmetric elevation of the soft palate. No obvious hearing deficits are noted.  Motor:  Muscle bulk is normal.   Tone is normal. Strength is  5 / 5 in all 4 extremities except prox leg (4+/5). Functionally, she can raise from chair twice without using arm.   She can squat and rise  Sensory: Sensory testing is intact to pinprick, soft touch and vibration sensation in arms but she notes decreased touch and vibration sensation in the left leg compared to the right leg  Coordination: Cerebellar testing reveals good finger-nose-finger.  Gait and station: Station is normal.   Gait is arthritic but stride is only slightly reduced. Tandem gait is wide. Romberg is negative.   Reflexes: Deep tendon reflexes are symmetric and she has spread at her knees and normal DTRs elsewhere   Plantar responses are flexor.    DIAGNOSTIC DATA (LABS, IMAGING, TESTING) - I reviewed patient records, labs,  notes, testing and imaging myself  where available.  Lab Results  Component Value Date   WBC 5.1 02/03/2015   HGB 12.3 02/03/2015   HCT 38.2 02/03/2015   MCV 88.9 02/03/2015   PLT 159 02/03/2015      Component Value Date/Time   NA 143 05/22/2015 1033   K 4.1 05/22/2015 1033   CL 104 05/22/2015 1033   CO2 25 05/22/2015 1033   GLUCOSE 81 05/22/2015 1033   GLUCOSE 88 02/03/2015 1054   BUN 19 05/22/2015 1033   CREATININE 0.88 05/22/2015 1033   CALCIUM 9.2 05/22/2015 1033   PROT 7.0 11/25/2014 1116   ALBUMIN 3.8 11/25/2014 1116   AST 18 11/25/2014 1116   ALT 11 11/25/2014 1116   ALKPHOS 56 11/25/2014 1116   BILITOT 0.3 11/25/2014 1116   GFRNONAA 70 05/22/2015 1033   GFRAA 80 05/22/2015 1033       ASSESSMENT AND PLAN  Disturbed cognition  Gait disturbance  Urinary incontinence, unspecified type  Abnormal brain MRI   1.     She continues to have some fluctuations in strength and gait and severe urinary dysfunction. The MRI of the brain is concerning for normal pressure hydrocephalus though fluctuations would be unusual. She prefers not to do a lumbar puncture for high volume spinal fluid drainage. She might consider if her gait gets much worse.  2.   Continiue Ditropan for bladder.   She is advised to follow-up with urology. 3.    Return to clinic prn if there are new or worsening neurologic symptoms.   Richard A. Epimenio FootSater, MD, PhD 05/25/2016, 12:58 PM Certified in Neurology, Clinical Neurophysiology, Sleep Medicine, Pain Medicine and Neuroimaging  [facility] [address], [address] [place], [place] [address] [phone]

## 2017-02-10 ENCOUNTER — Other Ambulatory Visit: Payer: Self-pay | Admitting: Family Medicine

## 2017-02-10 DIAGNOSIS — Z13820 Encounter for screening for osteoporosis: Secondary | ICD-10-CM

## 2017-02-16 IMAGING — US US EXTREM LOW VENOUS BILAT
1 series · 13 of 24 positions shown · non-contrast
Comparison: None.

CLINICAL DATA: Bilateral leg swelling, left greater than right, for
3 days



[Series 1: us extrem low venous bilat · 0.07mm/px · 13 of 60 slices shown]
[im 1/60]
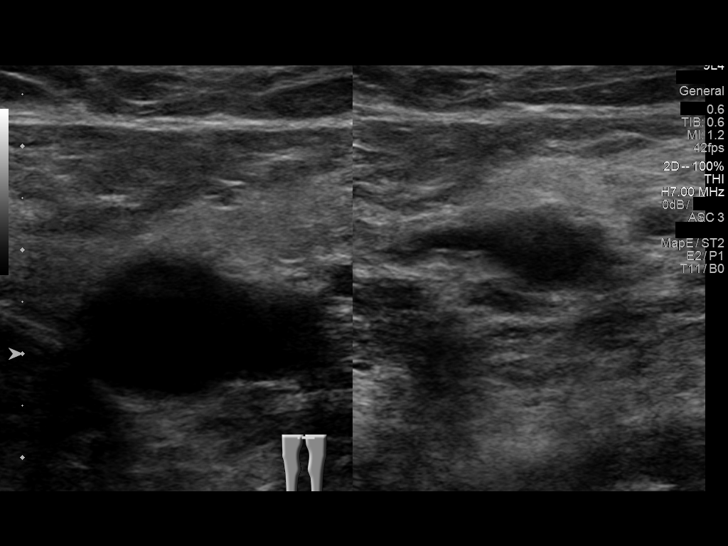
[im 6/60]
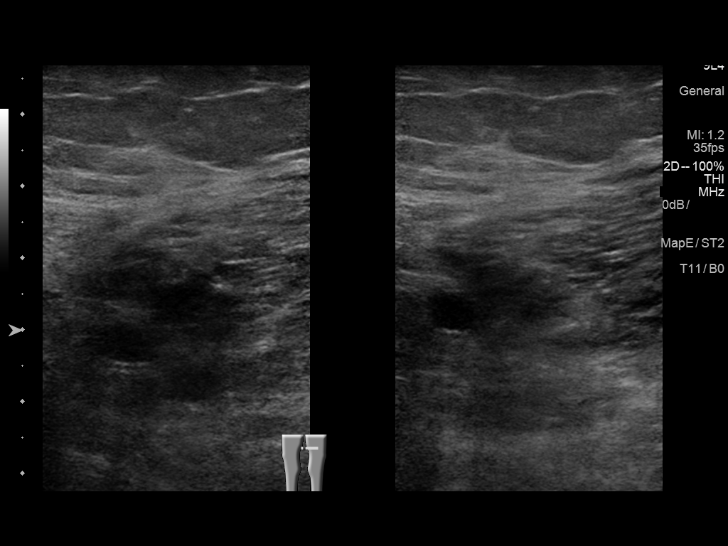
[im 11/60]
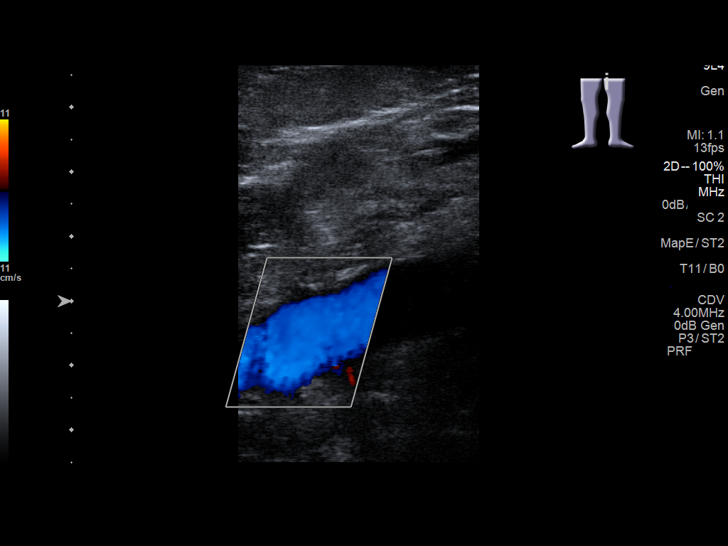
[im 16/60]
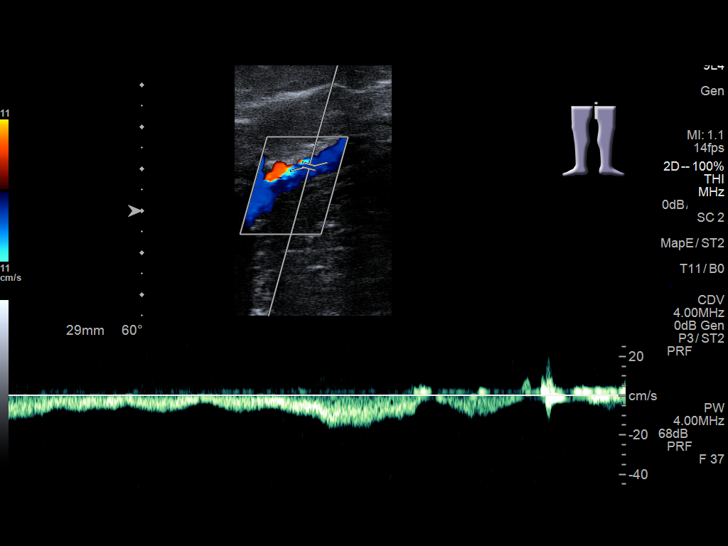
[im 21/60]
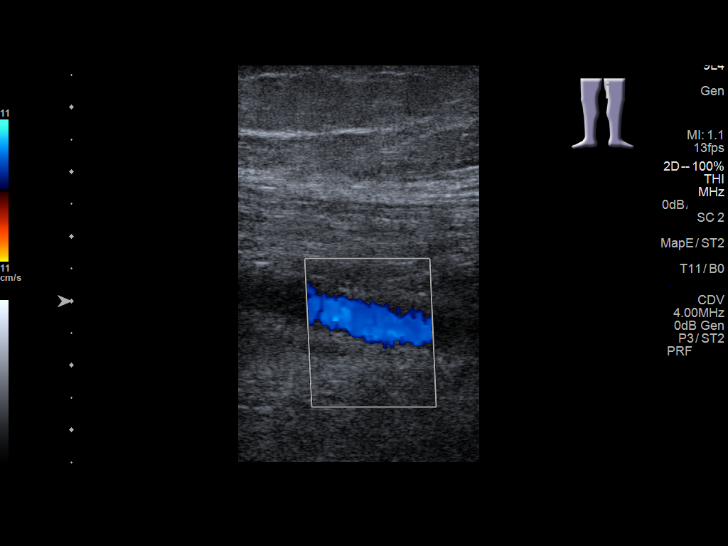
[im 26/60]
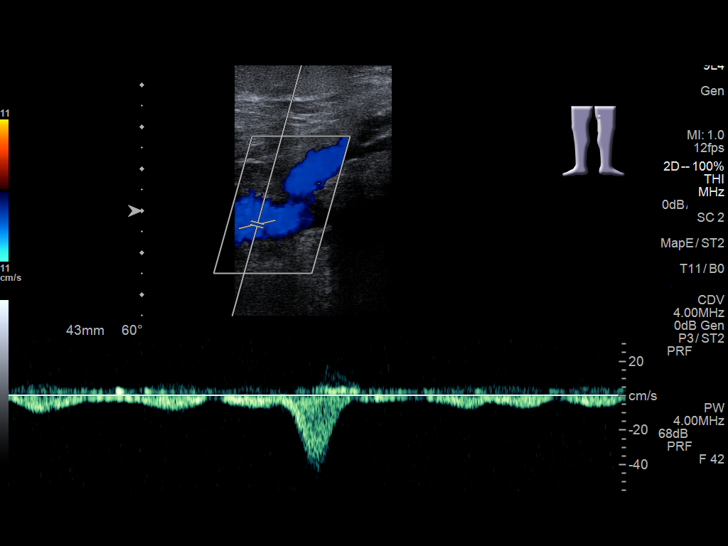
[im 31/60]
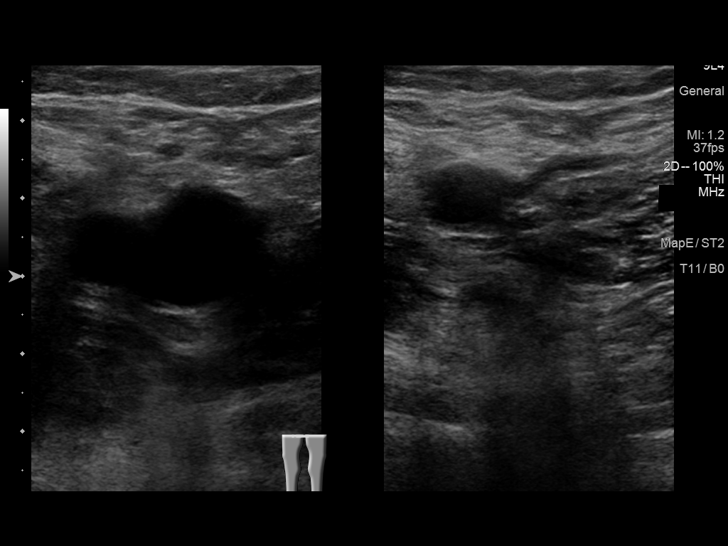
[im 34/60]
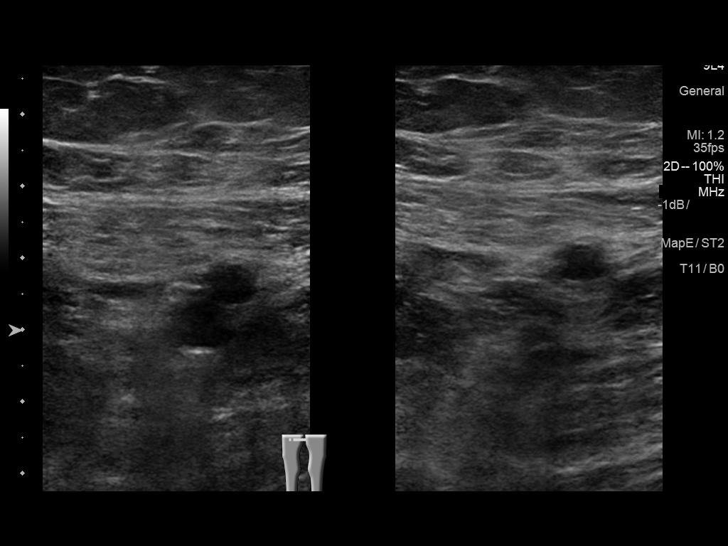
[im 39/60]
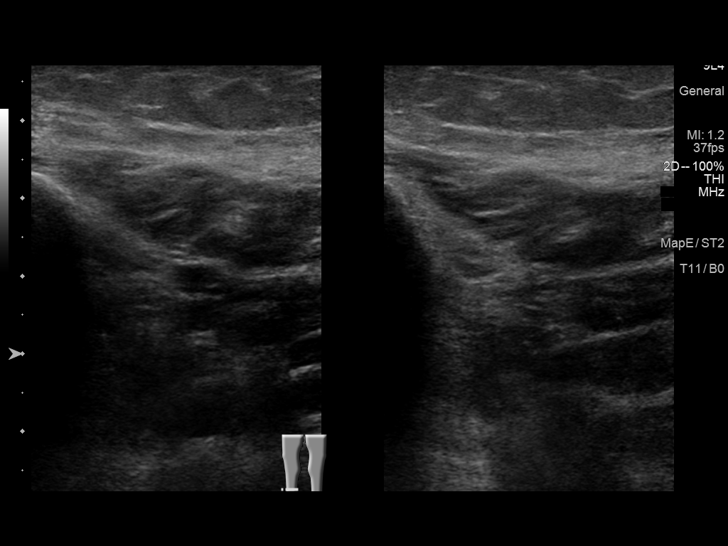
[im 44/60]
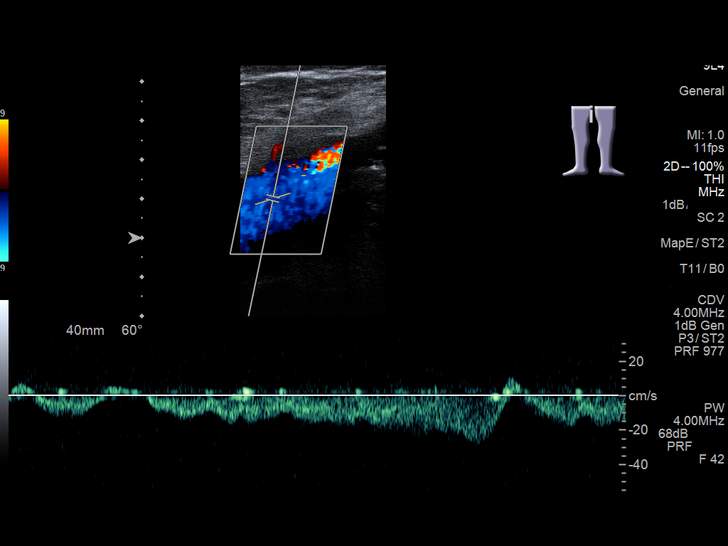
[im 49/60]
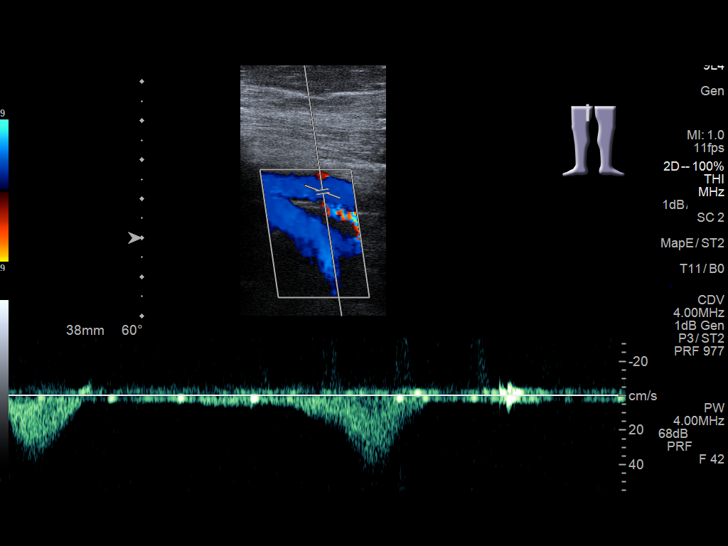
[im 54/60]
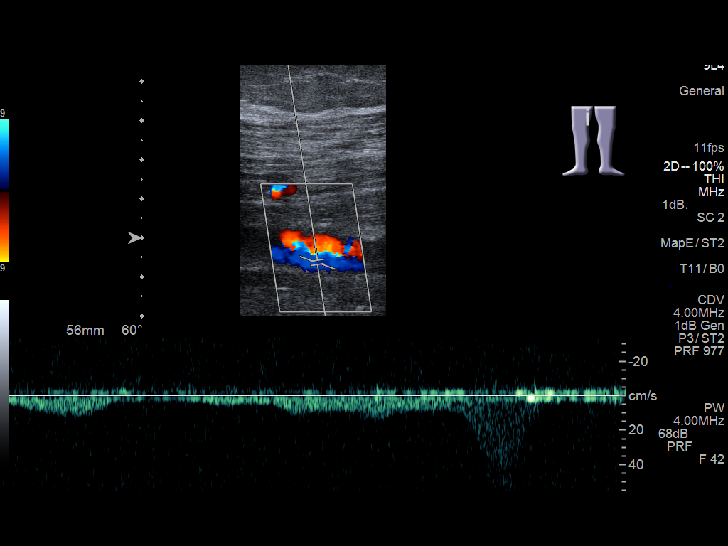
[im 60/60]
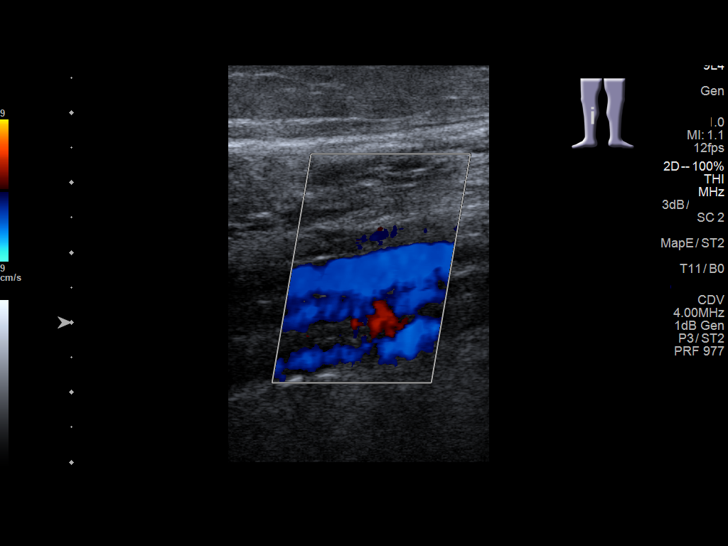

[13 of 24 positions shown; findings below may reference images not displayed]

FINDINGS: RIGHT LOWER EXTREMITY

Common Femoral Vein: No evidence of thrombus. Normal
compressibility, respiratory phasicity and response to augmentation.

Saphenofemoral Junction: No evidence of thrombus.

Profunda Femoral Vein: No evidence of thrombus.

Femoral Vein: No evidence of thrombus.

Popliteal Vein: No evidence of thrombus.

Calf Veins: No evidence of thrombus.

LEFT LOWER EXTREMITY

Common Femoral Vein: No evidence of thrombus. Normal
compressibility, respiratory phasicity and response to augmentation.

Saphenofemoral Junction: No evidence of thrombus.

Profunda Femoral Vein: No evidence of thrombus.

Femoral Vein: No evidence of thrombus.

Popliteal Vein: No evidence of thrombus.

Calf Veins: No evidence of thrombus.
IMPRESSION: No evidence of deep venous thrombosis in either lower extremity.

## 2017-03-10 ENCOUNTER — Ambulatory Visit
Admission: RE | Admit: 2017-03-10 | Discharge: 2017-03-10 | Disposition: A | Discharge status: Home / Self Care | Payer: Medicare Other | Source: Ambulatory Visit | Attending: Family Medicine | Admitting: Family Medicine

## 2017-03-10 DIAGNOSIS — Z13820 Encounter for screening for osteoporosis: Secondary | ICD-10-CM | POA: Diagnosis present

## 2017-03-10 DIAGNOSIS — Z780 Asymptomatic menopausal state: Secondary | ICD-10-CM | POA: Diagnosis not present

## 2017-07-13 ENCOUNTER — Other Ambulatory Visit: Payer: Self-pay

## 2017-07-13 ENCOUNTER — Encounter
Admission: RE | Admit: 2017-07-13 | Discharge: 2017-07-13 | Disposition: A | Discharge status: Home / Self Care | Payer: Medicare Other | Source: Ambulatory Visit | Attending: Surgery | Admitting: Surgery

## 2017-07-13 ENCOUNTER — Ambulatory Visit
Admission: RE | Admit: 2017-07-13 | Discharge: 2017-07-13 | Disposition: A | Discharge status: Home / Self Care | Payer: Medicare Other | Source: Ambulatory Visit | Attending: Surgery | Admitting: Surgery

## 2017-07-13 DIAGNOSIS — I10 Essential (primary) hypertension: Secondary | ICD-10-CM | POA: Diagnosis not present

## 2017-07-13 DIAGNOSIS — J986 Disorders of diaphragm: Secondary | ICD-10-CM | POA: Diagnosis not present

## 2017-07-13 DIAGNOSIS — M1712 Unilateral primary osteoarthritis, left knee: Secondary | ICD-10-CM | POA: Diagnosis not present

## 2017-07-13 DIAGNOSIS — E785 Hyperlipidemia, unspecified: Secondary | ICD-10-CM | POA: Diagnosis not present

## 2017-07-13 DIAGNOSIS — Z01818 Encounter for other preprocedural examination: Secondary | ICD-10-CM

## 2017-07-13 DIAGNOSIS — E039 Hypothyroidism, unspecified: Secondary | ICD-10-CM | POA: Insufficient documentation

## 2017-07-13 DIAGNOSIS — K219 Gastro-esophageal reflux disease without esophagitis: Secondary | ICD-10-CM | POA: Diagnosis not present

## 2017-07-13 DIAGNOSIS — G4733 Obstructive sleep apnea (adult) (pediatric): Secondary | ICD-10-CM | POA: Insufficient documentation

## 2017-07-13 DIAGNOSIS — Z79899 Other long term (current) drug therapy: Secondary | ICD-10-CM | POA: Insufficient documentation

## 2017-07-13 DIAGNOSIS — Z7982 Long term (current) use of aspirin: Secondary | ICD-10-CM | POA: Insufficient documentation

## 2017-07-13 DIAGNOSIS — Z87891 Personal history of nicotine dependence: Secondary | ICD-10-CM | POA: Insufficient documentation

## 2017-07-13 HISTORY — DX: Personal history of other diseases of the digestive system: Z87.19

## 2017-07-13 LAB — BASIC METABOLIC PANEL
Anion gap: 10 (ref 5–15)
BUN: 20 mg/dL (ref 8–23)
CALCIUM: 9 mg/dL (ref 8.9–10.3)
CHLORIDE: 108 mmol/L (ref 98–111)
CO2: 24 mmol/L (ref 22–32)
CREATININE: 0.76 mg/dL (ref 0.44–1.00)
GFR calc non Af Amer: >60 mL/min (ref >60)
Glucose, Bld: 80 mg/dL (ref 70–99)
Potassium: 4 mmol/L (ref 3.5–5.1)
SODIUM: 142 mmol/L (ref 135–145)

## 2017-07-13 LAB — TYPE AND SCREEN
ABO/RH(D): A POS
Antibody Screen: NEGATIVE

## 2017-07-13 LAB — URINALYSIS, ROUTINE W REFLEX MICROSCOPIC
Bilirubin Urine: NEGATIVE
GLUCOSE, UA: NEGATIVE mg/dL
HGB URINE DIPSTICK: NEGATIVE
KETONES UR: NEGATIVE mg/dL
LEUKOCYTES UA: NEGATIVE
Nitrite: NEGATIVE
PH: 6 (ref 5.0–8.0)
PROTEIN: NEGATIVE mg/dL
Specific Gravity, Urine: 1.023 (ref 1.005–1.030)

## 2017-07-13 LAB — CBC
HCT: 38.1 % (ref 35.0–47.0)
Hemoglobin: 12.7 g/dL (ref 12.0–16.0)
MCH: 29 pg (ref 26.0–34.0)
MCHC: 33.4 g/dL (ref 32.0–36.0)
MCV: 86.8 fL (ref 80.0–100.0)
Platelets: 167 10*3/uL (ref 150–440)
RBC: 4.39 MIL/uL (ref 3.80–5.20)
RDW: 13.6 % (ref 11.5–14.5)
WBC: 4.3 10*3/uL (ref 3.6–11.0)

## 2017-07-13 LAB — SURGICAL PCR SCREEN
MRSA, PCR: NEGATIVE
STAPHYLOCOCCUS AUREUS: NEGATIVE

## 2017-07-13 LAB — PROTIME-INR
INR: 0.99
PROTHROMBIN TIME: 13 s (ref 11.4–15.2)

## 2017-07-13 NOTE — Patient Instructions (Signed)
  Your procedure is scheduled on: Tuesday July 26, 2017 Report to Same Day Surgery 2nd floor medical mall (Medical Mall Entrance-take elevator on left to 2nd floor.  Check in with surgery information desk.) To find out your arrival time please call [phone] between 1PM - 3PM on Monday July 25, 2017  Remember: Instructions that are not followed completely may result in serious medical risk, up to and including death, or upon the discretion of your surgeon and anesthesiologist your surgery may need to be rescheduled.    _x___ 1. Do not eat food after midnight the night before your procedure. You may drink clear liquids up to 2 hours before you are scheduled to arrive at the hospital for your procedure.  Do not drink clear liquids within 2 hours of your scheduled arrival to the hospital.  Clear liquids include  --Water or Apple juice without pulp  --Clear carbohydrate beverage such as Gatorade  --Black Coffee or Clear Tea (No milk, no creamers, do not add anything to the coffee or tea)   No gum chewing or hard candies.      __x__ 2. No Alcohol for 24 hours before or after surgery.   __x__3. No Smoking or e-cigarettes for 24 prior to surgery.  Do not use any chewable tobacco products for at least 6 hour prior to surgery   ____  4. Bring all medications with you on the day of surgery if instructed.    __x__ 5. Notify your doctor if there is any change in your medical condition     (cold, fever, infections).   __x__6. On the morning of surgery brush your teeth with toothpaste and water.  You may rinse your mouth with mouth wash if you wish.  Do not swallow any toothpaste or mouthwash.   Do not wear jewelry, make-up, hairpins, clips or nail polish.  Do not wear lotions, powders, deodorant, or perfumes.   Do not shave 48 hours prior to surgery.   Do not bring valuables to the hospital.    [facility] not responsible for any belongings or valuables.               Contacts, dentures or  bridgework may not be worn into surgery.  Leave your suitcase in the car. After surgery it may be brought to your room.  For patients admitted to the hospital, discharge time is determined by your treatment team.  Please read over the following fact sheets that you were given:   [facility] Pre[facility] for [facility] and or MRSA Information   _x___ Take anti-hypertensive listed below, cardiac, seizure, asthma, anti-reflux and psychiatric medicines. These include:  1. Amlodipine/Norvasc  2. Esomeprazole/Nexium  3. Sertraline/Zoloft  4. Hydroxychloroquine/Plaquenil  No Lisinopril/Prinivil on day of surgery.   _x___ Use CHG Soap or sage wipes as directed on instruction sheet   _x___ Follow recommendations from Cardiologist, Pulmonologist or PCP regarding stopping Aspirin, Coumadin, Plavix ,Eliquis, Effient, or Pradaxa, and Pletal.  _x___ Stop 7 days before surgery (Tuesday July 19, 2017) Anti-inflammatories such as Sulindac/Clinoril, Advil, Aleve, Ibuprofen, Motrin, Naproxen, Naprosyn, Goodies powders or aspirin products. OK to take Tylenol and Celebrex.   _x___ Stop supplements until after surgery.  But may continue Vitamin D, Vitamin B, and multivitamin.

## 2017-07-26 ENCOUNTER — Inpatient Hospital Stay: Payer: Medicare Other

## 2017-07-26 ENCOUNTER — Encounter
Admission: RE | Disposition: A | Discharge status: Home Health | Payer: Self-pay | Source: Home / Self Care | Attending: Surgery

## 2017-07-26 ENCOUNTER — Inpatient Hospital Stay
Admission: RE | Admit: 2017-07-26 | Discharge: 2017-07-29 | DRG: 470 | Disposition: A | Discharge status: Home Health | Payer: Medicare Other | Attending: Surgery | Admitting: Surgery

## 2017-07-26 ENCOUNTER — Other Ambulatory Visit: Payer: Self-pay

## 2017-07-26 ENCOUNTER — Encounter: Payer: Self-pay | Admitting: *Deleted

## 2017-07-26 DIAGNOSIS — M1712 Unilateral primary osteoarthritis, left knee: Secondary | ICD-10-CM | POA: Diagnosis present

## 2017-07-26 DIAGNOSIS — H9190 Unspecified hearing loss, unspecified ear: Secondary | ICD-10-CM | POA: Diagnosis present

## 2017-07-26 DIAGNOSIS — M79662 Pain in left lower leg: Secondary | ICD-10-CM | POA: Diagnosis not present

## 2017-07-26 DIAGNOSIS — Z7982 Long term (current) use of aspirin: Secondary | ICD-10-CM | POA: Diagnosis not present

## 2017-07-26 DIAGNOSIS — Z90710 Acquired absence of both cervix and uterus: Secondary | ICD-10-CM | POA: Diagnosis not present

## 2017-07-26 DIAGNOSIS — E039 Hypothyroidism, unspecified: Secondary | ICD-10-CM | POA: Diagnosis present

## 2017-07-26 DIAGNOSIS — Z79890 Hormone replacement therapy (postmenopausal): Secondary | ICD-10-CM

## 2017-07-26 DIAGNOSIS — Z880 Allergy status to penicillin: Secondary | ICD-10-CM

## 2017-07-26 DIAGNOSIS — E785 Hyperlipidemia, unspecified: Secondary | ICD-10-CM | POA: Diagnosis present

## 2017-07-26 DIAGNOSIS — G629 Polyneuropathy, unspecified: Secondary | ICD-10-CM | POA: Diagnosis present

## 2017-07-26 DIAGNOSIS — F319 Bipolar disorder, unspecified: Secondary | ICD-10-CM | POA: Diagnosis present

## 2017-07-26 DIAGNOSIS — Z6832 Body mass index (BMI) 32.0-32.9, adult: Secondary | ICD-10-CM

## 2017-07-26 DIAGNOSIS — K219 Gastro-esophageal reflux disease without esophagitis: Secondary | ICD-10-CM | POA: Diagnosis present

## 2017-07-26 DIAGNOSIS — Z8249 Family history of ischemic heart disease and other diseases of the circulatory system: Secondary | ICD-10-CM

## 2017-07-26 DIAGNOSIS — E669 Obesity, unspecified: Secondary | ICD-10-CM | POA: Diagnosis present

## 2017-07-26 DIAGNOSIS — Z882 Allergy status to sulfonamides status: Secondary | ICD-10-CM

## 2017-07-26 DIAGNOSIS — Z972 Presence of dental prosthetic device (complete) (partial): Secondary | ICD-10-CM

## 2017-07-26 DIAGNOSIS — M79605 Pain in left leg: Secondary | ICD-10-CM

## 2017-07-26 DIAGNOSIS — M3500 Sicca syndrome, unspecified: Secondary | ICD-10-CM | POA: Diagnosis present

## 2017-07-26 DIAGNOSIS — I10 Essential (primary) hypertension: Secondary | ICD-10-CM | POA: Diagnosis present

## 2017-07-26 DIAGNOSIS — Z885 Allergy status to narcotic agent status: Secondary | ICD-10-CM

## 2017-07-26 DIAGNOSIS — Z79899 Other long term (current) drug therapy: Secondary | ICD-10-CM | POA: Diagnosis not present

## 2017-07-26 DIAGNOSIS — Z87440 Personal history of urinary (tract) infections: Secondary | ICD-10-CM

## 2017-07-26 DIAGNOSIS — Z96652 Presence of left artificial knee joint: Secondary | ICD-10-CM

## 2017-07-26 HISTORY — PX: TOTAL KNEE ARTHROPLASTY: SHX125

## 2017-07-26 LAB — ABO/RH: ABO/RH(D): A POS

## 2017-07-26 SURGERY — ARTHROPLASTY, KNEE, TOTAL
Anesthesia: Spinal | Site: Knee | Laterality: Left | Wound class: Clean

## 2017-07-26 MED ORDER — DEXAMETHASONE SODIUM PHOSPHATE 10 MG/ML IJ SOLN
INTRAMUSCULAR | Status: DC | PRN
  Administered 2017-07-26: 5 mg via INTRAVENOUS

## 2017-07-26 MED ORDER — PANTOPRAZOLE SODIUM 40 MG PO TBEC
40.0000 mg | DELAYED_RELEASE_TABLET | Freq: Every day | ORAL | Status: DC
  Administered 2017-07-27 – 2017-07-29 (×3): 40 mg via ORAL
  Filled 2017-07-26 (×3): qty 1

## 2017-07-26 MED ORDER — PROPOFOL 10 MG/ML IV BOLUS
INTRAVENOUS | Status: AC
  Filled 2017-07-26: qty 20

## 2017-07-26 MED ORDER — LIDOCAINE HCL (CARDIAC) PF 100 MG/5ML IV SOSY
PREFILLED_SYRINGE | INTRAVENOUS | Status: DC | PRN
  Administered 2017-07-26: 60 mg via INTRAVENOUS

## 2017-07-26 MED ORDER — PROPOFOL 500 MG/50ML IV EMUL
INTRAVENOUS | Status: DC | PRN
  Administered 2017-07-26: 09:00:00 via INTRAVENOUS
  Administered 2017-07-26: 150 ug/kg/min via INTRAVENOUS
  Administered 2017-07-26: 60 ug/kg/min via INTRAVENOUS

## 2017-07-26 MED ORDER — LISINOPRIL 10 MG PO TABS
10.0000 mg | ORAL_TABLET | Freq: Every day | ORAL | Status: DC
  Administered 2017-07-27 – 2017-07-29 (×3): 10 mg via ORAL
  Filled 2017-07-26 (×3): qty 1

## 2017-07-26 MED ORDER — BUPIVACAINE-EPINEPHRINE (PF) 0.5% -1:200000 IJ SOLN
INTRAMUSCULAR | Status: DC | PRN
  Administered 2017-07-26: 30 mL via PERINEURAL

## 2017-07-26 MED ORDER — MIDAZOLAM HCL 5 MG/5ML IJ SOLN
INTRAMUSCULAR | Status: DC | PRN
  Administered 2017-07-26: 2 mg via INTRAVENOUS

## 2017-07-26 MED ORDER — PROPOFOL 10 MG/ML IV BOLUS
INTRAVENOUS | Status: DC | PRN
  Administered 2017-07-26: 20 mg via INTRAVENOUS

## 2017-07-26 MED ORDER — FENTANYL CITRATE (PF) 100 MCG/2ML IJ SOLN: 25.0000 ug | INTRAMUSCULAR | Status: DC | PRN

## 2017-07-26 MED ORDER — PROPOFOL 500 MG/50ML IV EMUL
INTRAVENOUS | Status: AC
  Filled 2017-07-26: qty 50

## 2017-07-26 MED ORDER — KETOROLAC TROMETHAMINE 15 MG/ML IJ SOLN
15.0000 mg | Freq: Four times a day (QID) | INTRAMUSCULAR | Status: AC
  Administered 2017-07-27 (×2): 15 mg via INTRAVENOUS
  Filled 2017-07-26 (×2): qty 1

## 2017-07-26 MED ORDER — METOCLOPRAMIDE HCL 10 MG PO TABS: 5.0000 mg | ORAL_TABLET | Freq: Three times a day (TID) | ORAL | Status: DC | PRN

## 2017-07-26 MED ORDER — CLINDAMYCIN PHOSPHATE 900 MG/50ML IV SOLN
INTRAVENOUS | Status: AC
  Filled 2017-07-26: qty 50

## 2017-07-26 MED ORDER — HYDROMORPHONE HCL 1 MG/ML IJ SOLN
0.5000 mg | INTRAMUSCULAR | Status: DC | PRN
  Administered 2017-07-28: 0.5 mg via INTRAVENOUS
  Filled 2017-07-26: qty 1

## 2017-07-26 MED ORDER — CARBOXYMETHYLCELLUL-GLYCERIN 1-0.25 % OP SOLN: 1.0000 [drp] | Freq: Three times a day (TID) | OPHTHALMIC | Status: DC | PRN

## 2017-07-26 MED ORDER — KETOROLAC TROMETHAMINE 30 MG/ML IJ SOLN
INTRAMUSCULAR | Status: AC
  Filled 2017-07-26: qty 1

## 2017-07-26 MED ORDER — BUPIVACAINE HCL (PF) 0.5 % IJ SOLN
INTRAMUSCULAR | Status: DC | PRN
  Administered 2017-07-26: 3 mL

## 2017-07-26 MED ORDER — DOCUSATE SODIUM 100 MG PO CAPS
100.0000 mg | ORAL_CAPSULE | Freq: Two times a day (BID) | ORAL | Status: DC
  Administered 2017-07-26 – 2017-07-29 (×6): 100 mg via ORAL
  Filled 2017-07-26 (×6): qty 1

## 2017-07-26 MED ORDER — QUETIAPINE FUMARATE 25 MG PO TABS
50.0000 mg | ORAL_TABLET | Freq: Every day | ORAL | Status: DC
  Administered 2017-07-26 – 2017-07-28 (×3): 50 mg via ORAL
  Filled 2017-07-26 (×3): qty 2

## 2017-07-26 MED ORDER — BISACODYL 10 MG RE SUPP
10.0000 mg | Freq: Every day | RECTAL | Status: DC | PRN
  Administered 2017-07-28 – 2017-07-29 (×2): 10 mg via RECTAL
  Filled 2017-07-26 (×2): qty 1

## 2017-07-26 MED ORDER — ONDANSETRON HCL 4 MG PO TABS: 4.0000 mg | ORAL_TABLET | Freq: Four times a day (QID) | ORAL | Status: DC | PRN

## 2017-07-26 MED ORDER — ALUM HYDROXIDE-MAG CARBONATE 95-358 MG/15ML PO SUSP
15.0000 mL | Freq: Three times a day (TID) | ORAL | Status: DC | PRN
Start: 2017-07-26 — End: 2017-07-26
  Filled 2017-07-26: qty 30

## 2017-07-26 MED ORDER — MIDAZOLAM HCL 2 MG/2ML IJ SOLN
INTRAMUSCULAR | Status: AC
  Filled 2017-07-26: qty 2

## 2017-07-26 MED ORDER — BUPIVACAINE LIPOSOME 1.3 % IJ SUSP
INTRAMUSCULAR | Status: AC
  Filled 2017-07-26: qty 20

## 2017-07-26 MED ORDER — KETOROLAC TROMETHAMINE 30 MG/ML IJ SOLN
INTRAMUSCULAR | Status: DC | PRN
  Administered 2017-07-26: 30 mg via INTRAVENOUS

## 2017-07-26 MED ORDER — CYCLOSPORINE 0.05 % OP EMUL
1.0000 [drp] | Freq: Two times a day (BID) | OPHTHALMIC | Status: DC
  Administered 2017-07-26 – 2017-07-29 (×7): 1 [drp] via OPHTHALMIC
  Filled 2017-07-26 (×8): qty 1

## 2017-07-26 MED ORDER — NEOMYCIN-POLYMYXIN B GU 40-200000 IR SOLN
Status: AC
  Filled 2017-07-26: qty 20

## 2017-07-26 MED ORDER — OXYCODONE HCL 5 MG PO TABS
5.0000 mg | ORAL_TABLET | ORAL | Status: DC | PRN
  Administered 2017-07-26: 5 mg via ORAL
  Administered 2017-07-27: 10 mg via ORAL
  Administered 2017-07-27 (×2): 5 mg via ORAL
  Administered 2017-07-28: 10 mg via ORAL
  Administered 2017-07-29: 5 mg via ORAL
  Filled 2017-07-26 (×6): qty 1
  Filled 2017-07-26: qty 2

## 2017-07-26 MED ORDER — ACETAMINOPHEN 10 MG/ML IV SOLN
INTRAVENOUS | Status: AC
  Filled 2017-07-26: qty 100

## 2017-07-26 MED ORDER — BUPIVACAINE-EPINEPHRINE (PF) 0.5% -1:200000 IJ SOLN
INTRAMUSCULAR | Status: AC
  Filled 2017-07-26: qty 30

## 2017-07-26 MED ORDER — PRAVASTATIN SODIUM 20 MG PO TABS
10.0000 mg | ORAL_TABLET | Freq: Every day | ORAL | Status: DC
  Administered 2017-07-26 – 2017-07-28 (×3): 10 mg via ORAL
  Filled 2017-07-26 (×3): qty 1

## 2017-07-26 MED ORDER — AMLODIPINE BESYLATE 10 MG PO TABS
10.0000 mg | ORAL_TABLET | Freq: Every day | ORAL | Status: DC
  Administered 2017-07-27 – 2017-07-29 (×3): 10 mg via ORAL
  Filled 2017-07-26 (×3): qty 1

## 2017-07-26 MED ORDER — ADULT MULTIVITAMIN W/MINERALS CH
1.0000 | ORAL_TABLET | Freq: Every day | ORAL | Status: DC
  Administered 2017-07-27 – 2017-07-29 (×3): 1 via ORAL
  Filled 2017-07-26 (×3): qty 1

## 2017-07-26 MED ORDER — LIDOCAINE HCL (PF) 2 % IJ SOLN
INTRAMUSCULAR | Status: AC
  Filled 2017-07-26: qty 10

## 2017-07-26 MED ORDER — SODIUM CHLORIDE 0.9 % IV SOLN
INTRAVENOUS | Status: DC
  Administered 2017-07-26 – 2017-07-27 (×2): via INTRAVENOUS

## 2017-07-26 MED ORDER — POLYVINYL ALCOHOL 1.4 % OP SOLN
1.0000 [drp] | Freq: Three times a day (TID) | OPHTHALMIC | Status: DC | PRN
  Filled 2017-07-26: qty 15

## 2017-07-26 MED ORDER — ONDANSETRON HCL 4 MG/2ML IJ SOLN
INTRAMUSCULAR | Status: DC | PRN
  Administered 2017-07-26: 4 mg via INTRAVENOUS

## 2017-07-26 MED ORDER — BUPIVACAINE HCL (PF) 0.5 % IJ SOLN
INTRAMUSCULAR | Status: AC
  Filled 2017-07-26: qty 10

## 2017-07-26 MED ORDER — DIPHENHYDRAMINE HCL 12.5 MG/5ML PO ELIX: 12.5000 mg | ORAL_SOLUTION | ORAL | Status: DC | PRN

## 2017-07-26 MED ORDER — ENOXAPARIN SODIUM 40 MG/0.4ML ~~LOC~~ SOLN
40.0000 mg | SUBCUTANEOUS | Status: DC
  Administered 2017-07-27 – 2017-07-29 (×3): 40 mg via SUBCUTANEOUS
  Filled 2017-07-26 (×3): qty 0.4

## 2017-07-26 MED ORDER — MAGNESIUM HYDROXIDE 400 MG/5ML PO SUSP
30.0000 mL | Freq: Every day | ORAL | Status: DC | PRN
  Administered 2017-07-27: 30 mL via ORAL
  Filled 2017-07-26 (×2): qty 30

## 2017-07-26 MED ORDER — NEOMYCIN-POLYMYXIN B GU 40-200000 IR SOLN
Status: DC | PRN
  Administered 2017-07-26: 14 mL

## 2017-07-26 MED ORDER — CLINDAMYCIN PHOSPHATE 900 MG/50ML IV SOLN
900.0000 mg | Freq: Once | INTRAVENOUS | Status: AC
  Administered 2017-07-26: 900 mg via INTRAVENOUS

## 2017-07-26 MED ORDER — ONDANSETRON HCL 4 MG/2ML IJ SOLN
INTRAMUSCULAR | Status: AC
  Filled 2017-07-26: qty 2

## 2017-07-26 MED ORDER — DEXAMETHASONE SODIUM PHOSPHATE 10 MG/ML IJ SOLN
INTRAMUSCULAR | Status: AC
  Filled 2017-07-26: qty 1

## 2017-07-26 MED ORDER — FENTANYL CITRATE (PF) 100 MCG/2ML IJ SOLN
INTRAMUSCULAR | Status: AC
  Filled 2017-07-26: qty 2

## 2017-07-26 MED ORDER — SODIUM CHLORIDE 0.9 % IJ SOLN
INTRAMUSCULAR | Status: AC
  Filled 2017-07-26: qty 50

## 2017-07-26 MED ORDER — DIVALPROEX SODIUM ER 500 MG PO TB24
1000.0000 mg | ORAL_TABLET | Freq: Every evening | ORAL | Status: DC
  Administered 2017-07-26 – 2017-07-28 (×3): 1000 mg via ORAL
  Filled 2017-07-26 (×4): qty 2

## 2017-07-26 MED ORDER — KETOROLAC TROMETHAMINE 30 MG/ML IJ SOLN
30.0000 mg | Freq: Once | INTRAMUSCULAR | Status: DC
  Filled 2017-07-26: qty 1

## 2017-07-26 MED ORDER — ASPIRIN EC 81 MG PO TBEC
81.0000 mg | DELAYED_RELEASE_TABLET | Freq: Every day | ORAL | Status: DC
  Administered 2017-07-27 – 2017-07-29 (×3): 81 mg via ORAL
  Filled 2017-07-26 (×3): qty 1

## 2017-07-26 MED ORDER — TRANEXAMIC ACID 1000 MG/10ML IV SOLN
INTRAVENOUS | Status: AC
  Filled 2017-07-26: qty 10

## 2017-07-26 MED ORDER — ACETAMINOPHEN 325 MG PO TABS: 325.0000 mg | ORAL_TABLET | Freq: Four times a day (QID) | ORAL | Status: DC | PRN

## 2017-07-26 MED ORDER — ALUM & MAG HYDROXIDE-SIMETH 200-200-20 MG/5ML PO SUSP: 30.0000 mL | Freq: Three times a day (TID) | ORAL | Status: DC | PRN

## 2017-07-26 MED ORDER — CLINDAMYCIN PHOSPHATE 900 MG/50ML IV SOLN
900.0000 mg | Freq: Four times a day (QID) | INTRAVENOUS | Status: AC
  Administered 2017-07-26 – 2017-07-27 (×3): 900 mg via INTRAVENOUS
  Filled 2017-07-26 (×3): qty 50

## 2017-07-26 MED ORDER — LACTATED RINGERS IV SOLN
INTRAVENOUS | Status: DC
  Administered 2017-07-26: 07:00:00 via INTRAVENOUS

## 2017-07-26 MED ORDER — TRANEXAMIC ACID 1000 MG/10ML IV SOLN
INTRAVENOUS | Status: DC | PRN
  Administered 2017-07-26: 1000 mg via TOPICAL

## 2017-07-26 MED ORDER — ONDANSETRON HCL 4 MG/2ML IJ SOLN
4.0000 mg | Freq: Four times a day (QID) | INTRAMUSCULAR | Status: DC | PRN
  Administered 2017-07-28: 4 mg via INTRAVENOUS
  Filled 2017-07-26: qty 2

## 2017-07-26 MED ORDER — TRAMADOL HCL 50 MG PO TABS
50.0000 mg | ORAL_TABLET | Freq: Four times a day (QID) | ORAL | Status: DC
  Administered 2017-07-26 – 2017-07-29 (×12): 50 mg via ORAL
  Filled 2017-07-26 (×13): qty 1

## 2017-07-26 MED ORDER — ACETAMINOPHEN 10 MG/ML IV SOLN
INTRAVENOUS | Status: DC | PRN
  Administered 2017-07-26: 1000 mg via INTRAVENOUS

## 2017-07-26 MED ORDER — HYDROXYCHLOROQUINE SULFATE 200 MG PO TABS
200.0000 mg | ORAL_TABLET | Freq: Every day | ORAL | Status: DC
  Administered 2017-07-27 – 2017-07-29 (×3): 200 mg via ORAL
  Filled 2017-07-26 (×3): qty 1

## 2017-07-26 MED ORDER — ACETAMINOPHEN 500 MG PO TABS
1000.0000 mg | ORAL_TABLET | Freq: Four times a day (QID) | ORAL | Status: AC
  Administered 2017-07-26 – 2017-07-27 (×4): 1000 mg via ORAL
  Filled 2017-07-26 (×4): qty 2

## 2017-07-26 MED ORDER — SERTRALINE HCL 50 MG PO TABS
100.0000 mg | ORAL_TABLET | Freq: Every day | ORAL | Status: DC
Start: 2017-07-27 — End: 2017-07-29
  Administered 2017-07-27 – 2017-07-29 (×3): 100 mg via ORAL
  Filled 2017-07-26 (×3): qty 2

## 2017-07-26 MED ORDER — METOCLOPRAMIDE HCL 5 MG/ML IJ SOLN: 5.0000 mg | Freq: Three times a day (TID) | INTRAMUSCULAR | Status: DC | PRN

## 2017-07-26 MED ORDER — BUPIVACAINE LIPOSOME 1.3 % IJ SUSP
INTRAMUSCULAR | Status: DC | PRN
  Administered 2017-07-26: 60 mL

## 2017-07-26 MED ORDER — FLEET ENEMA 7-19 GM/118ML RE ENEM
1.0000 | ENEMA | Freq: Once | RECTAL | Status: AC | PRN
  Administered 2017-07-29: 1 via RECTAL

## 2017-07-26 SURGICAL SUPPLY — 60 items
BANDAGE ELASTIC 6 LF NS (GAUZE/BANDAGES/DRESSINGS) ×3 IMPLANT
BEARING TIBIAL CR 10X71/75 (Joint) ×1 IMPLANT
BLADE SAW SAG 25X90X1.19 (BLADE) ×3 IMPLANT
BLADE SURG SZ20 CARB STEEL (BLADE) ×3 IMPLANT
BRNG TIB 71/75X10 CRCTE (Joint) ×1 IMPLANT
CANISTER SUCT 1200ML W/VALVE (MISCELLANEOUS) ×3 IMPLANT
CANISTER SUCT 3000ML PPV (MISCELLANEOUS) ×3 IMPLANT
CEMENT BONE R 1X40 (Cement) ×3 IMPLANT
CEMENT VACUUM MIXING SYSTEM (MISCELLANEOUS) ×3 IMPLANT
CHLORAPREP W/TINT 26ML (MISCELLANEOUS) ×3 IMPLANT
COOLER POLAR GLACIER W/PUMP (MISCELLANEOUS) ×3 IMPLANT
COVER MAYO STAND STRL (DRAPES) ×3 IMPLANT
CUFF TOURN 24 STER (MISCELLANEOUS) IMPLANT
CUFF TOURN 30 STER DUAL PORT (MISCELLANEOUS) ×3 IMPLANT
DRAPE IMP U-DRAPE 54X76 (DRAPES) ×3 IMPLANT
DRAPE INCISE IOBAN 66X45 STRL (DRAPES) ×3 IMPLANT
DRAPE SHEET LG 3/4 BI-LAMINATE (DRAPES) ×3 IMPLANT
DRSG OPSITE POSTOP 4X10 (GAUZE/BANDAGES/DRESSINGS) ×3 IMPLANT
DRSG OPSITE POSTOP 4X8 (GAUZE/BANDAGES/DRESSINGS) ×3 IMPLANT
ELECT CAUTERY BLADE 6.4 (BLADE) ×3 IMPLANT
ELECT REM PT RETURN 9FT ADLT (ELECTROSURGICAL) ×3
ELECTRODE REM PT RTRN 9FT ADLT (ELECTROSURGICAL) ×1 IMPLANT
FEMORAL CR LEFT 72.5 (Joint) ×3 IMPLANT
GLOVE BIO SURGEON STRL SZ7.5 (GLOVE) ×12 IMPLANT
GLOVE BIO SURGEON STRL SZ8 (GLOVE) ×12 IMPLANT
GLOVE BIOGEL PI IND STRL 8 (GLOVE) ×1 IMPLANT
GLOVE BIOGEL PI INDICATOR 8 (GLOVE) ×2
GLOVE INDICATOR 8.0 STRL GRN (GLOVE) ×3 IMPLANT
GOWN STRL REUS W/ TWL LRG LVL3 (GOWN DISPOSABLE) ×1 IMPLANT
GOWN STRL REUS W/ TWL XL LVL3 (GOWN DISPOSABLE) ×1 IMPLANT
GOWN STRL REUS W/TWL LRG LVL3 (GOWN DISPOSABLE) ×3
GOWN STRL REUS W/TWL XL LVL3 (GOWN DISPOSABLE) ×3
HOLDER FOLEY CATH W/STRAP (MISCELLANEOUS) ×3 IMPLANT
HOOD PEEL AWAY FLYTE STAYCOOL (MISCELLANEOUS) ×9 IMPLANT
IMMBOLIZER KNEE 19 BLUE UNIV (SOFTGOODS) ×3 IMPLANT
KIT TURNOVER KIT A (KITS) ×3 IMPLANT
NDL SAFETY ECLIPSE 18X1.5 (NEEDLE) ×2 IMPLANT
NEEDLE HYPO 18GX1.5 SHARP (NEEDLE) ×6
NEEDLE SPNL 20GX3.5 QUINCKE YW (NEEDLE) ×3 IMPLANT
NS IRRIG 1000ML POUR BTL (IV SOLUTION) ×3 IMPLANT
PACK TOTAL KNEE (MISCELLANEOUS) ×3 IMPLANT
PAD WRAPON POLAR KNEE (MISCELLANEOUS) ×1 IMPLANT
PATELLA STD 34X8.5 (Orthopedic Implant) ×3 IMPLANT
PLATE KNEE TIBIAL 75MM FIXED (Plate) ×3 IMPLANT
PULSAVAC PLUS IRRIG FAN TIP (DISPOSABLE) ×3
SOL .9 NS 3000ML IRR  AL (IV SOLUTION) ×2
SOL .9 NS 3000ML IRR UROMATIC (IV SOLUTION) ×1 IMPLANT
STAPLER SKIN PROX 35W (STAPLE) ×3 IMPLANT
SUCTION FRAZIER HANDLE 10FR (MISCELLANEOUS) ×2
SUCTION TUBE FRAZIER 10FR DISP (MISCELLANEOUS) ×1 IMPLANT
SUT VIC AB 0 CT1 36 (SUTURE) ×9 IMPLANT
SUT VIC AB 2-0 CT1 27 (SUTURE) ×9
SUT VIC AB 2-0 CT1 TAPERPNT 27 (SUTURE) ×3 IMPLANT
SYR 10ML LL (SYRINGE) ×3 IMPLANT
SYR 20CC LL (SYRINGE) ×3 IMPLANT
SYR 30ML LL (SYRINGE) ×9 IMPLANT
TIBIAL BEARING CR 10X71/75 (Joint) ×3 IMPLANT
TIP FAN IRRIG PULSAVAC PLUS (DISPOSABLE) ×1 IMPLANT
TRAY FOLEY MTR SLVR 16FR STAT (SET/KITS/TRAYS/PACK) ×3 IMPLANT
WRAPON POLAR PAD KNEE (MISCELLANEOUS) ×3

## 2017-07-26 NOTE — Anesthesia Preprocedure Evaluation (Signed)
Anesthesia Evaluation  Patient identified by MRN, date of birth, ID band Patient awake    Reviewed: Allergy & Precautions, H&P , NPO status , Patient's Chart, lab work & pertinent test results  History of Anesthesia Complications Negative for: history of anesthetic complications  Airway Mallampati: III  TM Distance: >3 FB Neck ROM: limited    Dental  (+) Chipped, Poor Dentition, Partial Lower   Pulmonary neg shortness of breath, asthma , former smoker,           Cardiovascular Exercise Tolerance: Good hypertension, (-) angina(-) Past MI      Neuro/Psych  Headaches, PSYCHIATRIC DISORDERS Bipolar Disorder  Neuromuscular disease    GI/Hepatic Neg liver ROS, hiatal hernia, GERD  Medicated and Controlled,  Endo/Other  Hypothyroidism   Renal/GU      Musculoskeletal  (+) Arthritis ,   Abdominal   Peds  Hematology negative hematology ROS (+)   Anesthesia Other Findings Past Medical History: No date: Arthritis No date: Asthma No date: Bipolar disorder (HCC) No date: GERD (gastroesophageal reflux disease) No date: Hearing loss 2011: History of hiatal hernia     Comment:  treated with medication, no surgical repair No date: History of Sjogren's disease No date: HLD (hyperlipidemia) No date: Hypertension No date: Hypothyroidism No date: Incomplete bladder emptying No date: Memory loss No date: Obesity No date: Recurrent UTI No date: Vision abnormalities  Past Surgical History: No date: ABDOMINAL HYSTERECTOMY No date: BACK SURGERY No date: CARPAL TUNNEL RELEASE; Right 12/19/2014: EXCISION OF TONGUE LESION; N/A     Comment:  Procedure: EXCISION OF TONGUE LESION;  Surgeon:               Bud Facereighton Vaught, MD;  Location: [facility] ORS;  Service: ENT;              Laterality: N/A; No date: EYE SURGERY     Comment:  cataracts No date: FOOT SURGERY No date: NECK SURGERY 01/16/2015: SHOULDER ARTHROSCOPY WITH OPEN ROTATOR  CUFF REPAIR; Left     Comment:  Procedure: SHOULDER ARTHROSCOPY WITH mini OPEN ROTATOR               CUFF REPAIR,subacromial decompression, abrasion               chondrop;asty with microfracture of glenoid, excision               distal clavicle, biceps tenodesis, extensive debridement;              Surgeon: Christena FlakeJohn J Poggi, MD;  Location: [facility] ORS;  Service:              Orthopedics;  Laterality: Left; No date: VESICOVAGINAL FISTULA CLOSURE W/ TAH  BMI    Body Mass Index:  32.31 kg/m      Reproductive/Obstetrics negative OB ROS                             Anesthesia Physical Anesthesia Plan  ASA: III  Anesthesia Plan: Spinal   Post-op Pain Management:    Induction:   PONV Risk Score and Plan:   Airway Management Planned: Natural Airway and Nasal Cannula  Additional Equipment:   Intra-op Plan:   Post-operative Plan:   Informed Consent: I have reviewed the patients History and Physical, chart, labs and discussed the procedure including the risks, benefits and alternatives for the proposed anesthesia with the patient or authorized representative who has indicated his/her understanding and acceptance.  Dental Advisory Given  Plan Discussed with: Anesthesiologist, CRNA and Surgeon  Anesthesia Plan Comments: (Patient reports no bleeding problems and no anticoagulant use.  Plan for spinal with backup GA  Patient consented for risks of anesthesia including but not limited to:  - adverse reactions to medications - risk of bleeding, infection, nerve damage and headache - risk of failed spinal - damage to teeth, lips or other oral mucosa - sore throat or hoarseness - Damage to heart, brain, lungs or loss of life  Patient voiced understanding.)        Anesthesia Quick Evaluation

## 2017-07-26 NOTE — NC FL2 (Signed)
[place] MEDICAID FL2 LEVEL OF CARE SCREENING TOOL     IDENTIFICATION  Patient Name: JAQUANA GEIGER Birthdate: 12-Jan-1951 Sex: female Admission Date (Current Location): 07/26/2017  [place] and [place] Number:  Chiropodist and Address:  [facility], [address] [place], [place] [address]      Provider Number: [id number]  Attending Physician Name and Address:  Christena Flake, MD  Relative Name and Phone Number:       Current Level of Care: Hospital Recommended Level of Care: Skilled Nursing Facility Prior Approval Number:    Date Approved/Denied:   PASRR Number: ([id number])  Discharge Plan: SNF    Current Diagnoses: Patient Active Problem List   Diagnosis Date Noted  . Status post total knee replacement using cement, left 07/26/2017  . Gait disturbance 05/25/2016  . Abnormal brain MRI 05/25/2016  . Memory loss 08/28/2015  . Incontinence 05/06/2015  . Numbness 11/25/2014  . Insomnia 08/01/2014  . Abdominal pain, left upper quadrant 07/16/2014  . Abnormal finding on ultrasound 07/16/2014  . Fatty liver disease, nonalcoholic 07/16/2014  . Small intestinal bacterial overgrowth 07/16/2014  . Asthma without status asthmaticus 06/20/2014  . HLD (hyperlipidemia) 06/20/2014  . Dermatitis, eczematoid 06/20/2014  . Acid reflux 06/20/2014  . Cephalalgia 06/20/2014  . BP (high blood pressure) 06/20/2014  . Adiposity 06/20/2014  . Arthritis, degenerative 06/20/2014  . Allergic rhinitis, seasonal 06/20/2014  . Proximal limb weakness 06/20/2014  . Proximal leg weakness 06/20/2014  . Disturbed cognition 06/20/2014  . Carpal tunnel syndrome 06/20/2014  . Tinnitus 06/20/2014    Orientation RESPIRATION BLADDER Height & Weight     Self, Time, Situation, Place  Normal Continent Weight: 218 lb 12.8 oz (99.2 kg) Height:  5\' 9"  (175.3 cm)  BEHAVIORAL SYMPTOMS/MOOD NEUROLOGICAL BOWEL NUTRITION STATUS      Continent Diet(Dieet: Clear  Liquid to be Advanced. )  AMBULATORY STATUS COMMUNICATION OF NEEDS Skin   Extensive Assist Verbally Surgical wounds(Incision: Left Knee. )                       Personal Care Assistance Level of Assistance  Bathing, Feeding, Dressing Bathing Assistance: Limited assistance Feeding assistance: Independent Dressing Assistance: Limited assistance     Functional Limitations Info  Sight, Hearing, Speech Sight Info: Adequate Hearing Info: Adequate Speech Info: Adequate    SPECIAL CARE FACTORS FREQUENCY  PT (By licensed PT), OT (By licensed OT)     PT Frequency: (5) OT Frequency: (5)            Contractures      Additional Factors Info  Code Status, Allergies Code Status Info: (Full Code. ) Allergies Info: (Codeine, Penicillin G, Sulfa Antibiotics)           Current Medications (07/26/2017):  This is the current hospital active medication list Current Facility-Administered Medications  Medication Dose Route Frequency Provider Last Rate Last Dose  . 0.9 %  sodium chloride infusion   Intravenous Continuous Poggi, Excell Seltzer, MD 75 mL/hr at 07/26/17 1153    . acetaminophen (TYLENOL) tablet 1,000 mg  1,000 mg Oral Q6H Poggi, Excell Seltzer, MD   1,000 mg at 07/26/17 1246  . [START ON 07/27/2017] acetaminophen (TYLENOL) tablet 325-650 mg  325-650 mg Oral Q6H PRN Poggi, Excell Seltzer, MD      . alum & mag hydroxide-simeth (MAALOX/MYLANTA) 200-200-20 MG/5ML suspension 30 mL  30 mL Oral TID PRN Poggi, Excell Seltzer, MD      . [  START ON 07/27/2017] amLODipine (NORVASC) tablet 10 mg  10 mg Oral Daily Poggi, Excell SeltzerJohn J, MD      . aspirin EC tablet 81 mg  81 mg Oral Daily Poggi, Excell SeltzerJohn J, MD      . bisacodyl (DULCOLAX) suppository 10 mg  10 mg Rectal Daily PRN Poggi, Excell SeltzerJohn J, MD      . clindamycin (CLEOCIN) IVPB 900 mg  900 mg Intravenous Q6H Poggi, Excell SeltzerJohn J, MD 100 mL/hr at 07/26/17 1342 900 mg at 07/26/17 1342  . cycloSPORINE (RESTASIS) 0.05 % ophthalmic emulsion 1 drop  1 drop Both Eyes BID Poggi, Excell SeltzerJohn J, MD   1  drop at 07/26/17 1342  . diphenhydrAMINE (BENADRYL) 12.5 MG/5ML elixir 12.5-25 mg  12.5-25 mg Oral Q4H PRN Poggi, Excell SeltzerJohn J, MD      . divalproex (DEPAKOTE ER) 24 hr tablet 1,000 mg  1,000 mg Oral QPM Poggi, Excell SeltzerJohn J, MD      . docusate sodium (COLACE) capsule 100 mg  100 mg Oral BID Poggi, Excell SeltzerJohn J, MD      . Melene Muller[START ON 07/27/2017] enoxaparin (LOVENOX) injection 40 mg  40 mg Subcutaneous Q24H Poggi, Excell SeltzerJohn J, MD      . HYDROmorphone (DILAUDID) injection 0.5-1 mg  0.5-1 mg Intravenous Q4H PRN Poggi, Excell SeltzerJohn J, MD      . hydroxychloroquine (PLAQUENIL) tablet 200 mg  200 mg Oral Daily Poggi, Excell SeltzerJohn J, MD      . ketorolac (TORADOL) 15 MG/ML injection 15 mg  15 mg Intravenous Q6H Poggi, Excell SeltzerJohn J, MD      . ketorolac (TORADOL) 30 MG/ML injection 30 mg  30 mg Intravenous Once Poggi, Excell SeltzerJohn J, MD      . Melene Muller[START ON 07/27/2017] lisinopril (PRINIVIL,ZESTRIL) tablet 10 mg  10 mg Oral Daily Poggi, Excell SeltzerJohn J, MD      . magnesium hydroxide (MILK OF MAGNESIA) suspension 30 mL  30 mL Oral Daily PRN Poggi, Excell SeltzerJohn J, MD      . metoCLOPramide (REGLAN) tablet 5-10 mg  5-10 mg Oral Q8H PRN Poggi, Excell SeltzerJohn J, MD       Or  . metoCLOPramide (REGLAN) injection 5-10 mg  5-10 mg Intravenous Q8H PRN Poggi, Excell SeltzerJohn J, MD      . multivitamin with minerals tablet 1 tablet  1 tablet Oral Daily Poggi, Excell SeltzerJohn J, MD      . ondansetron (ZOFRAN) tablet 4 mg  4 mg Oral Q6H PRN Poggi, Excell SeltzerJohn J, MD       Or  . ondansetron (ZOFRAN) injection 4 mg  4 mg Intravenous Q6H PRN Poggi, Excell SeltzerJohn J, MD      . oxyCODONE (Oxy IR/ROXICODONE) immediate release tablet 5-10 mg  5-10 mg Oral Q4H PRN Poggi, Excell SeltzerJohn J, MD      . pantoprazole (PROTONIX) EC tablet 40 mg  40 mg Oral Daily Poggi, Excell SeltzerJohn J, MD      . polyvinyl alcohol (LIQUIFILM TEARS) 1.4 % ophthalmic solution 1 drop  1 drop Both Eyes TID PRN Poggi, Excell SeltzerJohn J, MD      . pravastatin (PRAVACHOL) tablet 10 mg  10 mg Oral q1800 Poggi, Excell SeltzerJohn J, MD      . QUEtiapine (SEROQUEL) tablet 50 mg  50 mg Oral QHS Poggi, Excell SeltzerJohn J, MD      . Melene Muller[START ON  07/27/2017] sertraline (ZOLOFT) tablet 100 mg  100 mg Oral Daily Poggi, Excell SeltzerJohn J, MD      . sodium phosphate (FLEET) 7-19 GM/118ML enema 1 enema  1 enema Rectal Once  PRN Poggi, Excell Seltzer, MD      . traMADol Janean Sark) tablet 50 mg  50 mg Oral Q6H Poggi, Excell Seltzer, MD   50 mg at 07/26/17 1246     Discharge Medications: Please see discharge summary for a list of discharge medications.  Relevant Imaging Results:  Relevant Lab Results:   Additional Information (SSN: [id number])  Shaneece Stockburger, Darleen Crocker, LCSW

## 2017-07-26 NOTE — H&P (Signed)
Paper H&P to be scanned into permanent record. H&P reviewed and patient re-examined. No changes. 

## 2017-07-26 NOTE — Anesthesia Procedure Notes (Signed)
Spinal  Patient location during procedure: OR Start time: 07/26/2017 7:33 AM End time: 07/26/2017 7:38 AM Staffing Anesthesiologist: Piscitello, Cleda MccreedyJoseph K, MD Resident/CRNA: Vania ReaLouw, Valiant Dills P, RN Other anesthesia staff: Irving BurtonBachich, Jennifer, CRNA Performed: resident/CRNA  Preanesthetic Checklist Completed: patient identified, site marked, surgical consent, pre-op evaluation, timeout performed, IV checked, risks and benefits discussed and monitors and equipment checked Spinal Block Patient position: sitting Prep: ChloraPrep Patient monitoring: heart rate, continuous pulse ox and blood pressure Approach: midline Location: L3-4 Injection technique: single-shot Needle Needle type: Pencan  Needle gauge: 24 G Needle length: 10 cm Needle insertion depth: 7 cm Assessment Sensory level: T6 Additional Notes Single shot - negative blood on aspiration, positive barbotage

## 2017-07-26 NOTE — Progress Notes (Signed)
ADMISSION NOTE:  Pt admitted to room 140. A&O X4. No complaints of pain. Surgical dressing clean, dry and intact. Polar care, foot pumps and TEDS on. Family at bedside. Bed in lowest position, call bell in reach and bed alarm on.

## 2017-07-26 NOTE — Transfer of Care (Signed)
Immediate Anesthesia Transfer of Care Note  Patient: Jessica Norman  Procedure(s) Performed: TOTAL KNEE ARTHROPLASTY (Left Knee)  Patient Location: PACU  Anesthesia Type:Spinal  Level of Consciousness: sedated  Airway & Oxygen Therapy: Patient connected to face mask oxygen  Post-op Assessment: Post -op Vital signs reviewed and stable  Post vital signs: stable  Last Vitals:  Vitals Value Taken Time  BP 122/64 07/26/2017 10:10 AM  Temp 36.3 C 07/26/2017 10:10 AM  Pulse 62 07/26/2017 10:10 AM  Resp 16 07/26/2017 10:10 AM  SpO2 100 % 07/26/2017 10:10 AM  Vitals shown include unvalidated device data.  Last Pain:  Vitals:   07/26/17 0618  TempSrc: Oral  PainSc: 8          Complications: No apparent anesthesia complications

## 2017-07-26 NOTE — Evaluation (Signed)
Physical Therapy Evaluation Patient Details Name: Jessica Norman MRN: [id number] DOB: 1950/03/13 Today's Date: 07/26/2017   History of Present Illness  admitted for acute hospitalization status post L TKR (07/26/17), WBAT  Clinical Impression  Upon evaluation, patient alert and oriented; follows commands and demonstrates good effort with all mobility tasks.  Good post-op strength (3-/5) and ROM (9-100 degrees) to L knee; good quad control and activation.  Completes SLR with minimal/no lag; no KI required.  Able to complete bed mobility with mod indep; sit/stand, basic transfers and gait (25' x2) with RW, cga/min assist.  Fair stance time/weight acceptance L LE, but no overt buckling, LOB or safety concerns.  Anticipate consistent progression towards all mobility goals. Would benefit from skilled PT to address above deficits and promote optimal return to PLOF; Recommend transition to HHPT upon discharge from acute hospitalization.     Follow Up Recommendations Home health PT    Equipment Recommendations  Rolling walker with 5" wheels;3in1 (PT)    Recommendations for Other Services       Precautions / Restrictions Precautions Precautions: Fall Restrictions Weight Bearing Restrictions: Yes LLE Weight Bearing: Weight bearing as tolerated      Mobility  Bed Mobility Overal bed mobility: Modified Independent                Transfers Overall transfer level: Needs assistance Equipment used: Rolling walker (2 wheeled) Transfers: Sit to/from Stand Sit to Stand: Min assist         General transfer comment: cuing for hand placement; mod use of momentum to initiate lift off.  Maintains L LE anterior to BOS for pain control  Ambulation/Gait Ambulation/Gait assistance: Min assist Gait Distance (Feet): 25 Feet(x2) Assistive device: Rolling walker (2 wheeled)       General Gait Details: step to progressing to partial step through gait pattern; decreased stance time/weight  shift to L LE; slow, guarded cadence; forward trunk flexion with mod WBing bilat UEs.  Limited L LE heel strike/toe off and L knee flexion in swing; limited L TKR in stance.  Stairs            Wheelchair Mobility    Modified Rankin (Stroke Patients Only)       Balance Overall balance assessment: Needs assistance Sitting-balance support: No upper extremity supported;Feet supported Sitting balance-Leahy Scale: Good     Standing balance support: Bilateral upper extremity supported Standing balance-Leahy Scale: Fair                               Pertinent Vitals/Pain Pain Assessment: 0-10 Pain Score: 4  Pain Location: L knee Pain Descriptors / Indicators: Aching;Grimacing;Guarding Pain Intervention(s): Limited activity within patient's tolerance;Monitored during session;Premedicated before session;Repositioned    Home Living Family/patient expects to be discharged to:: Private residence Living Arrangements: Spouse/significant other Available Help at Discharge: Family Type of Home: House Home Access: Stairs to enter Entrance Stairs-Rails: Left Entrance Stairs-Number of Steps: 3 Home Layout: Two level;Able to live on main level with bedroom/bathroom Home Equipment: Gilmer MorCane - single point      Prior Function Level of Independence: Independent with assistive device(s)         Comments: Mod indep with SPC for ADLs, household and community mobilization; does endorse single fall within previous six months.     Hand Dominance        Extremity/Trunk Assessment   Upper Extremity Assessment Upper Extremity Assessment: Overall WFL for tasks assessed  Lower Extremity Assessment Lower Extremity Assessment: (L knee grossly at least 3-/5, limited by pain/soreness; otherwise, grossly 4+/5 throughout.  Sensation fully intact, good motor control)       Communication   Communication: No difficulties  Cognition Arousal/Alertness: Awake/alert Behavior During  Therapy: WFL for tasks assessed/performed Overall Cognitive Status: Within Functional Limits for tasks assessed                                        General Comments      Exercises Total Joint Exercises Goniometric ROM: L knee ROM:  9-100 degrees Other Exercises Other Exercises: Supine L LE therex, 1x10, AROM: ankle pumps, quad sets, SAQs, heel slides, hip abduct/adduct and SLR.  Good quad activation and control; minimal lag with SLR (no KI required) Other Exercises: Toilet transfer, ambulatory with RW, cga/min assist; slow and guarded, but fair/good L knee control. sit/stand from [facility] with RW, cga/min assist (decreased use of momentum required from higher seating surfaces).  Standing balance at sink for hand hygiene, close sup with RW.   Assessment/Plan    PT Assessment Patient needs continued PT services  PT Problem List Decreased strength;Decreased activity tolerance;Decreased balance;Decreased range of motion;Decreased mobility;Decreased knowledge of use of DME;Decreased safety awareness;Decreased knowledge of precautions;Pain       PT Treatment Interventions DME instruction;Gait training;Stair training;Functional mobility training;Therapeutic activities;Therapeutic exercise;Balance training;Patient/family education    PT Goals (Current goals can be found in the Care Plan section)  Acute Rehab PT Goals Patient Stated Goal: to do my best PT Goal Formulation: With patient Time For Goal Achievement: 08/09/17 Potential to Achieve Goals: Good    Frequency BID   Barriers to discharge Decreased caregiver support      Co-evaluation               AM-PAC PT "6 Clicks" Daily Activity  Outcome Measure Difficulty turning over in bed (including adjusting bedclothes, sheets and blankets)?: A Little Difficulty moving from lying on back to sitting on the side of the bed? : A Little Difficulty sitting down on and standing up from a chair with arms (e.g.,  wheelchair, bedside commode, etc,.)?: Unable Help needed moving to and from a bed to chair (including a wheelchair)?: A Little Help needed walking in hospital room?: A Little Help needed climbing 3-5 steps with a railing? : A Lot 6 Click Score: 15    End of Session Equipment Utilized During Treatment: Gait belt Activity Tolerance: Patient tolerated treatment well Patient left: in chair;with call bell/phone within reach;with family/visitor present Nurse Communication: Mobility status PT Visit Diagnosis: Difficulty in walking, not elsewhere classified (R26.2);Muscle weakness (generalized) (M62.81);Pain Pain - Right/Left: Left Pain - part of body: Knee    Time: 0981-1914 PT Time Calculation (min) (ACUTE ONLY): 45 min   Charges:   PT Evaluation $PT Eval Moderate Complexity: 1 Mod PT Treatments $Gait Training: 8-22 mins $Therapeutic Activity: 8-22 mins   PT G Codes:        Kamalani Mastro H. Manson Passey, PT, DPT, NCS 07/26/17, 3:23 PM [phone]

## 2017-07-26 NOTE — Anesthesia Post-op Follow-up Note (Signed)
Anesthesia QCDR form completed.        

## 2017-07-26 NOTE — Op Note (Signed)
07/26/2017  10:08 AM  Patient:   Jessica Norman  Pre-Op Diagnosis:   Degenerative joint disease, left knee.  Post-Op Diagnosis:   Same  Procedure:   Left TKA using all-cemented [employer] Vanguard system with a 72.5 mm PCR femur, a 75 mm tibial tray with a 10 mm E-poly insert, and a 34 x 8.5 mm all-poly 3-pegged domed patella.  Surgeon:   Maryagnes AmosJ. Jeffrey Kadon Andrus, MD  Assistant:   Horris LatinoLance McGhee, PA-C   Anesthesia:   Spinal  Findings:   As above  Complications:   None  EBL:   25 cc  Fluids:   1200 cc crystalloid  UOP:   300 cc  TT:   95 minutes at 300 mmHg  Drains:   None  Closure:   Staples  Implants:   As above  Brief Clinical Note:   The patient is a 67 year old female with a long history of progressively worsening left knee pain. The patient's symptoms have progressed despite medications, activity modification, injections, etc. The patient's history and examination were consistent with advanced degenerative joint disease of the right knee confirmed by plain radiographs. The patient presents at this time for a left total knee arthroplasty.  Procedure:   The patient was brought into the operating room. After adequate spinal anesthesia was obtained, the patient was lain in the supine position. A Foley catheter was placed by the nurse before the right lower extremity was prepped with ChloraPrep solution and draped sterilely. Preoperative antibiotics were administered. After verifying the proper laterality with a surgical timeout, the limb was exsanguinated with an Esmarch and the tourniquet inflated to 300 mmHg. A standard anterior approach to the knee was made through an approximately 7 inch incision. The incision was carried down through the subcutaneous tissues to expose superficial retinaculum. This was split the length of the incision and the medial flap elevated sufficiently to expose the medial retinaculum. The medial retinaculum was incised, leaving a 3-4 mm cuff of tissue on the  patella. This was extended distally along the medial border of the patellar tendon and proximally through the medial third of the quadriceps tendon. A subtotal fat pad excision was performed before the soft tissues were elevated off the anteromedial and anterolateral aspects of the proximal tibia to the level of the collateral ligaments. The anterior portions of the medial and lateral menisci were removed, as was the anterior cruciate ligament. With the knee flexed to 90, the external tibial guide was positioned and the appropriate proximal tibial cut made. This piece was taken to the back table where it was measured and found to be optimally replicated by a 75 mm component.  Attention was directed to the distal femur. The intramedullary canal was accessed through a 3/8" drill hole. The intramedullary guide was inserted and position in order to obtain a neutral flexion gap. The intercondylar block was positioned with care taken to avoid notching the anterior cortex of the femur. The appropriate cut was made. Next, the distal cutting block was placed at 6 of valgus alignment. Using the 9 mm slot, the distal cut was made. The distal femur was measured and found to be optimally replicated by the 72.5 mm component. The 72.5 mm 4-in-1 cutting block was positioned and first the posterior, then the posterior chamfer, the anterior chamfer, femoral and intercondylar cuts were made. At this point, the posterior portions medial and lateral menisci were removed. A trial reduction was performed using the appropriate femoral and tibial components with the 10 mm  insert. This demonstrated excellent stability to varus and valgus stressing both in flexion and extension while permitting full extension. Patella tracking was assessed and found to be excellent. Therefore, the tibial guide position was marked on the proximal tibia. The patella thickness was measured and found to be 20 mm. Therefore, the appropriate cut was made. The  patellar surface was measured and found to be optimally replicated by the 34 mm component. The three peg holes were drilled in place before the trial button was inserted. Patella tracking was assessed and found to be excellent, passing the "no thumb test". The lug holes were drilled into the distal femur before the trial component was removed, leaving only the tibial tray. The keel was then created using the appropriate tower, reamer, and punch.  The bony surfaces were prepared for cementing by irrigating thoroughly with bacitracin saline solution. A bone plug was fashioned from some of the bone that had been removed previously and used to plug the distal femoral canal. In addition, 20 cc of Exparel diluted out to 60 cc with normal saline and 30 cc of 0.5% Sensorcaine were injected into the postero-medial and postero-lateral aspects of the knee, the medial and lateral gutter regions, and the peri-incisional tissues to help with postoperative analgesia. Meanwhile, the cement was being mixed on the back table. When it was ready, the tibial tray was cemented in first. The excess cement was removed using Personal assistant. Next, the femoral component was impacted into place. Again, the excess cement was removed using Personal assistant. The 10 mm trial insert was positioned and the knee brought into extension while the cement hardened. Finally, the patella was cemented into place and secured using the patellar clamp. Again, the excess cement was removed using Personal assistant. Once the cement had hardened, the knee was placed through a range of motion with the findings as described above. Therefore, the trial insert was removed and, after verifying that no cement had been retained posteriorly, the permanent insert was positioned and secured using the appropriate key locking mechanism. Again the knee was placed through a range of motion with the findings as described above.  The wound was copiously irrigated with  bacitracin saline solution using the jet lavage system before the quadriceps tendon and retinacular layer were reapproximated using #0 Vicryl interrupted sutures. The superficial retinacular layer also was closed using a running #0 Vicryl suture. A total of 10 cc of transexemic acid (TXA) was injected intra-articularly before the subcutaneous tissues were closed in several layers using 2-0 Vicryl interrupted sutures. The skin was closed using staples. A sterile honeycomb dressing was applied to the skin before the leg was wrapped with an Ace wrap to accommodate the Polar Care device. The patient was then awakened and returned to the recovery room in satisfactory condition after tolerating the procedure well.

## 2017-07-26 NOTE — Progress Notes (Signed)
Took over care of pt from DixAna. Pt reported pain 4/10 after working with PT. PRN 5mg  oxycodone given. Pt up in chair. No other complaints from pt at this time.

## 2017-07-27 LAB — CBC WITH DIFFERENTIAL/PLATELET
BASOS ABS: 0 10*3/uL (ref 0–0.1)
Basophils Relative: 0 %
Eosinophils Absolute: 0 10*3/uL (ref 0–0.7)
Eosinophils Relative: 0 %
HEMATOCRIT: 30.6 % — AB (ref 35.0–47.0)
Hemoglobin: 10.1 g/dL — ABNORMAL LOW (ref 12.0–16.0)
LYMPHS PCT: 11 %
Lymphs Abs: 0.8 10*3/uL — ABNORMAL LOW (ref 1.0–3.6)
MCH: 28.8 pg (ref 26.0–34.0)
MCHC: 33.1 g/dL (ref 32.0–36.0)
MCV: 87.1 fL (ref 80.0–100.0)
MONO ABS: 0.8 10*3/uL (ref 0.2–0.9)
MONOS PCT: 11 %
NEUTROS ABS: 5.6 10*3/uL (ref 1.4–6.5)
Neutrophils Relative %: 78 %
Platelets: 129 10*3/uL — ABNORMAL LOW (ref 150–440)
RBC: 3.52 MIL/uL — ABNORMAL LOW (ref 3.80–5.20)
RDW: 14.1 % (ref 11.5–14.5)
WBC: 7.3 10*3/uL (ref 3.6–11.0)

## 2017-07-27 LAB — BASIC METABOLIC PANEL
Anion gap: 6 (ref 5–15)
BUN: 17 mg/dL (ref 8–23)
CHLORIDE: 108 mmol/L (ref 98–111)
CO2: 27 mmol/L (ref 22–32)
Calcium: 8.4 mg/dL — ABNORMAL LOW (ref 8.9–10.3)
Creatinine, Ser: 0.92 mg/dL (ref 0.44–1.00)
GFR calc Af Amer: >60 mL/min (ref >60)
GLUCOSE: 115 mg/dL — AB (ref 70–99)
POTASSIUM: 4 mmol/L (ref 3.5–5.1)
Sodium: 141 mmol/L (ref 135–145)

## 2017-07-27 NOTE — Progress Notes (Signed)
Pt reported pain in back of her leg from her thigh down to her calf. She says her calf is especially tender; no swelling noted to calf, but does have some swelling to thigh. VSS, pt denies chest pain or SOB. Dr. Allena KatzPatel notified of pain, stated to let him know if respiratory status or HR changes overnight. Also, that if pt continues to have pain overnight, doppler will be done. Pt and family updated  Lowella DellHudson, Roger Fasnacht G

## 2017-07-27 NOTE — Progress Notes (Signed)
Physical Therapy Treatment Patient Details Name: Jessica Norman MRN: [id number] DOB: 1950-08-27 Today's Date: 07/27/2017    History of Present Illness Pt is a 67 y/o F admitted for acute hospitalization status post L TKR (07/26/17), WBAT    PT Comments    Mrs. Veno made excellent progress with mobility, ambulating 200 ft with supervision using a RW.  Pt demonstrates proper technique and safety with sit<>stand tranfers without cues or physical assist, but supervision for safety. Follow up recommendations remain appropriate.    Follow Up Recommendations  Home health PT     Equipment Recommendations  Rolling walker with 5" wheels;3in1 (PT)    Recommendations for Other Services       Precautions / Restrictions Precautions Precautions: Fall;Knee Precaution Booklet Issued: No Precaution Comments: Reviewed no pillow under knee Restrictions Weight Bearing Restrictions: Yes LLE Weight Bearing: Weight bearing as tolerated    Mobility  Bed Mobility Overal bed mobility: Modified Independent             General bed mobility comments: Increased effort but no physical assist or cues needed  Transfers Overall transfer level: Needs assistance Equipment used: Rolling walker (2 wheeled) Transfers: Sit to/from Stand Sit to Stand: Supervision;From elevated surface         General transfer comment: Slightly elevated bed per pt's request.  Pt performs sit<>stand with proper and safe technique without requiring cues.   Ambulation/Gait Ambulation/Gait assistance: Supervision Gait Distance (Feet): 200 Feet Assistive device: Rolling walker (2 wheeled) Gait Pattern/deviations: Step-to pattern;Step-through pattern;Decreased stance time - left;Decreased weight shift to left;Decreased step length - right;Antalgic Gait velocity: decreased   General Gait Details: Pt initially with step to pattern but progresses to step through pattern without cues.  Dec L knee flexion, hip E and DF.      Stairs             Wheelchair Mobility    Modified Rankin (Stroke Patients Only)       Balance Overall balance assessment: Needs assistance Sitting-balance support: No upper extremity supported;Feet supported Sitting balance-Leahy Scale: Good     Standing balance support: Bilateral upper extremity supported;During functional activity Standing balance-Leahy Scale: Poor Standing balance comment: Relies on UE support for static and dynamic activities                            Cognition Arousal/Alertness: Awake/alert Behavior During Therapy: WFL for tasks assessed/performed Overall Cognitive Status: Within Functional Limits for tasks assessed                                        Exercises Total Joint Exercises Ankle Circles/Pumps: AROM;Both;10 reps;Supine Quad Sets: Strengthening;Both;10 reps;Supine Straight Leg Raises: Strengthening;Left;10 reps;Supine Knee Flexion: Left;5 reps;Seated;Other (comment);AAROM(with 5 second holds) Goniometric ROM: -3 to 108 deg    General Comments        Pertinent Vitals/Pain Pain Assessment: 0-10 Pain Score: 1  Pain Location: L knee Pain Descriptors / Indicators: Discomfort Pain Intervention(s): Limited activity within patient's tolerance;Monitored during session;Repositioned;Premedicated before session;Ice applied    Home Living                      Prior Function            PT Goals (current goals can now be found in the care plan section) Acute Rehab PT Goals Patient  Stated Goal: to get stronger PT Goal Formulation: With patient Time For Goal Achievement: 08/09/17 Potential to Achieve Goals: Good Progress towards PT goals: Progressing toward goals    Frequency    BID      PT Plan Current plan remains appropriate    Co-evaluation              AM-PAC PT "6 Clicks" Daily Activity  Outcome Measure  Difficulty turning over in bed (including adjusting  bedclothes, sheets and blankets)?: A Little Difficulty moving from lying on back to sitting on the side of the bed? : A Little Difficulty sitting down on and standing up from a chair with arms (e.g., wheelchair, bedside commode, etc,.)?: A Little Help needed moving to and from a bed to chair (including a wheelchair)?: A Little Help needed walking in hospital room?: A Little Help needed climbing 3-5 steps with a railing? : A Little 6 Click Score: 18    End of Session Equipment Utilized During Treatment: Gait belt Activity Tolerance: Patient tolerated treatment well Patient left: in chair;with call bell/phone within reach;with chair alarm set;with SCD's reapplied;Other (comment)(with polar care and bone foam in place) Nurse Communication: Mobility status PT Visit Diagnosis: Difficulty in walking, not elsewhere classified (R26.2);Muscle weakness (generalized) (M62.81);Pain Pain - Right/Left: Left Pain - part of body: Knee     Time: [id number] PT Time Calculation (min) (ACUTE ONLY): 24 min  Charges:  $Gait Training: 8-22 mins $Therapeutic Exercise: 8-22 mins                    G Codes:       Encarnacion ChuAshley Kemesha Mosey PT, DPT 07/27/2017, 12:44 PM

## 2017-07-27 NOTE — Progress Notes (Signed)
Physical Therapy Treatment Patient Details Name: Jessica Norman MRN: [id number] DOB: Dec 16, 1950 Today's Date: 07/27/2017    History of Present Illness Pt is a 67 y/o F admitted for acute hospitalization status post L TKR (07/26/17), WBAT    PT Comments    Jessica Norman continues to progress well toward PT goals.  Pt ambulated 230 ft with supervision. Demonstration and verbal cues provided for improved gait mechanics.  Pt continues to demonstrate safe technique with sit<>stand transfers.  Follow up recommendations remain appropriate.    Follow Up Recommendations  Home health PT     Equipment Recommendations  Rolling walker with 5" wheels;3in1 (PT)    Recommendations for Other Services       Precautions / Restrictions Precautions Precautions: Fall;Knee Precaution Booklet Issued: No Precaution Comments: Reviewed no pillow under knee Restrictions Weight Bearing Restrictions: Yes LLE Weight Bearing: Weight bearing as tolerated    Mobility  Bed Mobility Overal bed mobility: Modified Independent             General bed mobility comments: Increased effort but no physical assist or cues needed for sit>supine  Transfers Overall transfer level: Needs assistance Equipment used: Rolling walker (2 wheeled) Transfers: Sit to/from Stand Sit to Stand: Supervision         General transfer comment: Pt performs sit<>stand with proper and safe technique without requiring cues. Supevision provided for safety.   Ambulation/Gait Ambulation/Gait assistance: Supervision Gait Distance (Feet): 230 Feet Assistive device: Rolling walker (2 wheeled) Gait Pattern/deviations: Step-to pattern;Step-through pattern;Decreased stance time - left;Decreased weight shift to left;Decreased step length - right;Antalgic Gait velocity: decreased   General Gait Details: Pt initially with step to pattern but progresses to step through pattern without cues.  Demonstration and verbal cues for improved L knee  flexion as well as emphasized heel strike and toe off.     Stairs             Wheelchair Mobility    Modified Rankin (Stroke Patients Only)       Balance Overall balance assessment: Needs assistance Sitting-balance support: No upper extremity supported;Feet supported Sitting balance-Leahy Scale: Good     Standing balance support: Bilateral upper extremity supported;During functional activity Standing balance-Leahy Scale: Fair Standing balance comment: Pt able to stand statically without UE support but relies on UE support for dynamic activities including ambulation.                              Cognition Arousal/Alertness: Awake/alert Behavior During Therapy: WFL for tasks assessed/performed Overall Cognitive Status: Within Functional Limits for tasks assessed                                        Exercises Total Joint Exercises Ankle Circles/Pumps: AROM;Both;10 reps;Supine Quad Sets: Strengthening;Both;10 reps;Supine Straight Leg Raises: Strengthening;Left;10 reps;Supine Long Arc Quad: Strengthening;Left;10 reps;Seated Knee Flexion: Left;5 reps;Seated;Other (comment);AAROM(with 5 second holds) Goniometric ROM: -3 to 108 deg    General Comments        Pertinent Vitals/Pain Pain Assessment: 0-10 Pain Score: 2  Pain Location: L knee Pain Descriptors / Indicators: Discomfort Pain Intervention(s): Limited activity within patient's tolerance;Monitored during session;Repositioned;Premedicated before session;Ice applied    Home Living                      Prior Function  PT Goals (current goals can now be found in the care plan section) Acute Rehab PT Goals Patient Stated Goal: to get stronger PT Goal Formulation: With patient Time For Goal Achievement: 08/09/17 Potential to Achieve Goals: Good Progress towards PT goals: Progressing toward goals    Frequency    BID      PT Plan Current plan remains  appropriate    Co-evaluation              AM-PAC PT "6 Clicks" Daily Activity  Outcome Measure  Difficulty turning over in bed (including adjusting bedclothes, sheets and blankets)?: A Little Difficulty moving from lying on back to sitting on the side of the bed? : A Little Difficulty sitting down on and standing up from a chair with arms (e.g., wheelchair, bedside commode, etc,.)?: A Little Help needed moving to and from a bed to chair (including a wheelchair)?: A Little Help needed walking in hospital room?: A Little Help needed climbing 3-5 steps with a railing? : A Little 6 Click Score: 18    End of Session Equipment Utilized During Treatment: Gait belt Activity Tolerance: Patient tolerated treatment well Patient left: with SCD's reapplied;Other (comment);in bed;with bed alarm set;with call bell/phone within reach(with polar care and towel roll in place) Nurse Communication: Mobility status PT Visit Diagnosis: Difficulty in walking, not elsewhere classified (R26.2);Muscle weakness (generalized) (M62.81);Pain Pain - Right/Left: Left Pain - part of body: Knee     Time: 1315-1340 PT Time Calculation (min) (ACUTE ONLY): 25 min  Charges:  $Gait Training: 8-22 mins $Therapeutic Exercise: 8-22 mins                    G Codes:       Encarnacion Chu PT, DPT 07/27/2017, 4:00 PM

## 2017-07-27 NOTE — Anesthesia Postprocedure Evaluation (Signed)
Anesthesia Post Note  Patient: Jessica Norman  Procedure(s) Performed: TOTAL KNEE ARTHROPLASTY (Left Knee)  Patient location during evaluation: Other Anesthesia Type: Spinal Level of consciousness: awake and alert Pain management: pain level controlled Vital Signs Assessment: post-procedure vital signs reviewed and stable Respiratory status: spontaneous breathing and respiratory function stable Cardiovascular status: blood pressure returned to baseline and stable Postop Assessment: spinal receding Anesthetic complications: no     Last Vitals:  Vitals:   07/27/17 0441 07/27/17 0802  BP: 137/62 128/73  Pulse: 75 61  Resp: 16 18  Temp: 36.6 C 36.7 C  SpO2: 94% 96%    Last Pain:  Vitals:   07/27/17 0802  TempSrc: Oral  PainSc:                  Jessica Norman,  Jessica Norman

## 2017-07-27 NOTE — Progress Notes (Signed)
  Subjective: 1 Day Post-Op Procedure(s) (LRB): TOTAL KNEE ARTHROPLASTY (Left) Patient reports pain as mild.   Patient is well, and has had no acute complaints or problems Plan is to go Home after hospital stay.  PT and Care management to assist with discharge planning. Negative for chest pain and shortness of breath Fever: no Gastrointestinal:Negative for nausea and vomiting Pt states that she is passing gas without pain.  Objective: Vital signs in last 24 hours: Temp:  [97.4 F (36.3 C)-98.2 F (36.8 C)] 97.8 F (36.6 C) (07/10 0441) Pulse Rate:  [57-75] 75 (07/10 0441) Resp:  [12-20] 16 (07/10 0441) BP: (117-151)/(62-94) 137/62 (07/10 0441) SpO2:  [94 %-100 %] 94 % (07/10 0441)  Intake/Output from previous day:  Intake/Output Summary (Last 24 hours) at 07/27/2017 0746 Last data filed at 07/26/2017 1834 Gross per 24 hour  Intake 2075.83 ml  Output 1075 ml  Net 1000.83 ml    Intake/Output this shift: No intake/output data recorded.  Labs: Recent Labs    07/27/17 0442  HGB 10.1*   Recent Labs    07/27/17 0442  WBC 7.3  RBC 3.52*  HCT 30.6*  PLT 129*   Recent Labs    07/27/17 0442  NA 141  K 4.0  CL 108  CO2 27  BUN 17  CREATININE 0.92  GLUCOSE 115*  CALCIUM 8.4*   No results for input(s): LABPT, INR in the last 72 hours.   EXAM General - Patient is Alert, Appropriate and Oriented Extremity - ABD soft Neurovascular intact Sensation intact distally Intact pulses distally Dorsiflexion/Plantar flexion intact Incision: moderate drainage No cellulitis present Dressing/Incision - blood tinged drainage along the inferior aspect of the knee incision. Motor Function - intact, moving foot and toes well on exam.  Abdomen soft with normal BS.  Past Medical History:  Diagnosis Date  . Arthritis   . Asthma   . Bipolar disorder (HCC)   . GERD (gastroesophageal reflux disease)   . Hearing loss   . History of hiatal hernia 2011   treated with medication,  no surgical repair  . History of Sjogren's disease   . HLD (hyperlipidemia)   . Hypertension   . Hypothyroidism   . Incomplete bladder emptying   . Memory loss   . Obesity   . Recurrent UTI   . Vision abnormalities     Assessment/Plan: 1 Day Post-Op Procedure(s) (LRB): TOTAL KNEE ARTHROPLASTY (Left) Active Problems:   Status post total knee replacement using cement, left  Estimated body mass index is 32.31 kg/m as calculated from the following:   Height as of this encounter: 5\' 9"  (1.753 m).   Weight as of this encounter: 99.2 kg (218 lb 12.8 oz). Advance diet Up with therapy D/C IV fluids when tolerating po intake.  Labs reviewed this morning.  CBC and BMP ordered for tomorrow morning. Up with therapy today.  Continue to work on having a BM. Current plan is for discharge home with HHPT. Plan will be for possible discharge home tomorrow.  DVT Prophylaxis - Lovenox, Foot Pumps and TED hose Weight-Bearing as tolerated to left leg  J. Horris LatinoLance McGhee, PA-C [facility] 07/27/2017, 7:46 AM

## 2017-07-27 NOTE — Care Management Note (Signed)
Case Management Note  Patient Details  Name: AUBRYANA VITTORIO MRN: [id number] Date of Birth: [birth year]  Subjective/Objective:   POD # 1 left total knee arthroplasty. Met with patient at bedside to discuss discharge planning. She lives with her spouse and daughter. She has a walker. Offered a list of home health agencies. Referral to [facility] for HHPT. [facility]: [facility] [address]. [place]. [phone]. Called Lovenox 40 mg # 14 no refills. PCP is Dr. Lennox Grumbles.                  Action/Plan: [facility] for HHPt, Lovenox called in. No DME  Expected Discharge Date:                  Expected Discharge Plan:  [place]  In-House Referral:     Discharge planning Services  CM Consult  Post Acute Care Choice:  Home Health Choice offered to:  Patient  DME Arranged:    DME Agency:     HH Arranged:  PT [place]:  [facility] [employer]  Status of Service:  In process, will continue to follow  If discussed at Long Length of Stay Meetings, dates discussed:    Additional Comments:  Jolly Mango, RN 07/27/2017, 2:51 PM

## 2017-07-27 NOTE — Progress Notes (Signed)
Clinical Social Worker (CSW) received SNF consult. PT is recommending home health. RN case manager aware of above. Please reconsult if future social work needs arise. CSW signing off.   Freddi Schrager, LCSW [phone] 

## 2017-07-28 ENCOUNTER — Inpatient Hospital Stay: Payer: Medicare Other

## 2017-07-28 ENCOUNTER — Encounter: Payer: Self-pay | Admitting: Surgery

## 2017-07-28 LAB — CBC
HEMATOCRIT: 30.9 % — AB (ref 35.0–47.0)
HEMOGLOBIN: 10.4 g/dL — AB (ref 12.0–16.0)
MCH: 29 pg (ref 26.0–34.0)
MCHC: 33.7 g/dL (ref 32.0–36.0)
MCV: 86.3 fL (ref 80.0–100.0)
Platelets: 125 10*3/uL — ABNORMAL LOW (ref 150–440)
RBC: 3.59 MIL/uL — ABNORMAL LOW (ref 3.80–5.20)
RDW: 14 % (ref 11.5–14.5)
WBC: 8.1 10*3/uL (ref 3.6–11.0)

## 2017-07-28 LAB — BASIC METABOLIC PANEL
ANION GAP: 6 (ref 5–15)
BUN: 17 mg/dL (ref 8–23)
CALCIUM: 8.3 mg/dL — AB (ref 8.9–10.3)
CO2: 27 mmol/L (ref 22–32)
Chloride: 103 mmol/L (ref 98–111)
Creatinine, Ser: 0.72 mg/dL (ref 0.44–1.00)
GFR calc Af Amer: >60 mL/min (ref >60)
GFR calc non Af Amer: >60 mL/min (ref >60)
GLUCOSE: 109 mg/dL — AB (ref 70–99)
POTASSIUM: 4 mmol/L (ref 3.5–5.1)
Sodium: 136 mmol/L (ref 135–145)

## 2017-07-28 NOTE — Progress Notes (Signed)
PT Cancellation Note  Patient Details Name: Jessica Norman MRN: [id number] DOB: 02-17-1950   Cancelled Treatment:    Reason Eval/Treat Not Completed: Medical issues which prohibited therapy(Pt chart reviewed, and determined therapy not approriate unitl after DVT ruled out with US . Will reattempt therapy when medically appropriate)   Grayland Jackolby [name], SPT 07/28/17,8:39 AM

## 2017-07-28 NOTE — Progress Notes (Signed)
Pt continue to complain of left leg calf and thigh pain. Continue to have some swelling on left upper thigh.   No swelling on the calf noted.  Pain meds given with no relieve. Dr. Allena Katzpatel made aware. No new orders at this time.

## 2017-07-28 NOTE — Progress Notes (Signed)
  Subjective: 2 Days Post-Op Procedure(s) (LRB): TOTAL KNEE ARTHROPLASTY (Left) Patient reports pain as 9 on 0-10 scale.  Pt is complaining of a lot of pain in the left calf. Patient is well, and has had no acute complaints or problems Plan is to go Home after hospital stay.   PT and Care management to assist with discharge planning. Negative for chest pain and shortness of breath Fever: no Gastrointestinal:Negative for nausea and vomiting Pt states that she is passing gas without pain.  Objective: Vital signs in last 24 hours: Temp:  [98.1 F (36.7 C)-98.7 F (37.1 C)] 98.5 F (36.9 C) (07/11 0019) Pulse Rate:  [61-85] 85 (07/11 0019) Resp:  [18] 18 (07/10 1656) BP: (123-152)/(66-76) 152/74 (07/11 0019) SpO2:  [93 %-96 %] 93 % (07/11 0019)  Intake/Output from previous day:  Intake/Output Summary (Last 24 hours) at 07/28/2017 0756 Last data filed at 07/27/2017 1300 Gross per 24 hour  Intake 2467.5 ml  Output -  Net 2467.5 ml    Intake/Output this shift: No intake/output data recorded.  Labs: Recent Labs    07/27/17 0442 07/28/17 0447  HGB 10.1* 10.4*   Recent Labs    07/27/17 0442 07/28/17 0447  WBC 7.3 8.1  RBC 3.52* 3.59*  HCT 30.6* 30.9*  PLT 129* 125*   Recent Labs    07/27/17 0442 07/28/17 0447  NA 141 136  K 4.0 4.0  CL 108 103  CO2 27 27  BUN 17 17  CREATININE 0.92 0.72  GLUCOSE 115* 109*  CALCIUM 8.4* 8.3*   No results for input(s): LABPT, INR in the last 72 hours.   EXAM General - Patient is Alert, Appropriate and Oriented Extremity - ABD soft Neurovascular intact Sensation intact distally Intact pulses distally Dorsiflexion/Plantar flexion intact Incision: scant drainage No cellulitis present  Positive Homan's test this AM. Dressing/Incision - blood tinged drainage. Motor Function - intact, moving foot and toes well on exam.  Abdomen soft with normal BS.  Past Medical History:  Diagnosis Date  . Arthritis   . Asthma   .  Bipolar disorder (HCC)   . GERD (gastroesophageal reflux disease)   . Hearing loss   . History of hiatal hernia 2011   treated with medication, no surgical repair  . History of Sjogren's disease   . HLD (hyperlipidemia)   . Hypertension   . Hypothyroidism   . Incomplete bladder emptying   . Memory loss   . Obesity   . Recurrent UTI   . Vision abnormalities     Assessment/Plan: 2 Days Post-Op Procedure(s) (LRB): TOTAL KNEE ARTHROPLASTY (Left) Active Problems:   Status post total knee replacement using cement, left  Estimated body mass index is 32.31 kg/m as calculated from the following:   Height as of this encounter: 5\' 9"  (1.753 m).   Weight as of this encounter: 99.2 kg (218 lb 12.8 oz). Up with therapy  Labs reviewed this morning.  CBC and BMP ordered for tomorrow morning. Will obtain US of the left leg to r/o DVT. Up with therapy today.  Continue to work on having a BM. Current plan is for discharge home with HHPT. Pending results of US and working with PT today, plan will be for discharge home either today or tomorrow.  DVT Prophylaxis - Lovenox, Foot Pumps and TED hose Weight-Bearing as tolerated to left leg  J. Horris LatinoLance McGhee, PA-C [facility] 07/28/2017, 7:56 AM

## 2017-07-28 NOTE — Progress Notes (Signed)
Physical Therapy Treatment Patient Details Name: Jessica Norman MRN: [id number] DOB: 1950-06-16 Today's Date: 07/28/2017    History of Present Illness Pt is a 67 y/o F admitted for acute hospitalization status post L TKR (07/26/17), WBAT    PT Comments    Pt was appropriate for therapy post doppler ruling out DVT. condition has worsened today; showing decreased activity tolerance, increased pain levels, and decreased ROM in L knee. Pt reports NPS 8/10 on PT arrival, and is unable to stand today without mod assist 2+ from elevated surface Pt was not able to walk today except for side stepping needed for transfer to recliner. Pt reported feeling weaker today and was not able to tolerate therapeutic exercise due to pain. Would benefit from skilled PT to address above deficits and promote optimal return to PLOF. It is not recommended that pt be discharged at this time, and that further PT evaluation and treatment be conducted next available date.    Follow Up Recommendations  Home health PT     Equipment Recommendations  Rolling walker with 5" wheels    Recommendations for Other Services       Precautions / Restrictions Precautions Precautions: Fall;Knee Precaution Booklet Issued: No Restrictions Weight Bearing Restrictions: Yes LLE Weight Bearing: Weight bearing as tolerated    Mobility  Bed Mobility Overal bed mobility: Needs Assistance Bed Mobility: Sit to Supine       Sit to supine: Mod assist   General bed mobility comments: Needs verbal and physical cues from therapist to complete task  Transfers Overall transfer level: Needs assistance Equipment used: Rolling walker (2 wheeled) Transfers: Sit to/from Stand Sit to Stand: From elevated surface;Mod assist;+2 physical assistance         General transfer comment: Pt unable to perform sit to stand from low surface with max assist +1. Pt able to perform sit to stand from elevated surface and mod assist  2+.  Ambulation/Gait Ambulation/Gait assistance: (Pt unable to ambulate today due to high pain levels and difficulty maintaining standing. ) Gait Distance (Feet): 0 Feet Assistive device: Rolling walker (2 wheeled)           Stairs             Wheelchair Mobility    Modified Rankin (Stroke Patients Only)       Balance Overall balance assessment: Needs assistance Sitting-balance support: No upper extremity supported;Feet supported Sitting balance-Leahy Scale: Good     Standing balance support: Bilateral upper extremity supported;During functional activity Standing balance-Leahy Scale: Poor Standing balance comment: Pt rewuired UE support to mainitina standing and relied on therapist for ant/post sway control.                             Cognition Arousal/Alertness: Awake/alert Behavior During Therapy: Restless Overall Cognitive Status: Within Functional Limits for tasks assessed                                        Exercises Total Joint Exercises Straight Leg Raises: AROM;Left(Only able to do 2 reps due to pain) Long Arc Quad: AROM;Left(1 rep limited by pain) Knee Flexion: AAROM;Left(One rep due to pain) Goniometric ROM: 10-110 deg Other Exercises Other Exercises: Bed Mobility: Supine to sit mod assist +1, Sit to stand mod assist +2. Transfer: standing pivot transfer to recliner mod assist +2  General Comments        Pertinent Vitals/Pain Pain Assessment: 0-10 Pain Score: 8  Pain Location: L knee Pain Descriptors / Indicators: Discomfort;Constant Pain Intervention(s): Limited activity within patient's tolerance;Monitored during session;Premedicated before session;Ice applied;Repositioned    Home Living                      Prior Function            PT Goals (current goals can now be found in the care plan section) Acute Rehab PT Goals Patient Stated Goal: to get stronger PT Goal Formulation: With  patient Time For Goal Achievement: 08/09/17 Potential to Achieve Goals: Good Progress towards PT goals: PT to reassess next treatment    Frequency    BID      PT Plan Discharge plan needs to be updated    Co-evaluation              AM-PAC PT "6 Clicks" Daily Activity  Outcome Measure  Difficulty turning over in bed (including adjusting bedclothes, sheets and blankets)?: A Little Difficulty moving from lying on back to sitting on the side of the bed? : A Lot Difficulty sitting down on and standing up from a chair with arms (e.g., wheelchair, bedside commode, etc,.)?: Unable Help needed moving to and from a bed to chair (including a wheelchair)?: A Lot Help needed walking in hospital room?: A Lot Help needed climbing 3-5 steps with a railing? : Total 6 Click Score: 11    End of Session Equipment Utilized During Treatment: Gait belt Activity Tolerance: Patient limited by pain   Nurse Communication: Mobility status;Patient requests pain meds PT Visit Diagnosis: Difficulty in walking, not elsewhere classified (R26.2);Muscle weakness (generalized) (M62.81);Pain;Unsteadiness on feet (R26.81) Pain - Right/Left: Left Pain - part of body: Knee     Time: 1610-9604 PT Time Calculation (min) (ACUTE ONLY): 30 min  Charges:                       G Codes:       Grayland Jack, SPT August 04, 2017,3:27 PM

## 2017-07-29 LAB — BASIC METABOLIC PANEL
Anion gap: 6 (ref 5–15)
BUN: 13 mg/dL (ref 8–23)
CALCIUM: 8.3 mg/dL — AB (ref 8.9–10.3)
CO2: 28 mmol/L (ref 22–32)
CREATININE: 0.63 mg/dL (ref 0.44–1.00)
Chloride: 99 mmol/L (ref 98–111)
GFR calc non Af Amer: >60 mL/min (ref >60)
GLUCOSE: 101 mg/dL — AB (ref 70–99)
Potassium: 3.9 mmol/L (ref 3.5–5.1)
Sodium: 133 mmol/L — ABNORMAL LOW (ref 135–145)

## 2017-07-29 MED ORDER — ENOXAPARIN SODIUM 40 MG/0.4ML ~~LOC~~ SOLN: 40.0000 mg | SUBCUTANEOUS | 0 refills | Status: DC

## 2017-07-29 MED ORDER — OXYCODONE HCL 5 MG PO TABS: 5.0000 mg | ORAL_TABLET | ORAL | 0 refills | Status: DC | PRN

## 2017-07-29 MED ORDER — TRAMADOL HCL 50 MG PO TABS: 50.0000 mg | ORAL_TABLET | Freq: Four times a day (QID) | ORAL | 0 refills | Status: DC | PRN

## 2017-07-29 NOTE — Progress Notes (Signed)
  Subjective: 3 Days Post-Op Procedure(s) (LRB): TOTAL KNEE ARTHROPLASTY (Left) Patient reports pain as 5 on 0-10 scale.  Pain is much improved compared to yesterday. Patient is well, and has had no acute complaints or problems Plan is to go Home after hospital stay.   Negative for chest pain and shortness of breath Fever: no Gastrointestinal:Negative for nausea and vomiting Pt states that she is passing gas without pain. US of left leg negative for DVT.  Objective: Vital signs in last 24 hours: Temp:  [98.1 F (36.7 C)-98.9 F (37.2 C)] 98.9 F (37.2 C) (07/12 0010) Pulse Rate:  [84-88] 84 (07/12 0010) Resp:  [18] 18 (07/12 0010) BP: (128-168)/(58-77) 128/58 (07/12 0010) SpO2:  [93 %-96 %] 96 % (07/12 0010)  Intake/Output from previous day:  Intake/Output Summary (Last 24 hours) at 07/29/2017 0945 Last data filed at 07/28/2017 1300 Gross per 24 hour  Intake 240 ml  Output -  Net 240 ml    Intake/Output this shift: No intake/output data recorded.  Labs: Recent Labs    07/27/17 0442 07/28/17 0447  HGB 10.1* 10.4*   Recent Labs    07/27/17 0442 07/28/17 0447  WBC 7.3 8.1  RBC 3.52* 3.59*  HCT 30.6* 30.9*  PLT 129* 125*   Recent Labs    07/28/17 0447 07/29/17 0350  NA 136 133*  K 4.0 3.9  CL 103 99  CO2 27 28  BUN 17 13  CREATININE 0.72 0.63  GLUCOSE 109* 101*  CALCIUM 8.3* 8.3*   No results for input(s): LABPT, INR in the last 72 hours.   EXAM General - Patient is Alert, Appropriate and Oriented Extremity - ABD soft Neurovascular intact Sensation intact distally Intact pulses distally Dorsiflexion/Plantar flexion intact Incision: scant drainage No cellulitis present  Dressing/Incision - blood tinged drainage. Motor Function - intact, moving foot and toes well on exam.  Abdomen soft with normal BS.  Past Medical History:  Diagnosis Date  . Arthritis   . Asthma   . Bipolar disorder (HCC)   . GERD (gastroesophageal reflux disease)   .  Hearing loss   . History of hiatal hernia 2011   treated with medication, no surgical repair  . History of Sjogren's disease   . HLD (hyperlipidemia)   . Hypertension   . Hypothyroidism   . Incomplete bladder emptying   . Memory loss   . Obesity   . Recurrent UTI   . Vision abnormalities     Assessment/Plan: 3 Days Post-Op Procedure(s) (LRB): TOTAL KNEE ARTHROPLASTY (Left) Active Problems:   Status post total knee replacement using cement, left  Estimated body mass index is 32.31 kg/m as calculated from the following:   Height as of this encounter: 5\' 9"  (1.753 m).   Weight as of this encounter: 99.2 kg (218 lb 12.8 oz). Up with therapy  Labs reviewed this morning.   US of leg negative for DVT.  Pain much improved today. Up with therapy today.  Continue to work on having a BM, patient is passing gas without pain. Current plan is for discharge home with HHPT later this afternoon.  DVT Prophylaxis - Lovenox, Foot Pumps and TED hose Weight-Bearing as tolerated to left leg  J. Horris LatinoLance McGhee, PA-C [facility] 07/29/2017, 9:45 AM

## 2017-07-29 NOTE — Discharge Summary (Signed)
Physician Discharge Summary  Patient ID: [name] MRN: [id number] DOB/AGE: 09/06/1950 67 y.o.  Admit date: 07/26/2017 Discharge date: 07/29/2017  Admission Diagnoses:  PRIMARY OSTEOARTHRITIS OF LEFT KNEE  Discharge Diagnoses: Patient Active Problem List   Diagnosis Date Noted  . Status post total knee replacement using cement, left 07/26/2017  . Gait disturbance 05/25/2016  . Abnormal brain MRI 05/25/2016  . Memory loss 08/28/2015  . Incontinence 05/06/2015  . Numbness 11/25/2014  . Insomnia 08/01/2014  . Abdominal pain, left upper quadrant 07/16/2014  . Abnormal finding on ultrasound 07/16/2014  . Fatty liver disease, nonalcoholic 07/16/2014  . Small intestinal bacterial overgrowth 07/16/2014  . Asthma without status asthmaticus 06/20/2014  . HLD (hyperlipidemia) 06/20/2014  . Dermatitis, eczematoid 06/20/2014  . Acid reflux 06/20/2014  . Cephalalgia 06/20/2014  . BP (high blood pressure) 06/20/2014  . Adiposity 06/20/2014  . Arthritis, degenerative 06/20/2014  . Allergic rhinitis, seasonal 06/20/2014  . Proximal limb weakness 06/20/2014  . Proximal leg weakness 06/20/2014  . Disturbed cognition 06/20/2014  . Carpal tunnel syndrome 06/20/2014  . Tinnitus 06/20/2014    Past Medical History:  Diagnosis Date  . Arthritis   . Asthma   . Bipolar disorder (HCC)   . GERD (gastroesophageal reflux disease)   . Hearing loss   . History of hiatal hernia 2011   treated with medication, no surgical repair  . History of Sjogren's disease   . HLD (hyperlipidemia)   . Hypertension   . Hypothyroidism   . Incomplete bladder emptying   . Memory loss   . Obesity   . Recurrent UTI   . Vision abnormalities    Transfusion: None.   Consultants (if any):   Discharged Condition: Improved  Hospital Course: Jessica Norman is an 67 y.o. female who was admitted 07/26/2017 with a diagnosis of left knee osteoarthritis and went to the operating room on 07/26/2017 and underwent  the above named procedures.   Pt complained of severe left calf pain on POD2, [place] was negative for DVT.   Surgeries: Procedure(s): TOTAL KNEE ARTHROPLASTY on 07/26/2017 Patient tolerated the surgery well. Taken to PACU where she was stabilized and then transferred to the orthopedic floor.  Started on Lovenox 40mg  q 24 hrs. Foot pumps applied bilaterally at 80 mm. Heels elevated on bed with rolled towels. No evidence of DVT. Negative Homan. Physical therapy started on day #1 for gait training and transfer. OT started day #1 for ADL and assisted devices.  Patient's IV was d/c on POD1, foley d/c shortly following surgery.  Implants: Left TKA using all-cemented Bi[employer] Vanguard system with a 72.5 mm PCR femur, a 75 mm tibial tray with a 10 mm E-poly insert, and a 34 x 8.5 mm all-poly 3-pegged domed patella.  She was given perioperative antibiotics:  Anti-infectives (From admission, onward)   Start     Dose/Rate Route Frequency Ordered Stop   07/26/17 1345  clindamycin (CLEOCIN) IVPB 900 mg     900 mg 100 mL/hr over 30 Minutes Intravenous Every 6 hours 07/26/17 1132 07/27/17 0312   07/26/17 1200  hydroxychloroquine (PLAQUENIL) tablet 200 mg     200 mg Oral Daily 07/26/17 1132     07/26/17 0557  clindamycin (CLEOCIN) 900 MG/50ML IVPB    Note to Pharmacy:  Hallaji, Violet   : cabinet override      07/26/17 0557 07/26/17 0740   07/26/17 0245  clindamycin (CLEOCIN) IVPB 900 mg     900 mg 100 mL/hr over 30 Minutes  Intravenous  Once 07/26/17 0233 07/26/17 0755    .  She was given sequential compression devices, early ambulation, and Lovenox for DVT prophylaxis.  She benefited maximally from the hospital stay and there were no complications.    Recent vital signs:  Vitals:   07/28/17 1611 07/29/17 0010  BP: (!) 168/77 (!) 128/58  Pulse: 88 84  Resp:  18  Temp: 98.1 F (36.7 C) 98.9 F (37.2 C)  SpO2: 93% 96%    Recent laboratory studies:  Lab Results  Component Value Date   HGB  10.4 (L) 07/28/2017   HGB 10.1 (L) 07/27/2017   HGB 12.7 07/13/2017   Lab Results  Component Value Date   WBC 8.1 07/28/2017   PLT 125 (L) 07/28/2017   Lab Results  Component Value Date   INR 0.99 07/13/2017   Lab Results  Component Value Date   NA 133 (L) 07/29/2017   K 3.9 07/29/2017   CL 99 07/29/2017   CO2 28 07/29/2017   BUN 13 07/29/2017   CREATININE 0.63 07/29/2017   GLUCOSE 101 (H) 07/29/2017    Discharge Medications:   Allergies as of 07/29/2017      Reactions   Codeine Itching   Penicillin G Itching   Has patient had a PCN reaction causing immediate rash, facial/tongue/throat swelling, SOB or lightheadedness with hypotension: No Has patient had a PCN reaction causing severe rash involving mucus membranes or skin necrosis: No Has patient had a PCN reaction that required hospitalization: No Has patient had a PCN reaction occurring within the last 10 years: No If all of the above answers are "NO", then may proceed with Cephalosporin use.   Sulfa Antibiotics Itching      Medication List    STOP taking these medications   aspirin EC 81 MG tablet     TAKE these medications   aluminum hydroxide-magnesium carbonate 95-358 MG/15ML Susp Commonly known as:  GAVISCON Take 15-30 mLs by mouth 3 (three) times daily as needed for indigestion or heartburn.   amLODipine 10 MG tablet Commonly known as:  NORVASC Take 10 mg by mouth daily.   CLEAR EYES FOR DRY EYES 1-0.25 % Soln Generic drug:  Carboxymethylcellul-Glycerin Place 1 drop into both eyes 3 (three) times daily as needed (for dry eyes.).   cycloSPORINE 0.05 % ophthalmic emulsion Commonly known as:  RESTASIS Place 1 drop into both eyes 2 (two) times daily.   divalproex 500 MG 24 hr tablet Commonly known as:  DEPAKOTE ER Take 1,000 mg by mouth every evening.   enoxaparin 40 MG/0.4ML injection Commonly known as:  LOVENOX Inject 0.4 mLs (40 mg total) into the skin daily.   esomeprazole 40 MG  capsule Commonly known as:  NEXIUM Take 40 mg by mouth daily before breakfast.   hydroxychloroquine 200 MG tablet Commonly known as:  PLAQUENIL Take 200 mg by mouth daily.   lisinopril 10 MG tablet Commonly known as:  PRINIVIL,ZESTRIL Take 10 mg by mouth daily.   lovastatin 10 MG tablet Commonly known as:  MEVACOR Take 10 mg by mouth daily.   multivitamin with minerals Tabs tablet Take 1 tablet by mouth daily.   oxyCODONE 5 MG immediate release tablet Commonly known as:  Oxy IR/ROXICODONE Take 1-2 tablets (5-10 mg total) by mouth every 4 (four) hours as needed for moderate pain.   QUEtiapine 25 MG tablet Commonly known as:  SEROQUEL Take 50 mg by mouth at bedtime.   sertraline 100 MG tablet Commonly known as:  ZOLOFT Take 100 mg by mouth daily.   sulindac 200 MG tablet Commonly known as:  CLINORIL Take 200 mg by mouth 2 (two) times daily.   traMADol 50 MG tablet Commonly known as:  ULTRAM Take 1-2 tablets (50-100 mg total) by mouth every 6 (six) hours as needed for moderate pain.            Durable Medical Equipment  (From admission, onward)        Start     Ordered   07/26/17 1132  DME 3 n 1  Once     07/26/17 1132   07/26/17 1132  DME Bedside commode  Once    Question:  Patient needs a bedside commode to treat with the following condition  Answer:  Status post total knee replacement using cement, left   07/26/17 1132   07/26/17 1132  DME Walker rolling  Once    Question:  Patient needs a walker to treat with the following condition  Answer:  Status post total knee replacement using cement, left   07/26/17 1132      Diagnostic Studies: Dg Chest 2 View  Result Date: 07/14/2017 CLINICAL DATA:  Preop for knee replacement, history of asthma and hypertension EXAM: CHEST - 2 VIEW COMPARISON:  Chest x-ray of 02/03/2015 FINDINGS: There is persistent slight elevation of the right hemidiaphragm. No active infiltrate or effusion is seen. Mediastinal and hilar  contours are unremarkable. The heart is within normal limits in size. A lower anterior cervical spine fusion plate is present. IMPRESSION: No active cardiopulmonary disease. Chronic elevation of the right hemidiaphragm. Electronically Signed   By: Dwyane Dee M.D.   On: 07/14/2017 08:15   US Venous Img Lower Unilateral Left  Result Date: 07/28/2017 CLINICAL DATA:  Left lower extremity pain and edema. Recent left total knee replacement. Evaluate for DVT. EXAM: LEFT LOWER EXTREMITY VENOUS DOPPLER ULTRASOUND TECHNIQUE: Gray-scale sonography with graded compression, as well as color Doppler and duplex ultrasound were performed to evaluate the lower extremity deep venous systems from the level of the common femoral vein and including the common femoral, femoral, profunda femoral, popliteal and calf veins including the posterior tibial, peroneal and gastrocnemius veins when visible. The superficial great saphenous vein was also interrogated. Spectral Doppler was utilized to evaluate flow at rest and with distal augmentation maneuvers in the common femoral, femoral and popliteal veins. COMPARISON:  Left knee radiographs - 07/26/2017 FINDINGS: Contralateral Common Femoral Vein: Respiratory phasicity is normal and symmetric with the symptomatic side. No evidence of thrombus. Normal compressibility. Common Femoral Vein: No evidence of thrombus. Normal compressibility, respiratory phasicity and response to augmentation. Saphenofemoral Junction: No evidence of thrombus. Normal compressibility and flow on color Doppler imaging. Profunda Femoral Vein: No evidence of thrombus. Normal compressibility and flow on color Doppler imaging. Femoral Vein: No evidence of thrombus. Normal compressibility, respiratory phasicity and response to augmentation. Popliteal Vein: No evidence of thrombus. Normal compressibility, respiratory phasicity and response to augmentation. Calf Veins: No evidence of thrombus. Normal compressibility and  flow on color Doppler imaging. Superficial Great Saphenous Vein: No evidence of thrombus. Normal compressibility. Venous Reflux:  None. Other Findings:  None. IMPRESSION: No evidence of DVT within the left lower extremity. Electronically Signed   By: Simonne Come M.D.   On: 07/28/2017 10:15   Dg Knee Left Port  Result Date: 07/26/2017 CLINICAL DATA:  Knee replacement EXAM: PORTABLE LEFT KNEE - 1-2 VIEW COMPARISON:  None. FINDINGS: Two view exam of the left knee shows the  patient to be status post tricompartmental knee replacement. Lateral film limited by oblique positioning. Within this limitation, there is no evidence for immediate hardware complication. Gas noted in the soft tissues and joint space compatible with the recent surgery. IMPRESSION: Status post tricompartmental knee replacement without evidence for immediate hardware complications. Electronically Signed   By: Kennith Center M.D.   On: 07/26/2017 10:57   Disposition:  Plan is for discharge today with HHPT.  Follow-up Information    Anson [place], PA-C Follow up in 14 day(s).   Specialty:  Physician Assistant Why:  Mindi Slicker information: [address] [place] [address] [phone]          Signed: Meriel Pica PA-C 07/29/2017, 9:49 AM

## 2017-07-29 NOTE — Care Management (Signed)
Notified [facility] of discharge.

## 2017-07-29 NOTE — Progress Notes (Signed)
Pt has not had BM and states that she would feel more comfortable having one at home. MD made aware. Pt is not distended and is passing flatus. VSS. IV removed. Pt educated on discharge materials and verbalizes understanding. Wheeled to visitors entrance and assisted into husbands vehicle.

## 2017-07-29 NOTE — Care Management (Signed)
CM informed that patient does not have a walker.  States she does have a bedside commode.  Notified [facility] and walker delivered to room.

## 2017-07-29 NOTE — Care Management Important Message (Signed)
Important Message  Patient Details  Name: Jessica Norman MRN: [id number] Date of Birth: Aug 09, 1950   Medicare Important Message Given:  Yes    Olegario MessierKathy A Latrelle Fuston 07/29/2017, 11:37 AM

## 2017-07-29 NOTE — Discharge Instructions (Signed)

## 2017-07-29 NOTE — Progress Notes (Signed)
Physical Therapy Treatment Patient Details Name: Jessica Norman MRN: [id number] DOB: October 22, 1950 Today's Date: 07/29/2017    History of Present Illness Pt is a 67 y/o F admitted for acute hospitalization status post L TKR (07/26/17), WBAT    PT Comments    Pt progressed well with therapy today, regaining some lost progress observed day prior. Pt was able to perform bed mobility and transfers with increased ease, only requiring CGA to min assist from therapist, and needing verbal and physical cues less often. Pt ambulatory ability was still limited due to pain and fatigue ( 150 ft). Pt still exhibits a step to gait pattern and shows difficulty with LLE weight shifting as well as toe off. However pt ambulatory skills are safe and proficient for home discharge. Pt navigated stairs safely and with proper technique, using hand rail assist, lateral stepping strategy, and CGA from therapist. Pt was able to perform rehab exercises with proper form and understanding with minimal cueing, and expressed verbal knowledge of post TKA exercise booklet and importance of completing HEP. Overall pt is progressing towards/met therapy goals and no longer requires acute care PT.  Discharge to home, with HHPT is recommended at this time.   Follow Up Recommendations  Home health PT     Equipment Recommendations  Rolling walker with 5" wheels    Recommendations for Other Services       Precautions / Restrictions Precautions Precautions: Fall;Knee Precaution Booklet Issued: Yes (comment) Restrictions Weight Bearing Restrictions: Yes LLE Weight Bearing: Weight bearing as tolerated    Mobility  Bed Mobility Overal bed mobility: Needs Assistance Bed Mobility: Sit to Supine     Supine to sit: Min assist Sit to supine: Min assist   General bed mobility comments: Requires Increased effort and slight physical cueing to bring trunk forward.  Transfers Overall transfer level: Needs assistance Equipment used:  Rolling walker (2 wheeled) Transfers: Sit to/from Stand Sit to Stand: Mod assist         General transfer comment: Pt performs sit to stand with proper and safe technique, however needs verbal cue to slide forward to edge of bed and proper hand placement during push off.  Ambulation/Gait Ambulation/Gait assistance: Min assist Gait Distance (Feet): 150 Feet(Pt still limtied in ambulation length compared to days prior due to pain, but shows imporvment from last session. ) Assistive device: Rolling walker (2 wheeled) Gait Pattern/deviations: Step-to pattern;Decreased stance time - left;Decreased weight shift to left;Decreased step length - right;Antalgic Gait velocity: decreased   General Gait Details: Pt demosntrated step to gait pattern with difficulty progressing to step through pattern even with therapist cueing. Patient was cued on improving toe off and weight shift during gait.     Stairs Stairs: Yes Stairs assistance: Modified independent (Device/Increase time) Stair Management: One rail Left;Sideways Number of Stairs: 3 General stair comments: Pt demonstrated safe and proper stair climbing techniques with help of hand rail and with threpast demonstration and verbal cueing. Pt acknowledged understanding of technique.    Wheelchair Mobility    Modified Rankin (Stroke Patients Only)       Balance Overall balance assessment: Needs assistance Sitting-balance support: No upper extremity supported;Feet supported Sitting balance-Leahy Scale: Good Sitting balance - Comments: Pt is able to maintain sitting balance wihtout abnormal sway or need for UE support.   Standing balance support: Bilateral upper extremity supported;During functional activity Standing balance-Leahy Scale: Good Standing balance comment: Pt was able to maintain balance while walking with assitive device for support and min  assist from therapist.                             Cognition  Arousal/Alertness: Awake/alert Behavior During Therapy: WFL for tasks assessed/performed Overall Cognitive Status: Within Functional Limits for tasks assessed                                        Exercises Total Joint Exercises Quad Sets: 20 reps;AROM;Left Long Arc Quad: AROM;10 reps;Left Knee Flexion: Left;AROM;10 reps;AAROM;5 reps Goniometric ROM: 10- 99 deg(primarily limited due to pain) Other Exercises Other Exercises: Bed Mobility: Supine to sit , Sit to stand. Gait: 100 ft with RW and min assist from therapist. Other Exercises: Toilet transfer, ambulatory with RW, cga/min assist; slow and guarded, but fair/good L knee control. sit/stand from toilet with RW, cga/min assist. Standing balance at sink for hand hygiene with RW. and therapist CGA.    General Comments        Pertinent Vitals/Pain Pain Assessment: 0-10 Pain Score: 5  Pain Location: L knee Pain Descriptors / Indicators: Discomfort;Constant    Home Living                      Prior Function            PT Goals (current goals can now be found in the care plan section) Acute Rehab PT Goals Patient Stated Goal: to get stronger PT Goal Formulation: With patient Time For Goal Achievement: 08/09/17 Potential to Achieve Goals: Good Progress towards PT goals: Progressing toward goals    Frequency    BID      PT Plan Current plan remains appropriate    Co-evaluation              AM-PAC PT "6 Clicks" Daily Activity  Outcome Measure  Difficulty turning over in bed (including adjusting bedclothes, sheets and blankets)?: None Difficulty moving from lying on back to sitting on the side of the bed? : A Little Difficulty sitting down on and standing up from a chair with arms (e.g., wheelchair, bedside commode, etc,.)?: A Little Help needed moving to and from a bed to chair (including a wheelchair)?: A Little Help needed walking in hospital room?: A Little Help needed climbing  3-5 steps with a railing? : A Little 6 Click Score: 19    End of Session Equipment Utilized During Treatment: Gait belt Activity Tolerance: Patient limited by lethargy;Patient limited by pain(Pt showed some drowsiness near end of session, may be related to pain medication recieved at beginning fo therapy) Patient left: in chair;with chair alarm set;with call bell/phone within reach Nurse Communication: Patient requests pain meds;Mobility status PT Visit Diagnosis: Difficulty in walking, not elsewhere classified (R26.2);Muscle weakness (generalized) (M62.81);Pain;Unsteadiness on feet (R26.81) Pain - Right/Left: Left Pain - part of body: Knee     Time: 2111-7356 PT Time Calculation (min) (ACUTE ONLY): 50 min  Charges:                       G Codes:       Hortencia Conradi, SPT 08/18/2017,1:03 PM

## 2017-08-01 ENCOUNTER — Telehealth: Payer: Self-pay

## 2017-08-01 NOTE — Telephone Encounter (Signed)
Flagged on EMMI report for having questions about discharge papers.  Called and spoke with patient.  She mentioned she read through her discharge papers and does not have any questions about them.  No further questions or concerns at this time.  I thanked her for her time and informed her she would receive one more automated call checking on her in the next few days.

## 2018-03-10 ENCOUNTER — Other Ambulatory Visit: Payer: Self-pay | Admitting: Family Medicine

## 2018-03-10 DIAGNOSIS — Z1231 Encounter for screening mammogram for malignant neoplasm of breast: Secondary | ICD-10-CM

## 2018-05-19 ENCOUNTER — Other Ambulatory Visit
Admission: RE | Admit: 2018-05-19 | Discharge: 2018-05-19 | Disposition: A | Discharge status: Home / Self Care | Payer: Medicare Other | Source: Ambulatory Visit | Attending: Student | Admitting: Student

## 2018-05-19 DIAGNOSIS — R6889 Other general symptoms and signs: Secondary | ICD-10-CM | POA: Diagnosis present

## 2018-05-19 DIAGNOSIS — R079 Chest pain, unspecified: Secondary | ICD-10-CM | POA: Insufficient documentation

## 2018-05-19 DIAGNOSIS — R0602 Shortness of breath: Secondary | ICD-10-CM | POA: Diagnosis not present

## 2018-05-19 LAB — TROPONIN I: Troponin I: <0.03 ng/mL (ref <0.03)

## 2018-05-19 LAB — FIBRIN DERIVATIVES D-DIMER (ARMC ONLY): Fibrin derivatives D-dimer (ARMC): 496.47 ng/mL (FEU) (ref 0.00–499.00)

## 2018-05-19 LAB — BRAIN NATRIURETIC PEPTIDE: B Natriuretic Peptide: 173 pg/mL — ABNORMAL HIGH (ref 0.0–100.0)

## 2018-12-27 ENCOUNTER — Emergency Department: Payer: Medicare Other

## 2018-12-27 ENCOUNTER — Encounter: Payer: Self-pay | Admitting: Emergency Medicine

## 2018-12-27 ENCOUNTER — Emergency Department
Admission: EM | Admit: 2018-12-27 | Discharge: 2018-12-27 | Disposition: A | Discharge status: Home / Self Care | Payer: Medicare Other | Attending: Emergency Medicine | Admitting: Emergency Medicine

## 2018-12-27 ENCOUNTER — Other Ambulatory Visit: Payer: Self-pay

## 2018-12-27 DIAGNOSIS — I10 Essential (primary) hypertension: Secondary | ICD-10-CM | POA: Diagnosis not present

## 2018-12-27 DIAGNOSIS — Z87891 Personal history of nicotine dependence: Secondary | ICD-10-CM | POA: Diagnosis not present

## 2018-12-27 DIAGNOSIS — R1010 Upper abdominal pain, unspecified: Secondary | ICD-10-CM | POA: Insufficient documentation

## 2018-12-27 DIAGNOSIS — Z96652 Presence of left artificial knee joint: Secondary | ICD-10-CM | POA: Diagnosis not present

## 2018-12-27 DIAGNOSIS — J45909 Unspecified asthma, uncomplicated: Secondary | ICD-10-CM | POA: Diagnosis not present

## 2018-12-27 DIAGNOSIS — R1012 Left upper quadrant pain: Secondary | ICD-10-CM | POA: Diagnosis not present

## 2018-12-27 DIAGNOSIS — E039 Hypothyroidism, unspecified: Secondary | ICD-10-CM | POA: Insufficient documentation

## 2018-12-27 DIAGNOSIS — Z79899 Other long term (current) drug therapy: Secondary | ICD-10-CM | POA: Diagnosis not present

## 2018-12-27 LAB — URINALYSIS, COMPLETE (UACMP) WITH MICROSCOPIC
Bacteria, UA: NONE SEEN
Bilirubin Urine: NEGATIVE
Glucose, UA: NEGATIVE mg/dL
Hgb urine dipstick: NEGATIVE
Ketones, ur: NEGATIVE mg/dL
Leukocytes,Ua: NEGATIVE
Nitrite: NEGATIVE
Protein, ur: NEGATIVE mg/dL
Specific Gravity, Urine: 1.02 (ref 1.005–1.030)
pH: 7 (ref 5.0–8.0)

## 2018-12-27 LAB — CBC
HCT: 37 % (ref 36.0–46.0)
Hemoglobin: 12.1 g/dL (ref 12.0–15.0)
MCH: 28.8 pg (ref 26.0–34.0)
MCHC: 32.7 g/dL (ref 30.0–36.0)
MCV: 88.1 fL (ref 80.0–100.0)
Platelets: 163 10*3/uL (ref 150–400)
RBC: 4.2 MIL/uL (ref 3.87–5.11)
RDW: 13.4 % (ref 11.5–15.5)
WBC: 4.3 10*3/uL (ref 4.0–10.5)
nRBC: 0 % (ref 0.0–0.2)

## 2018-12-27 LAB — LIPASE, BLOOD: Lipase: 43 U/L (ref 11–51)

## 2018-12-27 LAB — COMPREHENSIVE METABOLIC PANEL
ALT: 12 U/L (ref 0–44)
AST: 20 U/L (ref 15–41)
Albumin: 3.4 g/dL — ABNORMAL LOW (ref 3.5–5.0)
Alkaline Phosphatase: 39 U/L (ref 38–126)
Anion gap: 9 (ref 5–15)
BUN: 20 mg/dL (ref 8–23)
CO2: 27 mmol/L (ref 22–32)
Calcium: 9.2 mg/dL (ref 8.9–10.3)
Chloride: 102 mmol/L (ref 98–111)
Creatinine, Ser: 1.06 mg/dL — ABNORMAL HIGH (ref 0.44–1.00)
GFR calc Af Amer: >60 mL/min (ref >60)
GFR calc non Af Amer: 54 mL/min — ABNORMAL LOW (ref >60)
Glucose, Bld: 84 mg/dL (ref 70–99)
Potassium: 4.1 mmol/L (ref 3.5–5.1)
Sodium: 138 mmol/L (ref 135–145)
Total Bilirubin: 0.4 mg/dL (ref 0.3–1.2)
Total Protein: 7.3 g/dL (ref 6.5–8.1)

## 2018-12-27 MED ORDER — DICYCLOMINE HCL 20 MG PO TABS: 20.0000 mg | ORAL_TABLET | Freq: Three times a day (TID) | ORAL | 0 refills | Status: DC | PRN

## 2018-12-27 NOTE — Discharge Instructions (Signed)
As we discussed please follow-up with your GI doctor for further evaluation.  Return to the emergency department for any personally concerning symptoms such as worsening pain or development of fever.  Your CT scan also showed an ovarian cyst, please follow-up with your primary care doctor to discuss whether repeat imaging would be needed in several months to reevaluate.

## 2018-12-27 NOTE — ED Provider Notes (Signed)
[facility] Emergency Department Provider Note  Time seen: 1:29 PM  I have reviewed the triage vital signs and the nursing notes.   HISTORY  Chief Complaint Abdominal Pain   HPI Jessica Norman is a 68 y.o. female with a past medical history of arthritis, asthma, bipolar, hypertension, hyperlipidemia, presents to the emergency department for upper abdominal pain.  According to the patient for the past 3 weeks she has been experiencing intermittent upper abdominal pain.  States the pain is sharp and somewhat severe when it occurs mostly in the left upper quadrant but is sometimes in the middle of the upper abdomen as well.  States she has not been eating as much.  Denies any dysuria denies any diarrhea or constipation.  No fever cough or shortness of breath.  Patient states she saw her PCP who prescribed her antibiotics for possible diverticulitis but did not do any confirmatory test.  She is since finished the antibiotics and states she does not feel any better and the pain still is intermittently occurring.   Past Medical History:  Diagnosis Date  . Arthritis   . Asthma   . Bipolar disorder (HCC)   . GERD (gastroesophageal reflux disease)   . Hearing loss   . History of hiatal hernia 2011   treated with medication, no surgical repair  . History of Sjogren's disease (HCC)   . HLD (hyperlipidemia)   . Hypertension   . Hypothyroidism   . Incomplete bladder emptying   . Memory loss   . Obesity   . Recurrent UTI   . Vision abnormalities     Patient Active Problem List   Diagnosis Date Noted  . Status post total knee replacement using cement, left 07/26/2017  . Gait disturbance 05/25/2016  . Abnormal brain MRI 05/25/2016  . Memory loss 08/28/2015  . Incontinence 05/06/2015  . Numbness 11/25/2014  . Insomnia 08/01/2014  . Abdominal pain, left upper quadrant 07/16/2014  . Abnormal finding on ultrasound 07/16/2014  . Fatty liver disease, nonalcoholic  07/16/2014  . Small intestinal bacterial overgrowth 07/16/2014  . Asthma without status asthmaticus 06/20/2014  . HLD (hyperlipidemia) 06/20/2014  . Dermatitis, eczematoid 06/20/2014  . Acid reflux 06/20/2014  . Cephalalgia 06/20/2014  . BP (high blood pressure) 06/20/2014  . Adiposity 06/20/2014  . Arthritis, degenerative 06/20/2014  . Allergic rhinitis, seasonal 06/20/2014  . Proximal limb weakness 06/20/2014  . Proximal leg weakness 06/20/2014  . Disturbed cognition 06/20/2014  . Carpal tunnel syndrome 06/20/2014  . Tinnitus 06/20/2014    Past Surgical History:  Procedure Laterality Date  . ABDOMINAL HYSTERECTOMY    . BACK SURGERY    . CARPAL TUNNEL RELEASE Right   . EXCISION OF TONGUE LESION N/A 12/19/2014   Procedure: EXCISION OF TONGUE LESION;  Surgeon: Bud Face, MD;  Location: [facility] ORS;  Service: ENT;  Laterality: N/A;  . EYE SURGERY     cataracts  . FOOT SURGERY    . NECK SURGERY    . SHOULDER ARTHROSCOPY WITH OPEN ROTATOR CUFF REPAIR Left 01/16/2015   Procedure: SHOULDER ARTHROSCOPY WITH mini OPEN ROTATOR CUFF REPAIR,subacromial decompression, abrasion chondrop;asty with microfracture of glenoid, excision distal clavicle, biceps tenodesis, extensive debridement;  Surgeon: Christena Flake, MD;  Location: [facility] ORS;  Service: Orthopedics;  Laterality: Left;  . TOTAL KNEE ARTHROPLASTY Left 07/26/2017   Procedure: TOTAL KNEE ARTHROPLASTY;  Surgeon: Christena Flake, MD;  Location: [facility] ORS;  Service: Orthopedics;  Laterality: Left;  Marland Kitchen VESICOVAGINAL FISTULA CLOSURE W/ TAH  Prior to Admission medications   Medication Sig Start Date End Date Taking? Authorizing Provider  aluminum hydroxide-magnesium carbonate (GAVISCON) 95-358 MG/15ML SUSP Take 15-30 mLs by mouth 3 (three) times daily as needed for indigestion or heartburn.     [provider]  amLODipine (NORVASC) 10 MG tablet Take 10 mg by mouth daily.     [provider]  Carboxymethylcellul-Glycerin  (CLEAR EYES FOR DRY EYES) 1-0.25 % SOLN Place 1 drop into both eyes 3 (three) times daily as needed (for dry eyes.).    [provider]  cycloSPORINE (RESTASIS) 0.05 % ophthalmic emulsion Place 1 drop into both eyes 2 (two) times daily.    [provider]  divalproex (DEPAKOTE ER) 500 MG 24 hr tablet Take 1,000 mg by mouth every evening.     [provider]  enoxaparin (LOVENOX) 40 MG/0.4ML injection Inject 0.4 mLs (40 mg total) into the skin daily. 07/29/17   Anson OregonMcGhee, James Lance, PA-C  esomeprazole (NEXIUM) 40 MG capsule Take 40 mg by mouth daily before breakfast.     [provider]  hydroxychloroquine (PLAQUENIL) 200 MG tablet Take 200 mg by mouth daily.  11/12/14   [provider]  lisinopril (PRINIVIL,ZESTRIL) 10 MG tablet Take 10 mg by mouth daily.    [provider]  lovastatin (MEVACOR) 10 MG tablet Take 10 mg by mouth daily.     [provider]  Multiple Vitamin (MULTIVITAMIN WITH MINERALS) TABS tablet Take 1 tablet by mouth daily.    [provider]  oxyCODONE (OXY IR/ROXICODONE) 5 MG immediate release tablet Take 1-2 tablets (5-10 mg total) by mouth every 4 (four) hours as needed for moderate pain. 07/29/17   Anson OregonMcGhee, James Lance, PA-C  QUEtiapine (SEROQUEL) 25 MG tablet Take 50 mg by mouth at bedtime.    [provider]  sertraline (ZOLOFT) 100 MG tablet Take 100 mg by mouth daily.     [provider]  sulindac (CLINORIL) 200 MG tablet Take 200 mg by mouth 2 (two) times daily.    [provider]  traMADol (ULTRAM) 50 MG tablet Take 1-2 tablets (50-100 mg total) by mouth every 6 (six) hours as needed for moderate pain. 07/29/17   Anson OregonMcGhee, James Lance, PA-C    Allergies  Allergen Reactions  . Codeine Itching  . Penicillin G Itching    Has patient had a PCN reaction causing immediate rash, facial/tongue/throat swelling, SOB or lightheadedness with hypotension: No Has patient had a PCN reaction  causing severe rash involving mucus membranes or skin necrosis: No Has patient had a PCN reaction that required hospitalization: No Has patient had a PCN reaction occurring within the last 10 years: No If all of the above answers are "NO", then may proceed with Cephalosporin use.   . Sulfa Antibiotics Itching    Family History  Problem Relation Age of Onset  . Diabetes Mother   . Stroke Mother   . COPD Father   . Tuberculosis Father   . Kidney disease Neg Hx   . Bladder Cancer Neg Hx     Social History Social History   Tobacco Use  . Smoking status: Former Smoker    Quit date: 01/13/1965    Years since quitting: 53.9  . Smokeless tobacco: Never Used  Substance Use Topics  . Alcohol use: No    Alcohol/week: 0.0 standard drinks    Comment: occ  . Drug use: No    Review of Systems Constitutional: Negative for fever. Cardiovascular: Negative for  chest pain. Respiratory: Negative for shortness of breath. Gastrointestinal: Intermittent upper abdominal pain. Genitourinary: Negative for urinary compaints Musculoskeletal: Negative for musculoskeletal complaints Neurological: Negative for headache All other ROS negative  ____________________________________________   PHYSICAL EXAM:  VITAL SIGNS: ED Triage Vitals  Enc Vitals Group     BP 12/27/18 1126 123/60     Pulse Rate 12/27/18 1126 67     Resp 12/27/18 1126 20     Temp 12/27/18 1126 99.1 F (37.3 C)     Temp Source 12/27/18 1126 Oral     SpO2 12/27/18 1126 98 %     Weight 12/27/18 1127 214 lb (97.1 kg)     Height 12/27/18 1127 5\' 8"  (1.727 m)     Head Circumference --      Peak Flow --      Pain Score 12/27/18 1133 8     Pain Loc --      Pain Edu? --      Excl. in GC? --    Constitutional: Alert and oriented. Well appearing and in no distress. Eyes: Normal exam ENT      Head: Normocephalic and atraumatic.      Mouth/Throat: Mucous membranes are moist. Cardiovascular: Normal rate, regular rhythm.   Respiratory: Normal respiratory effort without tachypnea nor retractions. Breath sounds are clear  Gastrointestinal: Soft and nontender. No distention.   Musculoskeletal: Nontender with normal range of motion in all extremities. Neurologic:  Normal speech and language. No gross focal neurologic deficits  Skin:  Skin is warm, dry and intact.  Psychiatric: Mood and affect are normal.   ____________________________________________    EKG  EKG viewed and interpreted by myself shows sinus bradycardia 56 bpm with a narrow QRS, normal axis, normal intervals, no concerning ST changes.  ____________________________________________    RADIOLOGY  CT negative for acute abnormality.  ____________________________________________   INITIAL IMPRESSION / ASSESSMENT AND PLAN / ED COURSE  Pertinent labs & imaging results that were available during my care of the patient were reviewed by me and considered in my medical decision making (see chart for details).   Patient presents to the emergency department for intermittent left upper quadrant/left flank pain.  States the pain has been intermittent ongoing over the past 3 weeks, no pain currently, benign abdominal exam.  Overall the patient appears well.  Patient's labs are reassuring including a normal white blood cell count, normal LFTs and lipase.  Urinalysis is pending.  Given the patient's intermittent pain x3 weeks we will proceed with CT imaging of the abdomen to further evaluate.  Patient agreeable to plan of care.  CT scan negative, urinalysis reassuring.  Overall the patient appears well.  She has a GI specialist that she follows up with already.  Patient will call for an appointment for further evaluation.  We will discharge on Bentyl, patient already on a PPI.  DEVI HOPMAN was evaluated in Emergency Department on 12/27/2018 for the symptoms described in the history of present illness. She was evaluated in the context of the global  COVID-19 pandemic, which necessitated consideration that the patient might be at risk for infection with the SARS-CoV-2 virus that causes COVID-19. Institutional protocols and algorithms that pertain to the evaluation of patients at risk for COVID-19 are in a state of rapid change based on information released by regulatory bodies including the CDC and federal and state organizations. These policies and algorithms were followed during the patient's care in the ED.  ____________________________________________   FINAL CLINICAL IMPRESSION(S) /  ED DIAGNOSES  Left flank pain Abdominal pain   Harvest Dark, MD 12/27/18 [phone]

## 2018-12-27 NOTE — ED Notes (Signed)
MD at bedside. 

## 2018-12-27 NOTE — ED Notes (Signed)
Warm blanket applied per pt request.

## 2018-12-27 NOTE — ED Notes (Signed)
Signature pad not working, pt verbalized ok for discharge

## 2018-12-27 NOTE — ED Triage Notes (Signed)
Pt here for pain under left rib cage that has been present x 3 weeks.  Pt saw pcp and was dx with diverticulitis based on exam.  Pt has finished the abx for that but pain still present. Nausea with eating.  Pain radiates from LUQ across to RUQ.  Pain worse after eating.  No diarrhea. No fever.

## 2018-12-27 NOTE — ED Notes (Signed)
Pt has returned from CT in NAD. Pt resting in bed at this time with treatment door closed.

## 2018-12-27 NOTE — ED Notes (Signed)
Patient transported to CT 

## 2019-04-17 ENCOUNTER — Other Ambulatory Visit: Payer: Self-pay | Admitting: Family Medicine

## 2019-04-17 DIAGNOSIS — Z9181 History of falling: Secondary | ICD-10-CM

## 2019-04-17 DIAGNOSIS — Z1231 Encounter for screening mammogram for malignant neoplasm of breast: Secondary | ICD-10-CM

## 2019-04-17 DIAGNOSIS — Z0000 Encounter for general adult medical examination without abnormal findings: Secondary | ICD-10-CM

## 2019-07-30 ENCOUNTER — Telehealth: Payer: Self-pay

## 2019-07-30 NOTE — Telephone Encounter (Signed)
Reviewed pt chart. I see that referral was placed to our office 07/26/19. Our referral coordinator is currently working on getting pt scheduled.

## 2019-07-30 NOTE — Telephone Encounter (Signed)
Pt left VM with office asking for a call back to schedule an appt with Dr. Epimenio Foot.

## 2019-10-04 ENCOUNTER — Ambulatory Visit (INDEPENDENT_AMBULATORY_CARE_PROVIDER_SITE_OTHER): Payer: Medicare Other | Admitting: Neurology

## 2019-10-04 ENCOUNTER — Encounter: Payer: Self-pay | Admitting: Neurology

## 2019-10-04 ENCOUNTER — Other Ambulatory Visit: Payer: Self-pay

## 2019-10-04 ENCOUNTER — Telehealth: Payer: Self-pay | Admitting: Neurology

## 2019-10-04 VITALS — BP 135/65 | HR 59 | Ht 68.0 in | Wt 217.0 lb

## 2019-10-04 DIAGNOSIS — R200 Anesthesia of skin: Secondary | ICD-10-CM

## 2019-10-04 DIAGNOSIS — R260 Ataxic gait: Secondary | ICD-10-CM

## 2019-10-04 DIAGNOSIS — M5412 Radiculopathy, cervical region: Secondary | ICD-10-CM | POA: Diagnosis not present

## 2019-10-04 DIAGNOSIS — R32 Unspecified urinary incontinence: Secondary | ICD-10-CM

## 2019-10-04 NOTE — Progress Notes (Signed)
[facility]  PATIENT: Jessica Norman DOB: [birth year]  REFERRING DOCTOR OR PCP:  Darreld Mclean, MD SOURCE: patient, notes from primary care, labs, imaging  _________________________________   HISTORICAL  CHIEF COMPLAINT:  Chief Complaint  Patient presents with  . New Patient (Initial Visit)    RM 13, with husband. Paper referral from Dr. Marvis Moeller for neuropathy management. Reports numb/ting in fingers, legs feel like it has neddles in them (worse in right). Has pain in back of neck when she lays head on pillow at night. She has hx of neck surgery. She has a clicking noise when she turns her head left. She feels very weak/tired. Neck surgery was around 2015.   . Gait Problem    Ambulates with cane. Has hx of knee replacement in left knee. Had fluid removed from right knee yesterday/injection, has a lot of inflammation in right knee.     HISTORY OF PRESENT ILLNESS:  I had the pleasure of seeing your patient, Jessica Norman, at [facility] for neurologic consultation regarding her reduced gait and balance.  She is a 69 year old woman who notes reduced gait.   She has reduced balance and strength.   She feels she wobbles when she stops.  She needs to use her arms to get out of a chair.  Around the house she walks without her cane but needs to use it outside the house.    She had one fall 06/2019 and feels she has done worse since then.   She landed on her buttock and her head hit the cabinet.   Initially, she had trouble getting up but her husband was able to get her up.   The right leg seems weaker but she notes an arthritic knee.  Yesterday, she had arthrocentesis and Synvisc injection.   Her left knee has a TKR.  If right knee does not improve, she may need surgery.    She notes numbness in her legs, worse in the bottom of her feet.   Numbness extends proximally to above the ankles.  Additionally, she has numbness with some discomfort that radiates from the  neck into the right arm.  Sometimes the hand seems numb.  She has had urinary incontinence since earlier this year,a little worse after her fall.  At bedtime while lying down, she notes twitches in her face and arms.    I had previously seen her in 2017 for memory loss.   She also had reported some leg weakness and reduced gait but was doing better in 2018 when last seen.  She continues to note some memory loss, mildly worse compared to 2018.  MRI brain 09/20/2015 showed T2/FLAIR hyperintense foci in the white matter of the hemispheres in the pons, essentially unchanged when compared to the 10/10/2013 MRI. This most likely represents chronic microvascular ischemic change, more than expected for age. Demyelination would be less likely to give this pattern.   Moderate cortical atrophy, more than expected for age and increased compared to the 10/10/2013 MRI.  MRI cervical spine from 06/23/2012 shows fusion at C3-C5 and mild DDD but no spinal cord pathology or significant spinal stenosis.   Vascular risk factors:   She has difficult to control BP and is on 3 med's.   She has not been a smoker.   She does not have DM.    REVIEW OF SYSTEMS: Constitutional: No fevers, chills, sweats, or change in appetite Eyes: No visual changes, double vision, eye pain Ear, nose  and throat: No hearing loss, ear pain, nasal congestion, sore throat Cardiovascular: No chest pain, palpitations Respiratory: No shortness of breath at rest or with exertion.   No wheezes GastrointestinaI: No nausea, vomiting, diarrhea, abdominal pain, fecal incontinence Genitourinary: No dysuria, urinary retention or frequency.  No nocturia. Musculoskeletal: No neck pain, back pain Integumentary: No rash, pruritus, skin lesions Neurological: as above Psychiatric: No depression at this time.  No anxiety Endocrine: No palpitations, diaphoresis, change in appetite, change in weigh or increased thirst Hematologic/Lymphatic: No anemia,  purpura, petechiae. Allergic/Immunologic: No itchy/runny eyes, nasal congestion, recent allergic reactions, rashes  ALLERGIES: Allergies  Allergen Reactions  . Codeine Itching  . Penicillin G Itching    Has patient had a PCN reaction causing immediate rash, facial/tongue/throat swelling, SOB or lightheadedness with hypotension: No Has patient had a PCN reaction causing severe rash involving mucus membranes or skin necrosis: No Has patient had a PCN reaction that required hospitalization: No Has patient had a PCN reaction occurring within the last 10 years: No If all of the above answers are "NO", then may proceed with Cephalosporin use.   . Sulfa Antibiotics Itching    HOME MEDICATIONS:  Current Outpatient Medications:  .  aluminum hydroxide-magnesium carbonate (GAVISCON) 95-358 MG/15ML SUSP, Take 15-30 mLs by mouth in the morning and at bedtime. , Disp: , Rfl:  .  amLODipine (NORVASC) 10 MG tablet, Take 10 mg by mouth daily. , Disp: , Rfl:  .  ASPIRIN 81 PO, Take 1 tablet by mouth daily., Disp: , Rfl:  .  carvedilol (COREG) 6.25 MG tablet, Take 6.25 mg by mouth daily., Disp: , Rfl:  .  cycloSPORINE (RESTASIS) 0.05 % ophthalmic emulsion, Place 1 drop into both eyes 2 (two) times daily., Disp: , Rfl:  .  diclofenac (VOLTAREN) 0.1 % ophthalmic solution, 2 drops 4 (four) times daily., Disp: , Rfl:  .  divalproex (DEPAKOTE ER) 500 MG 24 hr tablet, Take 1,000 mg by mouth daily. , Disp: , Rfl:  .  esomeprazole (NEXIUM) 40 MG capsule, Take 40 mg by mouth daily before breakfast. , Disp: , Rfl:  .  fexofenadine (ALLEGRA) 180 MG tablet, Take 180 mg by mouth daily., Disp: , Rfl:  .  hydroxychloroquine (PLAQUENIL) 200 MG tablet, Take 200 mg by mouth daily. , Disp: , Rfl:  .  levothyroxine (SYNTHROID) 50 MCG tablet, Take 50 mcg by mouth daily before breakfast., Disp: , Rfl:  .  lisinopril (PRINIVIL,ZESTRIL) 10 MG tablet, Take 10 mg by mouth daily., Disp: , Rfl:  .  lovastatin (MEVACOR) 10 MG  tablet, Take 10 mg by mouth daily. , Disp: , Rfl:  .  Multiple Vitamin (MULTIVITAMIN WITH MINERALS) TABS tablet, Take 1 tablet by mouth daily., Disp: , Rfl:  .  QUEtiapine (SEROQUEL) 25 MG tablet, Take 50 mg by mouth at bedtime., Disp: , Rfl:  .  sertraline (ZOLOFT) 100 MG tablet, Take 100 mg by mouth daily. , Disp: , Rfl:  .  sulindac (CLINORIL) 200 MG tablet, Take 200 mg by mouth 2 (two) times daily., Disp: , Rfl:   PAST MEDICAL HISTORY: Past Medical History:  Diagnosis Date  . Arthritis   . Asthma   . Bipolar disorder (HCC)   . GERD (gastroesophageal reflux disease)   . Hearing loss   . History of hiatal hernia 2011   treated with medication, no surgical repair  . History of Sjogren's disease (HCC)   . HLD (hyperlipidemia)   . Hypertension   . Hypothyroidism   .  Incomplete bladder emptying   . Memory loss   . Obesity   . Recurrent UTI   . Vision abnormalities     PAST SURGICAL HISTORY: Past Surgical History:  Procedure Laterality Date  . ABDOMINAL HYSTERECTOMY    . BACK SURGERY    . CARPAL TUNNEL RELEASE Right   . EXCISION OF TONGUE LESION N/A 12/19/2014   Procedure: EXCISION OF TONGUE LESION;  Surgeon: Bud Face, MD;  Location: [facility] ORS;  Service: ENT;  Laterality: N/A;  . EYE SURGERY     cataracts  . FOOT SURGERY    . NECK SURGERY    . SHOULDER ARTHROSCOPY WITH OPEN ROTATOR CUFF REPAIR Left 01/16/2015   Procedure: SHOULDER ARTHROSCOPY WITH mini OPEN ROTATOR CUFF REPAIR,subacromial decompression, abrasion chondrop;asty with microfracture of glenoid, excision distal clavicle, biceps tenodesis, extensive debridement;  Surgeon: Christena Flake, MD;  Location: [facility] OR[facility];  Service: Orthopedics;  Laterality: Left;  . TOTAL KNEE ARTHROPLASTY Left 07/26/2017   Procedure: TOTAL KNEE ARTHROPLASTY;  Surgeon: Christena Flake, MD;  Location: [facility] OR[facility];  Service: Orthopedics;  Laterality: Left;  Marland Kitchen VESICOVAGINAL FISTULA CLOSURE W/ TAH      FAMILY HISTORY: Family History  Problem  Relation Age of Onset  . Diabetes Mother   . Stroke Mother   . COPD Father   . Tuberculosis Father   . Kidney disease Neg Hx   . Bladder Cancer Neg Hx     SOCIAL HISTORY:  Social History   Socioeconomic History  . Marital status: Married    Spouse name: Not on file  . Number of children: 2  . Years of education: college  . Highest education level: Not on file  Occupational History  . Occupation: Retired   Tobacco Use  . Smoking status: Former Smoker    Quit date: 01/13/1965    Years since quitting: 54.7  . Smokeless tobacco: Never Used  Vaping Use  . Vaping Use: Never used  Substance and Sexual Activity  . Alcohol use: No    Alcohol/week: 0.0 standard drinks    Comment: occ  . Drug use: No  . Sexual activity: Not on file  Other Topics Concern  . Not on file  Social History Narrative   Caffeine use: 1 soda per day, 1 coffee per day   Right handed    Lives with husband   Social Determinants of Health   Financial Resource Strain:   . Difficulty of Paying Living Expenses: Not on file  Food Insecurity:   . Worried About Programme researcher, broadcasting/film/video in the Last Year: Not on file  . Ran Out of Food in the Last Year: Not on file  Transportation Needs:   . Lack of Transportation (Medical): Not on file  . Lack of Transportation (Non-Medical): Not on file  Physical Activity:   . Days of Exercise per Week: Not on file  . Minutes of Exercise per Session: Not on file  Stress:   . Feeling of Stress : Not on file  Social Connections:   . Frequency of Communication with Friends and Family: Not on file  . Frequency of Social Gatherings with Friends and Family: Not on file  . Attends Religious Services: Not on file  . Active Member of Clubs or Organizations: Not on file  . Attends Banker Meetings: Not on file  . Marital Status: Not on file  Intimate Partner Violence:   . Fear of Current or Ex-Partner: Not on file  . Emotionally Abused: Not  on file  . Physically  Abused: Not on file  . Sexually Abused: Not on file     PHYSICAL EXAM  Vitals:   10/04/19 0951  BP: 135/65  Pulse: (!) 59  Weight: 217 lb (98.4 kg)  Height: 5\' 8"  (1.727 m)    Body mass index is 32.99 kg/m.   General: The patient is well-developed and well-nourished and in no acute distress  HEENT:  Head is [place]/AT.  Sclera are anicteric.   Neck: No carotid bruits are noted.  The neck is tender over paraspinal muscles and occiput   Good ROM  Cardiovascular: The heart has a regular rate and rhythm with a normal S1 and S2. There were no murmurs, gallops or rubs.    Skin: Extremities are without rash or  edema.  Musculoskeletal:  Back is nontender  Neurologic Exam  Mental status: The patient is alert and oriented x 3 at the time of the examination. The patient has apparent normal recent and remote memory, with an apparently normal attention span and concentration ability.   Speech is normal.  Cranial nerves: Extraocular movements are full.  Pupils are equal, round, and reactive to light and accomodation.    Facial symmetry is present. There is good facial sensation to soft touch bilaterally.Facial strength is normal.  Trapezius and sternocleidomastoid strength is normal. No dysarthria is noted.    Reduced hearing on her left.   Weber lateralizes to the right.  Motor:  Muscle bulk is normal.   Tone is normal. Strength is  4+/5 right triceps, 4++ left triceps, 4-/5 right iliopsoas, 4+/5 left iliopsoas but 5/5 ankle/toe extension.  .   Sensory: Sensory testing shows mildly reduced sensation in C7 distribution of right hand.   In legs has reduced temp/pp and vib in left leg/foot relative to right.  Coordination: Cerebellar testing reveals good right but mildly reduced left finger-nose-finger and reduced heel-to-shin worse on right.  Gait and station: Station is normal.   Gait is wide with less support on right leg, turn unstable. Cannot tandem. Romberg is borderline. .   Reflexes:  Deep tendon reflexes are symmetric and normal in arms but reduced in legs.   Plantar responses are flexor.    DIAGNOSTIC DATA (LABS, IMAGING, TESTING) - I reviewed patient records, labs, notes, testing and imaging myself where available.  Lab Results  Component Value Date   WBC 4.3 12/27/2018   HGB 12.1 12/27/2018   HCT 37.0 12/27/2018   MCV 88.1 12/27/2018   PLT 163 12/27/2018      Component Value Date/Time   NA 138 12/27/2018 1127   NA 143 05/22/2015 1033   K 4.1 12/27/2018 1127   CL 102 12/27/2018 1127   CO2 27 12/27/2018 1127   GLUCOSE 84 12/27/2018 1127   BUN 20 12/27/2018 1127   BUN 19 05/22/2015 1033   CREATININE 1.06 (H) 12/27/2018 1127   CALCIUM 9.2 12/27/2018 1127   PROT 7.3 12/27/2018 1127   PROT 7.0 11/25/2014 1116   ALBUMIN 3.4 (L) 12/27/2018 1127   ALBUMIN 3.8 11/25/2014 1116   AST 20 12/27/2018 1127   ALT 12 12/27/2018 1127   ALKPHOS 39 12/27/2018 1127   BILITOT 0.4 12/27/2018 1127   BILITOT 0.3 11/25/2014 1116   GFRNONAA 54 (L) 12/27/2018 1127   GFRAA >60 12/27/2018 1127    Lab Results  Component Value Date   VITAMINB12 902 11/25/2014   Lab Results  Component Value Date   TSH 2.490 08/28/2015  ASSESSMENT AND PLAN  Ataxic gait - Plan: MR BRAIN WO CONTRAST, MR CERVICAL SPINE WO CONTRAST, Ambulatory referral to Physical Therapy  Cervical radiculopathy at C7 - Plan: MR CERVICAL SPINE WO CONTRAST  Urinary incontinence, unspecified type - Plan: MR BRAIN WO CONTRAST, MR CERVICAL SPINE WO CONTRAST  Numbness - Plan: MR BRAIN WO CONTRAST, MR CERVICAL SPINE WO CONTRAST  In summary, Ms. Rayon is a 69 year old woman who has had worsening gait and balance over the past 3 to 4 months.  She also has neck and right arm discomfort.  She has several known disorders that could contribute to poor gait.  MRI of the brain in the past had shown more than expected chronic microvascular ischemic changes and atrophy.  She also has arthritic changes in her  knees.  However, because of the worsening over the past few months we need to also be concerned of an intracranial process such as normal pressure hydrocephalus, subdural hematomas or strokes.  We also need to be concerned about cervical myelopathy.  Therefore, we will check an MRI of the brain and cervical spine to further evaluate the gait and determine if there is a structural cause of her problems.  Depending on the results she may need referral for intervention.  I will have her start physical therapy in the hope that that can help her gait more.  We discussed using a walker for safety.  She will return to see me in 3 months but sooner if needed based on the results of the studies.  She should call if she has new or worsening neurologic symptoms.  Thank you for asking me to see Ms. Gatti.  Please let me know if I can be of further assistance with her or other patients in the future.   Jennings Corado A. Epimenio Foot, MD, [facility] 10/04/2019, 10:21 AM Certified in Neurology, Clinical Neurophysiology, Sleep Medicine and Neuroimaging  [facility] [address], [address] [place], [place] [address] [phone]

## 2019-10-04 NOTE — Telephone Encounter (Signed)
Medicare/[place] order sent to GI. No auth they will reach out to the patient to schedule.  

## 2019-10-07 ENCOUNTER — Other Ambulatory Visit: Payer: Self-pay

## 2019-10-07 ENCOUNTER — Ambulatory Visit
Admission: RE | Admit: 2019-10-07 | Discharge: 2019-10-07 | Disposition: A | Discharge status: Home / Self Care | Payer: Medicare Other | Source: Ambulatory Visit | Attending: Neurology | Admitting: Neurology

## 2019-10-07 DIAGNOSIS — R32 Unspecified urinary incontinence: Secondary | ICD-10-CM

## 2019-10-07 DIAGNOSIS — M5412 Radiculopathy, cervical region: Secondary | ICD-10-CM

## 2019-10-07 DIAGNOSIS — R260 Ataxic gait: Secondary | ICD-10-CM

## 2019-10-07 DIAGNOSIS — R200 Anesthesia of skin: Secondary | ICD-10-CM

## 2019-10-08 ENCOUNTER — Telehealth: Payer: Self-pay | Admitting: *Deleted

## 2019-10-08 NOTE — Telephone Encounter (Signed)
Called and spoke with pt about results per Dr. Epimenio Foot note. She verbalized understanding. She will try PT. She will call if she has any new or worsening sx prior to next f/u.

## 2019-10-08 NOTE — Telephone Encounter (Signed)
-----   Message from Asa Lente, MD sent at 10/08/2019  6:00 PM EDT ----- Please let her know that the MRI of the brain looked the same as 2017.  The MRI of the cervical spine does show her previous surgery and some arthritis and disc changes in the neck that could involve the left C6 nerve root but this looks about the same as the previous MRI from 2014

## 2019-11-14 ENCOUNTER — Encounter: Payer: Self-pay | Admitting: Physical Therapy

## 2019-11-14 ENCOUNTER — Ambulatory Visit: Payer: Medicare Other | Attending: Neurology | Admitting: Physical Therapy

## 2019-11-14 ENCOUNTER — Other Ambulatory Visit: Payer: Self-pay

## 2019-11-14 DIAGNOSIS — R2689 Other abnormalities of gait and mobility: Secondary | ICD-10-CM | POA: Insufficient documentation

## 2019-11-14 DIAGNOSIS — M6281 Muscle weakness (generalized): Secondary | ICD-10-CM | POA: Diagnosis present

## 2019-11-14 NOTE — Therapy (Signed)
[facility] MAIN [facility] SERVICES [address] [place], [place], [address] Phone: [phone]   Fax:  [phone]  Physical Therapy Evaluation  Patient Details  Name: Jessica Norman MRN: [id number] Date of Birth: Feb 17, 1950 Referring Provider (PT): Despina Arias   Encounter Date: 11/14/2019   PT End of Session - 11/14/19 0855    Visit Number 1    Number of Visits 17    Date for PT Re-Evaluation 01/09/20    PT Start Time 0845    PT Stop Time 0945    PT Time Calculation (min) 60 min    Equipment Utilized During Treatment Gait belt    Activity Tolerance Patient tolerated treatment well    Behavior During Therapy [facility] for tasks assessed/performed           Past Medical History:  Diagnosis Date  . Arthritis   . Asthma   . Bipolar disorder (HCC)   . GERD (gastroesophageal reflux disease)   . Hearing loss   . History of hiatal hernia 2011   treated with medication, no surgical repair  . History of Sjogren's disease (HCC)   . HLD (hyperlipidemia)   . Hypertension   . Hypothyroidism   . Incomplete bladder emptying   . Memory loss   . Obesity   . Recurrent UTI   . Vision abnormalities     Past Surgical History:  Procedure Laterality Date  . ABDOMINAL HYSTERECTOMY    . BACK SURGERY    . CARPAL TUNNEL RELEASE Right   . EXCISION OF TONGUE LESION N/A 12/19/2014   Procedure: EXCISION OF TONGUE LESION;  Surgeon: Bud Face, MD;  Location: [facility] ORS;  Service: ENT;  Laterality: N/A;  . EYE SURGERY     cataracts  . FOOT SURGERY    . NECK SURGERY    . SHOULDER ARTHROSCOPY WITH OPEN ROTATOR CUFF REPAIR Left 01/16/2015   Procedure: SHOULDER ARTHROSCOPY WITH mini OPEN ROTATOR CUFF REPAIR,subacromial decompression, abrasion chondrop;asty with microfracture of glenoid, excision distal clavicle, biceps tenodesis, extensive debridement;  Surgeon: Christena Flake, MD;  Location: [facility] ORS;  Service: Orthopedics;  Laterality: Left;  . TOTAL KNEE  ARTHROPLASTY Left 07/26/2017   Procedure: TOTAL KNEE ARTHROPLASTY;  Surgeon: Christena Flake, MD;  Location: [facility] ORS;  Service: Orthopedics;  Laterality: Left;  Marland Kitchen VESICOVAGINAL FISTULA CLOSURE W/ TAH      There were no vitals filed for this visit.    Subjective Assessment - 11/14/19 0857    Subjective Patient reports that she is unsteady with her walking. She has pain in her knees TKR 2019.    Pertinent History per MD note:She is a 69 year old woman who notes reduced gait.   She has reduced balance and strength.   She feels she wobbles when she stops.  She needs to use her arms to get out of a chair.  Around the house she walks without her cane but needs to use it outside the house.    She had one fall 06/2019 and feels she has done worse since then.   She landed on her buttock and her head hit the cabinet.   Initially, she had trouble getting up but her husband was able to get her up.   The right leg seems weaker but she notes an arthritic knee.  Yesterday, she had arthrocentesis and Synvisc injection.   Her left knee has a TKR.  If right knee does not improve, she may need surgery.  She notes numbness in her legs, worse in the bottom of her feet.   Numbness extends proximally to above the ankles.  Additionally, she has numbness with some discomfort that radiates from the neck into the right arm.  Sometimes the hand seems numb. She has had urinary incontinence since earlier this year,a little worse after her fall.  At bedtime while lying down, she notes twitches in her face and arms.              [facility] PT Assessment - 11/14/19 0859      Assessment   Medical Diagnosis ataxia    Referring Provider (PT) Sater Richard    Onset Date/Surgical Date 10/04/19    Hand Dominance Right    Prior Therapy no      Precautions   Precautions Fall      Restrictions   Weight Bearing Restrictions No      Balance Screen   Has the patient fallen in the past 6 months Yes    How many times? 1    Has the  patient had a decrease in activity level because of a fear of falling?  Yes    Is the patient reluctant to leave their home because of a fear of falling?  No      Home Tourist information centre manager residence    Living Arrangements Spouse/significant other    Available Help at Discharge Family    Type of Home House    Home Access Stairs to enter    Entrance Stairs-Number of Steps 4    Entrance Stairs-Rails Right;Left    Home Layout One level    Home Equipment Walker - 2 wheels;Cane - single point;Bedside commode      Prior Function   Level of Independence Independent with household mobility without device;Independent with community mobility with device    Vocation Retired    Leisure 4 wheelers, grandchildren      Cognition   Overall Cognitive Status Within Functional Limits for tasks assessed      Balance   Balance Assessed No      Standardized Balance Assessment   Standardized Balance Assessment Secretary/administrator Test   Sit to Stand Able to stand  independently using hands    Standing Unsupported Able to stand safely 2 minutes    Sitting with Back Unsupported but Feet Supported on Floor or Stool Able to sit safely and securely 2 minutes    Stand to Sit Sits safely with minimal use of hands    Transfers Able to transfer safely, minor use of hands    Standing Unsupported with Eyes Closed Able to stand 10 seconds safely    Standing Unsupported with Feet Together Able to place feet together independently and stand 1 minute safely    From Standing, Reach Forward with Outstretched Arm Can reach forward >5 cm safely (2")    From Standing Position, Pick up Object from Floor Able to pick up shoe safely and easily    From Standing Position, Turn to Look Behind Over each Shoulder Looks behind one side only/other side shows less weight shift    Turn 360 Degrees Able to turn 360 degrees safely in 4 seconds or less    Standing Unsupported, Alternately Place Feet  on Step/Stool Able to stand independently and complete 8 steps >20 seconds    Standing Unsupported, One Foot in Front Needs help to step but can hold 15 seconds  Standing on One Leg Unable to try or needs assist to prevent fall    Total Score 44             PAIN:0/10 B knee POSTURE: wNL   PROM/AROM: PROM BUE:WNL AROM BUEWNL PROM BLE: WNL AROM BLE: WNL  STRENGTH:  Graded on a 0-5 scale Muscle Group Left Right                          Hip Flex 4/5 4/5  Hip Abd 3/5 3/5  Hip Add 2/5 2/5  Hip Ext -3/5 -3/5      Knee Flex 4/5 4/5  Knee Ext 4/5 4/5  Ankle DF 4/5 4/5  Ankle PF 3/5 3/5   SENSATION:  BUE : WNL BLE : WNL  NEUROLOGICAL SCREEN: (2+ unless otherwise noted.) N=normal  Ab=abnormal   Level Dermatome R L  C3 Anterior Neck  N N  C4 Top of Shoulder N N  C5 Lateral Upper Arm  N N  C6 Lateral Arm/ Thumb  N N  C7 Middle Finger  N N  C8 4th & 5th Finger N N  T1 Medial Arm N N  L2 Medial thigh/groin N N  L3 Lower thigh/med.knee N N  L4 Medial leg/lat thigh N N  L5 Lat. leg & dorsal foot N N  S1 post/lat foot/thigh/leg N N  S2 Post./med. thigh & leg N N    SOMATOSENSORY:  Any N & T in extremities or weakness: reports :         Sensation           Intact      Diminished         Absent  Light touch LEs                              COORDINATION: Finger to Nose: WNL  Shin to knee test: WNL  FUNCTIONAL MOBILITY:Needs BUE assist for sit to stand    BALANCE: berg balance 44/56   GAIT:Patient has shuffling gait with narrow base of support and decreased gait speed  OUTCOME MEASURES: TEST Outcome Interpretation  5 times sit<>stand 26.62 sec >60 yo, >15 sec indicates increased risk for falls  10 meter walk test     .81             m/s <1.0 m/s indicates increased risk for falls; limited community ambulator  Timed up and Go      16.37           sec <14 sec indicates increased risk for falls  6 minute walk test   685             Feet 1000 feet is  community Forensic scientist Assessment 44/56  <36/56 (100% risk for falls), 37-45 (80% risk for falls); 46-51 (>50% risk for falls); 52-55 (lower risk <25% of falls)                 Objective measurements completed on examination: See above findings.               PT Education - 11/14/19 0854    Education Details POC    Person(s) Educated Patient    Methods Explanation    Comprehension Verbalized understanding            PT Short Term Goals - 11/14/19 0953      PT  SHORT TERM GOAL #1   Title Patient will be independent in home exercise program to improve strength/mobility for better functional independence with ADLs.    Time 4    Period Weeks    Status New             PT Long Term Goals - 11/14/19 0953      PT LONG TERM GOAL #1   Title Patient (> 768 years old) will complete five times sit to stand test in < 15 seconds indicating an increased LE strength and improved balance.    Baseline 11/14/19= 26.62 SEC    Time 8    Period Weeks    Status New    Target Date 01/09/20      PT LONG TERM GOAL #2   Title Patient will increase Berg Balance score by > 6 points to demonstrate decreased fall risk during functional activities.    Baseline 11/14/19= 44/56    Time 8    Period Weeks    Status New    Target Date 01/09/20      PT LONG TERM GOAL #3   Title Patient will increase six minute walk test distance to >1000 for progression to community ambulator and improve gait ability    Baseline 11/14/19= 685 FT    Time 8    Period Weeks    Status New    Target Date 01/09/20      PT LONG TERM GOAL #4   Title Patient will increase 10 meter walk test to >1.6853m/s as to improve gait speed for better community ambulation and to reduce fall risk.    Baseline 11/14/19= . 81 M/SEC    Time 8    Period Weeks    Status New    Target Date 01/09/20      PT LONG TERM GOAL #5   Title Patient will reduce timed up and go to <11 seconds to reduce fall risk and  demonstrate improved transfer/gait ability.    Baseline 11/14/19= 16.37 SEC    Time 8    Period Weeks    Status New    Target Date 01/09/20                  Plan - 11/14/19 0856    Clinical Impression Statement Patient presents with decreased gait speed, decreased balance, and decreased BLE strength. Patient's main complaint is BLE weakness and inability to participate in desired activities. Patient wants to improve  balance and ability to ambulate on  surfaces safely. Patient will benefit from skilled PT in order to increase gait speed, increase BLE strength, and improve dynamic standing balance to decrease risk for falls and enable patient to participate in desired activities.   Personal Factors and Comorbidities Age    Stability/Clinical Decision Making Stable/Uncomplicated    Clinical Decision Making Low    Rehab Potential Good    PT Frequency 2x / week    PT Duration 8 weeks    PT Treatment/Interventions Gait training;Balance training;Therapeutic exercise;Neuromuscular re-education;Patient/family education;Stair training;Therapeutic activities;Dry needling;Functional mobility training    PT Next Visit Plan strength and balance    Consulted and Agree with Plan of Care Patient           Patient will benefit from skilled therapeutic intervention in order to improve the following deficits and impairments:  Abnormal gait, Decreased activity tolerance, Decreased strength, Pain, Impaired flexibility, Decreased balance, Decreased mobility, Difficulty walking  Visit Diagnosis: Other abnormalities of gait and mobility  Muscle  weakness (generalized)     Problem List Patient Active Problem List   Diagnosis Date Noted  . Status post total knee replacement using cement, left 07/26/2017  . Gait disturbance 05/25/2016  . Abnormal brain MRI 05/25/2016  . Memory loss 08/28/2015  . Incontinence 05/06/2015  . Numbness 11/25/2014  . Insomnia 08/01/2014  . Abdominal pain, left  upper quadrant 07/16/2014  . Abnormal finding on ultrasound 07/16/2014  . Fatty liver disease, nonalcoholic 07/16/2014  . Small intestinal bacterial overgrowth 07/16/2014  . Asthma without status asthmaticus 06/20/2014  . HLD (hyperlipidemia) 06/20/2014  . Dermatitis, eczematoid 06/20/2014  . Acid reflux 06/20/2014  . Cephalalgia 06/20/2014  . BP (high blood pressure) 06/20/2014  . Adiposity 06/20/2014  . Arthritis, degenerative 06/20/2014  . Allergic rhinitis, seasonal 06/20/2014  . Proximal limb weakness 06/20/2014  . Proximal leg weakness 06/20/2014  . Disturbed cognition 06/20/2014  . Carpal tunnel syndrome 06/20/2014  . Tinnitus 06/20/2014    Jones Broom S,PT DPT 11/14/2019, 10:15 AM  [place] [facility] MAIN [facility] SERVICES [address] [place], [place], [address] Phone: [phone]   Fax:  [phone]  Name: XOCHILTH STANDISH MRN: [id number] Date of Birth: 09/17/1950

## 2019-11-19 ENCOUNTER — Other Ambulatory Visit: Payer: Self-pay

## 2019-11-19 ENCOUNTER — Encounter: Payer: Self-pay | Admitting: Physical Therapy

## 2019-11-19 ENCOUNTER — Ambulatory Visit: Payer: Medicare Other | Attending: Neurology | Admitting: Physical Therapy

## 2019-11-19 DIAGNOSIS — R2689 Other abnormalities of gait and mobility: Secondary | ICD-10-CM | POA: Diagnosis present

## 2019-11-19 DIAGNOSIS — M6281 Muscle weakness (generalized): Secondary | ICD-10-CM | POA: Insufficient documentation

## 2019-11-19 NOTE — Therapy (Signed)
[place] [facility] MAIN [facility] SERVICES [address] [place], [place], [address] Phone: [phone]   Fax:  [phone]  Physical Therapy Treatment  Patient Details  Name: Jessica Norman MRN: [id number] Date of Birth: 1950/03/08 Referring Provider (PT): Despina Arias   Encounter Date: 11/19/2019   PT End of Session - 11/19/19 0953    Visit Number 2    Number of Visits 17    Date for PT Re-Evaluation 01/09/20    PT Start Time 0950    PT Stop Time 1015    PT Time Calculation (min) 25 min           Past Medical History:  Diagnosis Date  . Arthritis   . Asthma   . Bipolar disorder (HCC)   . GERD (gastroesophageal reflux disease)   . Hearing loss   . History of hiatal hernia 2011   treated with medication, no surgical repair  . History of Sjogren's disease (HCC)   . HLD (hyperlipidemia)   . Hypertension   . Hypothyroidism   . Incomplete bladder emptying   . Memory loss   . Obesity   . Recurrent UTI   . Vision abnormalities     Past Surgical History:  Procedure Laterality Date  . ABDOMINAL HYSTERECTOMY    . BACK SURGERY    . CARPAL TUNNEL RELEASE Right   . EXCISION OF TONGUE LESION N/A 12/19/2014   Procedure: EXCISION OF TONGUE LESION;  Surgeon: Bud Face, MD;  Location: [facility] ORS;  Service: ENT;  Laterality: N/A;  . EYE SURGERY     cataracts  . FOOT SURGERY    . NECK SURGERY    . SHOULDER ARTHROSCOPY WITH OPEN ROTATOR CUFF REPAIR Left 01/16/2015   Procedure: SHOULDER ARTHROSCOPY WITH mini OPEN ROTATOR CUFF REPAIR,subacromial decompression, abrasion chondrop;asty with microfracture of glenoid, excision distal clavicle, biceps tenodesis, extensive debridement;  Surgeon: Christena Flake, MD;  Location: [facility] ORS;  Service: Orthopedics;  Laterality: Left;  . TOTAL KNEE ARTHROPLASTY Left 07/26/2017   Procedure: TOTAL KNEE ARTHROPLASTY;  Surgeon: Christena Flake, MD;  Location: [facility] ORS;  Service: Orthopedics;  Laterality: Left;  Marland Kitchen  VESICOVAGINAL FISTULA CLOSURE W/ TAH      There were no vitals filed for this visit.   Subjective Assessment - 11/19/19 1013    Currently in Pain? Yes    Pain Score 3     Pain Location Knee    Pain Orientation Right    Pain Descriptors / Indicators Aching    Pain Type Chronic pain    Pain Radiating Towards lat thigh    Pain Onset More than a month ago    Pain Frequency Intermittent    Multiple Pain Sites No           Treatment: Neuromuscular Re-education  Heel/toe raises without UE support 3s hold x 10 each Stepping fwd/ bwd over one  Step over 1/2 foam fwd/bwd Backwards stepping from foam to 6 inch stool x 15 --R knee and RLE leg are hurting Standing on 1/2 foam flat side up x 30 sec x 3    Pt educated throughout session about proper posture and technique with exercises. Improved exercise technique, movement at target joints, use of target muscles after min to mod verbal, visual, tactile cues.                         PT Education - 11/19/19 0953    Education Details HEP  Person(s) Educated Patient    Methods Explanation    Comprehension Verbalized understanding            PT Short Term Goals - 11/14/19 0953      PT SHORT TERM GOAL #1   Title Patient will be independent in home exercise program to improve strength/mobility for better functional independence with ADLs.    Time 4    Period Weeks    Status New    Target Date 12/12/19             PT Long Term Goals - 11/14/19 0953      PT LONG TERM GOAL #1   Title Patient (> 20 years old) will complete five times sit to stand test in < 15 seconds indicating an increased LE strength and improved balance.    Baseline 11/14/19= 26.62 SEC    Time 8    Period Weeks    Status New    Target Date 01/09/20      PT LONG TERM GOAL #2   Title Patient will increase Berg Balance score by > 6 points to demonstrate decreased fall risk during functional activities.    Baseline 11/14/19= 44/56     Time 8    Period Weeks    Status New    Target Date 01/09/20      PT LONG TERM GOAL #3   Title Patient will increase six minute walk test distance to >1000 for progression to community ambulator and improve gait ability    Baseline 11/14/19= 685 FT    Time 8    Period Weeks    Status New    Target Date 01/09/20      PT LONG TERM GOAL #4   Title Patient will increase 10 meter walk test to >1.70m/s as to improve gait speed for better community ambulation and to reduce fall risk.    Baseline 11/14/19= . 81 M/SEC    Time 8    Period Weeks    Status New    Target Date 01/09/20      PT LONG TERM GOAL #5   Title Patient will reduce timed up and go to <11 seconds to reduce fall risk and demonstrate improved transfer/gait ability.    Baseline 11/14/19= 16.37 SEC    Time 8    Period Weeks    Status New    Target Date 01/09/20                 Plan - 11/19/19 0954    Clinical Impression Statement  Pt was able to perform all exercises today with CGA.Marland Kitchen Pt was able to perform all balance and strength exercises, demonstrating improvements in LE strength and stability.  Pt was able to complete dynamic balance exercises, showing ability to stand on even surfaces with min assist and improve postural reactions to correct self during activities.  Pt requires verbal, visual and tactile cues during exercise in order to complete tasks with proper form and technique, as well as to stay on task.  Pt would continue to benefit from skilled PT services in order to further strengthen LE's, improve static and dynamic balance, and improve coordination in order to increase functional mobility and decrease risk of falls   Personal Factors and Comorbidities Age    Stability/Clinical Decision Making Stable/Uncomplicated    Rehab Potential Good    PT Frequency 2x / week    PT Duration 8 weeks    PT Treatment/Interventions Gait training;Balance training;Therapeutic exercise;Neuromuscular  re-education;Patient/family education;Stair training;Therapeutic activities;Dry needling;Functional mobility training    PT Next Visit Plan strength and balance    Consulted and Agree with Plan of Care Patient           Patient will benefit from skilled therapeutic intervention in order to improve the following deficits and impairments:  Abnormal gait, Decreased activity tolerance, Decreased strength, Pain, Impaired flexibility, Decreased balance, Decreased mobility, Difficulty walking  Visit Diagnosis: Other abnormalities of gait and mobility  Muscle weakness (generalized)     Problem List Patient Active Problem List   Diagnosis Date Noted  . Status post total knee replacement using cement, left 07/26/2017  . Gait disturbance 05/25/2016  . Abnormal brain MRI 05/25/2016  . Memory loss 08/28/2015  . Incontinence 05/06/2015  . Numbness 11/25/2014  . Insomnia 08/01/2014  . Abdominal pain, left upper quadrant 07/16/2014  . Abnormal finding on ultrasound 07/16/2014  . Fatty liver disease, nonalcoholic 07/16/2014  . Small intestinal bacterial overgrowth 07/16/2014  . Asthma without status asthmaticus 06/20/2014  . HLD (hyperlipidemia) 06/20/2014  . Dermatitis, eczematoid 06/20/2014  . Acid reflux 06/20/2014  . Cephalalgia 06/20/2014  . BP (high blood pressure) 06/20/2014  . Adiposity 06/20/2014  . Arthritis, degenerative 06/20/2014  . Allergic rhinitis, seasonal 06/20/2014  . Proximal limb weakness 06/20/2014  . Proximal leg weakness 06/20/2014  . Disturbed cognition 06/20/2014  . Carpal tunnel syndrome 06/20/2014  . Tinnitus 06/20/2014    Ezekiel Ina, PT DPT 11/19/2019, 10:15 AM  [place] [facility] MAIN [facility] SERVICES [address] [place], [place], [address] Phone: [phone]   Fax:  [phone]  Name: Jessica Norman MRN: [id number] Date of Birth: 08/03/1950

## 2019-11-21 ENCOUNTER — Ambulatory Visit: Payer: Medicare Other | Admitting: Physical Therapy

## 2019-11-21 ENCOUNTER — Encounter: Payer: Self-pay | Admitting: Physical Therapy

## 2019-11-21 ENCOUNTER — Other Ambulatory Visit: Payer: Self-pay

## 2019-11-21 DIAGNOSIS — R2689 Other abnormalities of gait and mobility: Secondary | ICD-10-CM | POA: Diagnosis not present

## 2019-11-21 DIAGNOSIS — M6281 Muscle weakness (generalized): Secondary | ICD-10-CM

## 2019-11-21 NOTE — Therapy (Signed)
[place] [facility] MAIN [facility] SERVICES [address] [place], [place], [address] Phone: [phone]   Fax:  [phone]  Physical Therapy Treatment  Patient Details  Name: Jessica Norman MRN: [id number] Date of Birth: 06-17-1950 Referring Provider (PT): Despina Arias   Encounter Date: 11/21/2019   PT End of Session - 11/21/19 0854    Visit Number 3    Number of Visits 17    Date for PT Re-Evaluation 01/09/20    PT Start Time 0845    PT Stop Time 0925    PT Time Calculation (min) 40 min    Equipment Utilized During Treatment Gait belt    Activity Tolerance Patient tolerated treatment well    Behavior During Therapy [facility] for tasks assessed/performed           Past Medical History:  Diagnosis Date  . Arthritis   . Asthma   . Bipolar disorder (HCC)   . GERD (gastroesophageal reflux disease)   . Hearing loss   . History of hiatal hernia 2011   treated with medication, no surgical repair  . History of Sjogren's disease (HCC)   . HLD (hyperlipidemia)   . Hypertension   . Hypothyroidism   . Incomplete bladder emptying   . Memory loss   . Obesity   . Recurrent UTI   . Vision abnormalities     Past Surgical History:  Procedure Laterality Date  . ABDOMINAL HYSTERECTOMY    . BACK SURGERY    . CARPAL TUNNEL RELEASE Right   . EXCISION OF TONGUE LESION N/A 12/19/2014   Procedure: EXCISION OF TONGUE LESION;  Surgeon: Bud Face, MD;  Location: [facility] ORS;  Service: ENT;  Laterality: N/A;  . EYE SURGERY     cataracts  . FOOT SURGERY    . NECK SURGERY    . SHOULDER ARTHROSCOPY WITH OPEN ROTATOR CUFF REPAIR Left 01/16/2015   Procedure: SHOULDER ARTHROSCOPY WITH mini OPEN ROTATOR CUFF REPAIR,subacromial decompression, abrasion chondrop;asty with microfracture of glenoid, excision distal clavicle, biceps tenodesis, extensive debridement;  Surgeon: Christena Flake, MD;  Location: [facility] ORS;  Service: Orthopedics;  Laterality: Left;  . TOTAL KNEE  ARTHROPLASTY Left 07/26/2017   Procedure: TOTAL KNEE ARTHROPLASTY;  Surgeon: Christena Flake, MD;  Location: [facility] ORS;  Service: Orthopedics;  Laterality: Left;  Marland Kitchen VESICOVAGINAL FISTULA CLOSURE W/ TAH      There were no vitals filed for this visit.   Subjective Assessment - 11/21/19 0853    Subjective Patient reports that she is unsteady with her walking. She has pain in her knees TKR 2019.    Pertinent History per MD note:She is a 69 year old woman who notes reduced gait.   She has reduced balance and strength.   She feels she wobbles when she stops.  She needs to use her arms to get out of a chair.  Around the house she walks without her cane but needs to use it outside the house.    She had one fall 06/2019 and feels she has done worse since then.   She landed on her buttock and her head hit the cabinet.   Initially, she had trouble getting up but her husband was able to get her up.   The right leg seems weaker but she notes an arthritic knee.  Yesterday, she had arthrocentesis and Synvisc injection.   Her left knee has a TKR.  If right knee does not improve, she may need surgery.    She  notes numbness in her legs, worse in the bottom of her feet.   Numbness extends proximally to above the ankles.  Additionally, she has numbness with some discomfort that radiates from the neck into the right arm.  Sometimes the hand seems numb. She has had urinary incontinence since earlier this year,a little worse after her fall.  At bedtime while lying down, she notes twitches in her face and arms.    Currently in Pain? Yes    Pain Score 3     Pain Orientation Right    Pain Descriptors / Indicators Aching    Pain Type Chronic pain    Pain Onset More than a month ago             Ther-ex  Neuromuscular Re-education  Rocker board fwd/bwd, side to side x 20 each direction Mod tandem stand on without UE support x 2 lengths Side stepping blue balance without UE support x 2 lengths Heel/toe raises without UE  support 3s hold x 10 each 1/2 foam roll balance with flat side up 30s x 2 reps 1/2 foam roll balance with flat side down 30s x 2 reps 1/2 foam roll tandem balance alternating forward LE 30s x 2 each LE forward Lateral side steps from foam to 6 inch stool left and right x 15 Backwards stepping from foam to 6 inch stool x 15  Staggered stand on blue foam and 6 inch stool with trunk support      Pt educated throughout session about proper posture and technique with exercises. Improved exercise technique, movement at target joints, use of target muscles after min to mod verbal, visual, tactile cues. .  Patient needs occasional verbal cueing to improve posture and cueing to correctly perform exercises slowly, holding at end of range to increase motor firing of desired muscle to encourage fatigue.                          PT Education - 11/21/19 0854    Education Details HEP    Person(s) Educated Patient    Methods Explanation    Comprehension Verbalized understanding            PT Short Term Goals - 11/14/19 0953      PT SHORT TERM GOAL #1   Title Patient will be independent in home exercise program to improve strength/mobility for better functional independence with ADLs.    Time 4    Period Weeks    Status New    Target Date 12/12/19             PT Long Term Goals - 11/14/19 0953      PT LONG TERM GOAL #1   Title Patient (> 69 years old) will complete five times sit to stand test in < 15 seconds indicating an increased LE strength and improved balance.    Baseline 11/14/19= 26.62 SEC    Time 8    Period Weeks    Status New    Target Date 01/09/20      PT LONG TERM GOAL #2   Title Patient will increase Berg Balance score by > 6 points to demonstrate decreased fall risk during functional activities.    Baseline 11/14/19= 44/56    Time 8    Period Weeks    Status New    Target Date 01/09/20      PT LONG TERM GOAL #3   Title Patient will  increase six  minute walk test distance to >1000 for progression to community ambulator and improve gait ability    Baseline 11/14/19= 685 FT    Time 8    Period Weeks    Status New    Target Date 01/09/20      PT LONG TERM GOAL #4   Title Patient will increase 10 meter walk test to >1.10m/s as to improve gait speed for better community ambulation and to reduce fall risk.    Baseline 11/14/19= . 81 M/SEC    Time 8    Period Weeks    Status New    Target Date 01/09/20      PT LONG TERM GOAL #5   Title Patient will reduce timed up and go to <11 seconds to reduce fall risk and demonstrate improved transfer/gait ability.    Baseline 11/14/19= 16.37 SEC    Time 8    Period Weeks    Status New    Target Date 01/09/20                 Plan - 11/21/19 0855    Clinical Impression Statement  Pt was able to perform all exercises today with CGA.Marland Kitchen Pt was able to perform all balance and strength exercises, demonstrating decreased  stability.  Pt was able to complete dynamic balance exercises, showing ability to stand on even surfaces with min assist and improve postural reactions to correct self during activities.  Pt requires verbal, visual and tactile cues during exercise in order to complete tasks with proper form and technique.  Pt would continue to benefit from skilled PT services in order to further strengthen LE's, improve static and dynamic balance, and improve coordination in order to increase functional mobility and decrease risk of falls   Personal Factors and Comorbidities Age    Stability/Clinical Decision Making Stable/Uncomplicated    Rehab Potential Good    PT Frequency 2x / week    PT Duration 8 weeks    PT Treatment/Interventions Gait training;Balance training;Therapeutic exercise;Neuromuscular re-education;Patient/family education;Stair training;Therapeutic activities;Dry needling;Functional mobility training    PT Next Visit Plan strength and balance    Consulted and Agree  with Plan of Care Patient           Patient will benefit from skilled therapeutic intervention in order to improve the following deficits and impairments:  Abnormal gait, Decreased activity tolerance, Decreased strength, Pain, Impaired flexibility, Decreased balance, Decreased mobility, Difficulty walking  Visit Diagnosis: Other abnormalities of gait and mobility  Muscle weakness (generalized)     Problem List Patient Active Problem List   Diagnosis Date Noted  . Status post total knee replacement using cement, left 07/26/2017  . Gait disturbance 05/25/2016  . Abnormal brain MRI 05/25/2016  . Memory loss 08/28/2015  . Incontinence 05/06/2015  . Numbness 11/25/2014  . Insomnia 08/01/2014  . Abdominal pain, left upper quadrant 07/16/2014  . Abnormal finding on ultrasound 07/16/2014  . Fatty liver disease, nonalcoholic 07/16/2014  . Small intestinal bacterial overgrowth 07/16/2014  . Asthma without status asthmaticus 06/20/2014  . HLD (hyperlipidemia) 06/20/2014  . Dermatitis, eczematoid 06/20/2014  . Acid reflux 06/20/2014  . Cephalalgia 06/20/2014  . BP (high blood pressure) 06/20/2014  . Adiposity 06/20/2014  . Arthritis, degenerative 06/20/2014  . Allergic rhinitis, seasonal 06/20/2014  . Proximal limb weakness 06/20/2014  . Proximal leg weakness 06/20/2014  . Disturbed cognition 06/20/2014  . Carpal tunnel syndrome 06/20/2014  . Tinnitus 06/20/2014    Ezekiel Ina, PT DPT 11/21/2019, 8:56 AM  [facility] MAIN [facility] SERVICES [address] [place], [place], [address] Phone: [phone]   Fax:  [phone]  Name: Jessica Norman MRN: [id number] Date of Birth: 11/30/1950

## 2019-11-26 ENCOUNTER — Ambulatory Visit: Payer: Medicare Other | Admitting: Physical Therapy

## 2019-11-26 ENCOUNTER — Encounter: Payer: Self-pay | Admitting: Physical Therapy

## 2019-11-26 ENCOUNTER — Other Ambulatory Visit: Payer: Self-pay

## 2019-11-26 DIAGNOSIS — R2689 Other abnormalities of gait and mobility: Secondary | ICD-10-CM

## 2019-11-26 DIAGNOSIS — M6281 Muscle weakness (generalized): Secondary | ICD-10-CM

## 2019-11-26 NOTE — Therapy (Signed)
[place] [facility] MAIN [facility] SERVICES [address] [place], [place], [address] Phone: [phone]   Fax:  [phone]  Physical Therapy Treatment  Patient Details  Name: Jessica Norman MRN: [id number] Date of Birth: May 10, 1950 Referring Provider (PT): Despina Arias   Encounter Date: 11/26/2019   PT End of Session - 11/26/19 0954    Visit Number 4    Number of Visits 17    Date for PT Re-Evaluation 01/09/20    PT Start Time 0939    PT Stop Time 1017    PT Time Calculation (min) 38 min    Equipment Utilized During Treatment Gait belt    Activity Tolerance Patient tolerated treatment well    Behavior During Therapy [facility] for tasks assessed/performed           Past Medical History:  Diagnosis Date  . Arthritis   . Asthma   . Bipolar disorder (HCC)   . GERD (gastroesophageal reflux disease)   . Hearing loss   . History of hiatal hernia 2011   treated with medication, no surgical repair  . History of Sjogren's disease (HCC)   . HLD (hyperlipidemia)   . Hypertension   . Hypothyroidism   . Incomplete bladder emptying   . Memory loss   . Obesity   . Recurrent UTI   . Vision abnormalities     Past Surgical History:  Procedure Laterality Date  . ABDOMINAL HYSTERECTOMY    . BACK SURGERY    . CARPAL TUNNEL RELEASE Right   . EXCISION OF TONGUE LESION N/A 12/19/2014   Procedure: EXCISION OF TONGUE LESION;  Surgeon: Bud Face, MD;  Location: [facility] ORS;  Service: ENT;  Laterality: N/A;  . EYE SURGERY     cataracts  . FOOT SURGERY    . NECK SURGERY    . SHOULDER ARTHROSCOPY WITH OPEN ROTATOR CUFF REPAIR Left 01/16/2015   Procedure: SHOULDER ARTHROSCOPY WITH mini OPEN ROTATOR CUFF REPAIR,subacromial decompression, abrasion chondrop;asty with microfracture of glenoid, excision distal clavicle, biceps tenodesis, extensive debridement;  Surgeon: Christena Flake, MD;  Location: [facility] ORS;  Service: Orthopedics;  Laterality: Left;  . TOTAL KNEE  ARTHROPLASTY Left 07/26/2017   Procedure: TOTAL KNEE ARTHROPLASTY;  Surgeon: Christena Flake, MD;  Location: [facility] ORS;  Service: Orthopedics;  Laterality: Left;  Marland Kitchen VESICOVAGINAL FISTULA CLOSURE W/ TAH      There were no vitals filed for this visit.   Subjective Assessment - 11/26/19 0942    Subjective Patient reports that she is unsteady with her walking. She has pain in herright  knee today..    Pertinent History per MD note:She is a 69 year old woman who notes reduced gait.   She has reduced balance and strength.   She feels she wobbles when she stops.  She needs to use her arms to get out of a chair.  Around the house she walks without her cane but needs to use it outside the house.    She had one fall 06/2019 and feels she has done worse since then.   She landed on her buttock and her head hit the cabinet.   Initially, she had trouble getting up but her husband was able to get her up.   The right leg seems weaker but she notes an arthritic knee.  Yesterday, she had arthrocentesis and Synvisc injection.   Her left knee has a TKR.  If right knee does not improve, she may need surgery.    She  notes numbness in her legs, worse in the bottom of her feet.   Numbness extends proximally to above the ankles.  Additionally, she has numbness with some discomfort that radiates from the neck into the right arm.  Sometimes the hand seems numb. She has had urinary incontinence since earlier this year,a little worse after her fall.  At bedtime while lying down, she notes twitches in her face and arms.    Currently in Pain? Yes    Pain Score 5     Pain Location Knee    Pain Orientation Right    Pain Descriptors / Indicators Aching    Pain Type Chronic pain    Pain Onset More than a month ago           Ther-ex  Nu-step  x 5 mins  Hip flexion marches with 2# ankle weights x 10 bilateral Hip abduction with 2# x 10 bilateral Hip extension with 2# x 10 bilateral Quantum leg press 25# x 20 x 3 sets  Lunges to  BOSU ball x 15 BLE Heel raises x 15 x 2 sets Step ups to 6-inch stool x 20  Eccentric step downs from 3 inch stool x 10 verbal cues to complete slow and tap heel.  BUE CGA Patient needs occasional verbal cueing to improve posture and cueing to correctly perform exercises slowly, holding at end of range to increase motor firing of desired muscle to encourage fatigue.                           PT Education - 11/26/19 0953    Education Details HEP, stretching    Person(s) Educated Patient    Methods Explanation    Comprehension Verbalized understanding            PT Short Term Goals - 11/14/19 0953      PT SHORT TERM GOAL #1   Title Patient will be independent in home exercise program to improve strength/mobility for better functional independence with ADLs.    Time 4    Period Weeks    Status New    Target Date 12/12/19             PT Long Term Goals - 11/14/19 0953      PT LONG TERM GOAL #1   Title Patient (> 4 years old) will complete five times sit to stand test in < 15 seconds indicating an increased LE strength and improved balance.    Baseline 11/14/19= 26.62 SEC    Time 8    Period Weeks    Status New    Target Date 01/09/20      PT LONG TERM GOAL #2   Title Patient will increase Berg Balance score by > 6 points to demonstrate decreased fall risk during functional activities.    Baseline 11/14/19= 44/56    Time 8    Period Weeks    Status New    Target Date 01/09/20      PT LONG TERM GOAL #3   Title Patient will increase six minute walk test distance to >1000 for progression to community ambulator and improve gait ability    Baseline 11/14/19= 685 FT    Time 8    Period Weeks    Status New    Target Date 01/09/20      PT LONG TERM GOAL #4   Title Patient will increase 10 meter walk test to >1.37m/s as to improve  gait speed for better community ambulation and to reduce fall risk.    Baseline 11/14/19= . 81 M/SEC    Time 8     Period Weeks    Status New    Target Date 01/09/20      PT LONG TERM GOAL #5   Title Patient will reduce timed up and go to <11 seconds to reduce fall risk and demonstrate improved transfer/gait ability.    Baseline 11/14/19= 16.37 SEC    Time 8    Period Weeks    Status New    Target Date 01/09/20                 Plan - 11/26/19 0956    Clinical Impression Statement Patient instructed in intermediate strengthening and balance exercise.  Patient requires min Vcs for correct exercise technique including to improve LE control with standing exercise. Patient demonstrates better knee control with SLS tasks with rail assist. Patient would benefit from additional skilled PT intervention to improve balance/gait safety and reduce fall risk.   Personal Factors and Comorbidities Age    Stability/Clinical Decision Making Stable/Uncomplicated    Rehab Potential Good    PT Frequency 2x / week    PT Duration 8 weeks    PT Treatment/Interventions Gait training;Balance training;Therapeutic exercise;Neuromuscular re-education;Patient/family education;Stair training;Therapeutic activities;Dry needling;Functional mobility training    PT Next Visit Plan strength and balance    Consulted and Agree with Plan of Care Patient           Patient will benefit from skilled therapeutic intervention in order to improve the following deficits and impairments:  Abnormal gait, Decreased activity tolerance, Decreased strength, Pain, Impaired flexibility, Decreased balance, Decreased mobility, Difficulty walking  Visit Diagnosis: Other abnormalities of gait and mobility  Muscle weakness (generalized)     Problem List Patient Active Problem List   Diagnosis Date Noted  . Status post total knee replacement using cement, left 07/26/2017  . Gait disturbance 05/25/2016  . Abnormal brain MRI 05/25/2016  . Memory loss 08/28/2015  . Incontinence 05/06/2015  . Numbness 11/25/2014  . Insomnia 08/01/2014   . Abdominal pain, left upper quadrant 07/16/2014  . Abnormal finding on ultrasound 07/16/2014  . Fatty liver disease, nonalcoholic 07/16/2014  . Small intestinal bacterial overgrowth 07/16/2014  . Asthma without status asthmaticus 06/20/2014  . HLD (hyperlipidemia) 06/20/2014  . Dermatitis, eczematoid 06/20/2014  . Acid reflux 06/20/2014  . Cephalalgia 06/20/2014  . BP (high blood pressure) 06/20/2014  . Adiposity 06/20/2014  . Arthritis, degenerative 06/20/2014  . Allergic rhinitis, seasonal 06/20/2014  . Proximal limb weakness 06/20/2014  . Proximal leg weakness 06/20/2014  . Disturbed cognition 06/20/2014  . Carpal tunnel syndrome 06/20/2014  . Tinnitus 06/20/2014    Ezekiel Ina, PT DPT 11/26/2019, 10:08 AM  [place] [facility] MAIN [facility] SERVICES [address] [place], [place], [address] Phone: [phone]   Fax:  [phone]  Name: Jessica Norman MRN: [id number] Date of Birth: [birth year]

## 2019-11-28 ENCOUNTER — Encounter: Payer: Self-pay | Admitting: Physical Therapy

## 2019-11-28 ENCOUNTER — Ambulatory Visit: Payer: Medicare Other | Admitting: Physical Therapy

## 2019-11-28 ENCOUNTER — Other Ambulatory Visit: Payer: Self-pay

## 2019-11-28 DIAGNOSIS — M6281 Muscle weakness (generalized): Secondary | ICD-10-CM

## 2019-11-28 DIAGNOSIS — R2689 Other abnormalities of gait and mobility: Secondary | ICD-10-CM

## 2019-11-28 NOTE — Therapy (Signed)
[facility] MAIN [facility] SERVICES [address] [place], [place], [address] Phone: [phone]   Fax:  [phone]  Physical Therapy Treatment  Patient Details  Name: Jessica Norman MRN: [id number] Date of Birth: [birth year] Referring Provider (PT): Despina Arias   Encounter Date: 11/28/2019   PT End of Session - 11/28/19 0938    Visit Number 5    Number of Visits 17    Date for PT Re-Evaluation 01/09/20    PT Start Time 0934    PT Stop Time 1015    PT Time Calculation (min) 41 min    Equipment Utilized During Treatment Gait belt    Activity Tolerance Patient tolerated treatment well    Behavior During Therapy [facility] for tasks assessed/performed           Past Medical History:  Diagnosis Date  . Arthritis   . Asthma   . Bipolar disorder (HCC)   . GERD (gastroesophageal reflux disease)   . Hearing loss   . History of hiatal hernia 2011   treated with medication, no surgical repair  . History of Sjogren's disease (HCC)   . HLD (hyperlipidemia)   . Hypertension   . Hypothyroidism   . Incomplete bladder emptying   . Memory loss   . Obesity   . Recurrent UTI   . Vision abnormalities     Past Surgical History:  Procedure Laterality Date  . ABDOMINAL HYSTERECTOMY    . BACK SURGERY    . CARPAL TUNNEL RELEASE Right   . EXCISION OF TONGUE LESION N/A 12/19/2014   Procedure: EXCISION OF TONGUE LESION;  Surgeon: Bud Face, MD;  Location: [facility] ORS;  Service: ENT;  Laterality: N/A;  . EYE SURGERY     cataracts  . FOOT SURGERY    . NECK SURGERY    . SHOULDER ARTHROSCOPY WITH OPEN ROTATOR CUFF REPAIR Left 01/16/2015   Procedure: SHOULDER ARTHROSCOPY WITH mini OPEN ROTATOR CUFF REPAIR,subacromial decompression, abrasion chondrop;asty with microfracture of glenoid, excision distal clavicle, biceps tenodesis, extensive debridement;  Surgeon: Christena Flake, MD;  Location: [facility] ORS;  Service: Orthopedics;  Laterality: Left;  . TOTAL KNEE  ARTHROPLASTY Left 07/26/2017   Procedure: TOTAL KNEE ARTHROPLASTY;  Surgeon: Christena Flake, MD;  Location: [facility] ORS;  Service: Orthopedics;  Laterality: Left;  Marland Kitchen VESICOVAGINAL FISTULA CLOSURE W/ TAH      There were no vitals filed for this visit.   Subjective Assessment - 11/28/19 0937    Subjective Patient reports that she is unsteady with her walking. She has pain in her right  knee today..    Pertinent History per MD note:She is a 69 year old woman who notes reduced gait.   She has reduced balance and strength.   She feels she wobbles when she stops.  She needs to use her arms to get out of a chair.  Around the house she walks without her cane but needs to use it outside the house.    She had one fall 06/2019 and feels she has done worse since then.   She landed on her buttock and her head hit the cabinet.   Initially, she had trouble getting up but her husband was able to get her up.   The right leg seems weaker but she notes an arthritic knee.  Yesterday, she had arthrocentesis and Synvisc injection.   Her left knee has a TKR.  If right knee does not improve, she may need surgery.  She notes numbness in her legs, worse in the bottom of her feet.   Numbness extends proximally to above the ankles.  Additionally, she has numbness with some discomfort that radiates from the neck into the right arm.  Sometimes the hand seems numb. She has had urinary incontinence since earlier this year,a little worse after her fall.  At bedtime while lying down, she notes twitches in her face and arms.    Pain Onset More than a month ago           Ther-ex  Oct[employer] fitness L2   x 5 mins  Hip flexion marches with 2# ankle weights x 10 bilateral Hip abduction with 2# x 10 bilateral Hip extension with 2# x 10 bilateral Quantum leg press 25# x 20 x 3 sets  Sit to stand without UE support from regular height chair x 10   Squats x 15 with cues for correct posture Lunges to BOSU ball x 15 BLE Lunges to 6 inch stool x  15 BLE Heel raises x 15 x 2 sets Step ups to 6-inch stool x 20  Eccentric step downs from 3 inch stool x 10 verbal cues to complete slow and tap heel.  BUE CGA Patient needs occasional verbal cueing to improve posture and cueing to correctly perform exercises slowly, holding at end of range to increase motor firing of desired muscle to encourage fatigue.                           PT Education - 11/28/19 [phone]    Education Details HEP    Person(s) Educated Patient    Methods Explanation    Comprehension Verbalized understanding;Need further instruction;Returned demonstration            PT Short Term Goals - 11/14/19 0953      PT SHORT TERM GOAL #1   Title Patient will be independent in home exercise program to improve strength/mobility for better functional independence with ADLs.    Time 4    Period Weeks    Status New    Target Date 12/12/19             PT Long Term Goals - 11/14/19 0953      PT LONG TERM GOAL #1   Title Patient (> 69 years old) will complete five times sit to stand test in < 15 seconds indicating an increased LE strength and improved balance.    Baseline 11/14/19= 26.62 SEC    Time 8    Period Weeks    Status New    Target Date 01/09/20      PT LONG TERM GOAL #2   Title Patient will increase Berg Balance score by > 6 points to demonstrate decreased fall risk during functional activities.    Baseline 11/14/19= 44/56    Time 8    Period Weeks    Status New    Target Date 01/09/20      PT LONG TERM GOAL #3   Title Patient will increase six minute walk test distance to >1000 for progression to community ambulator and improve gait ability    Baseline 11/14/19= 685 FT    Time 8    Period Weeks    Status New    Target Date 01/09/20      PT LONG TERM GOAL #4   Title Patient will increase 10 meter walk test to >1.36m/s as to improve gait speed for better community  ambulation and to reduce fall risk.    Baseline 11/14/19= . 81  M/SEC    Time 8    Period Weeks    Status New    Target Date 01/09/20      PT LONG TERM GOAL #5   Title Patient will reduce timed up and go to <11 seconds to reduce fall risk and demonstrate improved transfer/gait ability.    Baseline 11/14/19= 16.37 SEC    Time 8    Period Weeks    Status New    Target Date 01/09/20                 Plan - 11/28/19 3149    Clinical Impression Statement Patient demonstrates deficits with postural control in tandem and narrow stance on uneven surfaces. Patient demonstrated slow movement during lunge but minimal cueing resulted in good technique and no LOB.  Patient improved ability to challenge dynamic balance with supervision and without UE assist today.  Patient shows BLE strength deficits due to right knee pain. Patient will continue to benefit from skilled physical therapy to improve endurance and dynamic balance to reduce fall risk.    Personal Factors and Comorbidities Age    Stability/Clinical Decision Making Stable/Uncomplicated    Rehab Potential Good    PT Frequency 2x / week    PT Duration 8 weeks    PT Treatment/Interventions Gait training;Balance training;Therapeutic exercise;Neuromuscular re-education;Patient/family education;Stair training;Therapeutic activities;Dry needling;Functional mobility training    PT Next Visit Plan strength and balance    Consulted and Agree with Plan of Care Patient           Patient will benefit from skilled therapeutic intervention in order to improve the following deficits and impairments:  Abnormal gait, Decreased activity tolerance, Decreased strength, Pain, Impaired flexibility, Decreased balance, Decreased mobility, Difficulty walking  Visit Diagnosis: Other abnormalities of gait and mobility  Muscle weakness (generalized)     Problem List Patient Active Problem List   Diagnosis Date Noted  . Status post total knee replacement using cement, left 07/26/2017  . Gait disturbance  05/25/2016  . Abnormal brain MRI 05/25/2016  . Memory loss 08/28/2015  . Incontinence 05/06/2015  . Numbness 11/25/2014  . Insomnia 08/01/2014  . Abdominal pain, left upper quadrant 07/16/2014  . Abnormal finding on ultrasound 07/16/2014  . Fatty liver disease, nonalcoholic 07/16/2014  . Small intestinal bacterial overgrowth 07/16/2014  . Asthma without status asthmaticus 06/20/2014  . HLD (hyperlipidemia) 06/20/2014  . Dermatitis, eczematoid 06/20/2014  . Acid reflux 06/20/2014  . Cephalalgia 06/20/2014  . BP (high blood pressure) 06/20/2014  . Adiposity 06/20/2014  . Arthritis, degenerative 06/20/2014  . Allergic rhinitis, seasonal 06/20/2014  . Proximal limb weakness 06/20/2014  . Proximal leg weakness 06/20/2014  . Disturbed cognition 06/20/2014  . Carpal tunnel syndrome 06/20/2014  . Tinnitus 06/20/2014    Ezekiel Ina, PT DPT 11/28/2019, 9:39 AM  [place] [facility] MAIN [facility] SERVICES [address] [place], [place], [address] Phone: [phone]   Fax:  [phone]  Name: Jessica Norman MRN: [id number] Date of Birth: January 20, 1950

## 2019-12-03 ENCOUNTER — Ambulatory Visit: Payer: Medicare Other | Admitting: Physical Therapy

## 2019-12-05 ENCOUNTER — Ambulatory Visit: Payer: Medicare Other | Admitting: Physical Therapy

## 2019-12-10 ENCOUNTER — Ambulatory Visit: Payer: Medicare Other | Admitting: Physical Therapy

## 2019-12-12 ENCOUNTER — Ambulatory Visit: Payer: Medicare Other | Admitting: Physical Therapy

## 2019-12-17 ENCOUNTER — Ambulatory Visit: Payer: Medicare Other

## 2019-12-19 ENCOUNTER — Ambulatory Visit: Payer: Medicare Other | Attending: Neurology

## 2019-12-24 ENCOUNTER — Ambulatory Visit: Payer: Medicare Other

## 2019-12-26 ENCOUNTER — Ambulatory Visit: Payer: Medicare Other

## 2019-12-31 ENCOUNTER — Ambulatory Visit: Payer: Medicare Other

## 2020-01-02 ENCOUNTER — Ambulatory Visit: Payer: Medicare Other

## 2020-01-03 ENCOUNTER — Other Ambulatory Visit: Payer: Self-pay | Admitting: Physical Medicine & Rehabilitation

## 2020-01-03 ENCOUNTER — Other Ambulatory Visit: Payer: Self-pay | Admitting: Obstetrics and Gynecology

## 2020-01-03 DIAGNOSIS — M5416 Radiculopathy, lumbar region: Secondary | ICD-10-CM

## 2020-01-07 ENCOUNTER — Ambulatory Visit: Payer: Medicare Other

## 2020-01-09 ENCOUNTER — Ambulatory Visit: Payer: Medicare Other | Admitting: Neurology

## 2020-01-14 ENCOUNTER — Ambulatory Visit
Admission: RE | Admit: 2020-01-14 | Discharge: 2020-01-14 | Disposition: A | Discharge status: Home / Self Care | Payer: Medicare Other | Source: Ambulatory Visit | Attending: Physical Medicine & Rehabilitation | Admitting: Physical Medicine & Rehabilitation

## 2020-01-14 ENCOUNTER — Other Ambulatory Visit: Payer: Self-pay

## 2020-01-14 DIAGNOSIS — M5416 Radiculopathy, lumbar region: Secondary | ICD-10-CM | POA: Diagnosis present

## 2020-01-23 ENCOUNTER — Other Ambulatory Visit: Payer: Self-pay | Admitting: Neurosurgery

## 2020-01-31 ENCOUNTER — Other Ambulatory Visit: Payer: Self-pay

## 2020-01-31 ENCOUNTER — Encounter
Admission: RE | Admit: 2020-01-31 | Discharge: 2020-01-31 | Disposition: A | Discharge status: Home / Self Care | Payer: Medicare Other | Source: Ambulatory Visit | Attending: Neurosurgery | Admitting: Neurosurgery

## 2020-01-31 DIAGNOSIS — Z01812 Encounter for preprocedural laboratory examination: Secondary | ICD-10-CM | POA: Diagnosis present

## 2020-01-31 LAB — APTT: aPTT: 26 seconds (ref 24–36)

## 2020-01-31 LAB — BASIC METABOLIC PANEL
Anion gap: 9 (ref 5–15)
BUN: 19 mg/dL (ref 8–23)
CO2: 29 mmol/L (ref 22–32)
Calcium: 9 mg/dL (ref 8.9–10.3)
Chloride: 100 mmol/L (ref 98–111)
Creatinine, Ser: 0.76 mg/dL (ref 0.44–1.00)
GFR, Estimated: >60 mL/min (ref >60)
Glucose, Bld: 93 mg/dL (ref 70–99)
Potassium: 3.8 mmol/L (ref 3.5–5.1)
Sodium: 138 mmol/L (ref 135–145)

## 2020-01-31 LAB — CBC
HCT: 39.2 % (ref 36.0–46.0)
Hemoglobin: 13 g/dL (ref 12.0–15.0)
MCH: 29.1 pg (ref 26.0–34.0)
MCHC: 33.2 g/dL (ref 30.0–36.0)
MCV: 87.9 fL (ref 80.0–100.0)
Platelets: 171 10*3/uL (ref 150–400)
RBC: 4.46 MIL/uL (ref 3.87–5.11)
RDW: 12.7 % (ref 11.5–15.5)
WBC: 4.4 10*3/uL (ref 4.0–10.5)
nRBC: 0 % (ref 0.0–0.2)

## 2020-01-31 LAB — TYPE AND SCREEN
ABO/RH(D): A POS
Antibody Screen: NEGATIVE

## 2020-01-31 LAB — PROTIME-INR
INR: 1 (ref 0.8–1.2)
Prothrombin Time: 12.8 seconds (ref 11.4–15.2)

## 2020-01-31 LAB — SURGICAL PCR SCREEN
MRSA, PCR: NEGATIVE
Staphylococcus aureus: NEGATIVE

## 2020-01-31 NOTE — Patient Instructions (Addendum)
Your procedure is scheduled on: 02/04/2020- MONDAY Report to the Registration Desk on the 1st floor of the [facility]. To find out your arrival time, please call [phone] between 1PM - 3PM on: 02/01/20- FRIDAY  REMEMBER: Instructions that are not followed completely may result in serious medical risk, up to and including death; or upon the discretion of your surgeon and anesthesiologist your surgery may need to be rescheduled.  Do not eat food after midnight the night before surgery.  No gum chewing, lozengers or hard candies.  You may however, drink CLEAR liquids up to 2 hours before you are scheduled to arrive for your surgery. Do not drink anything within 2 hours of your scheduled arrival time.  Clear liquids include: - water  - apple juice without pulp - gatorade (not RED, PURPLE, OR BLUE) - black coffee or tea (Do NOT add milk or creamers to the coffee or tea) Do NOT drink anything that is not on this list.  TAKE THESE MEDICATIONS THE MORNING OF SURGERY WITH A SIP OF WATER: - amLODipine (NORVASC) 10 MG tablet - carvedilol (COREG) 6.25 MG tablet - levothyroxine (SYNTHROID) 50 MCG tablet - sertraline (ZOLOFT) 100 MG tablet - cycloSPORINE (RESTASIS) 0.05 % ophthalmic emulsion  Follow recommendations from Cardiologist, Pulmonologist or PCP regarding stopping Aspirin, Coumadin, Plavix, Eliquis, Pradaxa, or Pletal. STOP ASPIRIN 81 MG 7 DAYS PRIOR TO SURGERY AND DO NOT RESTART UNTIL 7 DAYS AFTER SURGERY.  - STOP 7 DAYS PRIOR TO SURGERY- hydroxychloroquine (PLAQUENIL) 200 MG tablet  One week prior to surgery STOP TAKING 01/31/20 : sulindac (CLINORIL) 200 MG tablet Stop Anti-inflammatories (NSAIDS) such as Advil, Aleve, Ibuprofen, Motrin, Naproxen, Naprosyn and Aspirin based products such as Excedrin, Goodys Powder, BC Powder.  Stop STARTING 01/31/2020,  ANY OVER THE COUNTER supplements until after surgery.Misc Natural Products (OSTEO BI-FLEX TRIPLE STRENGTH) TABS (However, you  may continue taking Vitamin D, Vitamin B, and multivitamin up until the day before surgery.)  No Alcohol for 24 hours before or after surgery.  No Smoking including e-cigarettes for 24 hours prior to surgery.  No chewable tobacco products for at least 6 hours prior to surgery.  No nicotine patches on the day of surgery.  Do not use any "recreational" drugs for at least a week prior to your surgery.  Please be advised that the combination of cocaine and anesthesia may have negative outcomes, up to and including death. If you test positive for cocaine, your surgery will be cancelled.  On the morning of surgery brush your teeth with toothpaste and water, you may rinse your mouth with mouthwash if you wish. Do not swallow any toothpaste or mouthwash.  Do not wear jewelry, make-up, hairpins, clips or nail polish.  Do not wear lotions, powders, or perfumes.   Do not shave body from the neck down 48 hours prior to surgery just in case you cut yourself which could leave a site for infection.  Also, freshly shaved skin may become irritated if using the CHG soap.  Contact lenses, hearing aids and dentures may not be worn into surgery.  Do not bring valuables to the hospital. [facility] is not responsible for any missing/lost belongings or valuables.   Use CHG Soap or wipes as directed on instruction sheet.  Notify your doctor if there is any change in your medical condition (cold, fever, infection).  Wear comfortable clothing (specific to your surgery type) to the hospital.  Plan for stool softeners for home use; pain medications have a tendency  to cause constipation. You can also help prevent constipation by eating foods high in fiber such as fruits and vegetables and drinking plenty of fluids as your diet allows.  After surgery, you can help prevent lung complications by doing breathing exercises.  Take deep breaths and cough every 1-2 hours. Your doctor may order a device called an  Incentive Spirometer to help you take deep breaths. When coughing or sneezing, hold a pillow firmly against your incision with both hands. This is called "splinting." Doing this helps protect your incision. It also decreases belly discomfort.  If you are being admitted to the hospital overnight, leave your suitcase in the car. After surgery it may be brought to your room.  If you are being discharged the day of surgery, you will not be allowed to drive home. You will need a responsible adult (18 years or older) to drive you home and stay with you that night.   If you are taking public transportation, you will need to have a responsible adult (18 years or older) with you. Please confirm with your physician that it is acceptable to use public transportation.   Please call the Pre-admissions Testing Dept. at [phone] if you have any questions about these instructions.  Visitation Policy:  Patients undergoing a surgery or procedure may have one family member or support person with them as long as that person is not COVID-19 positive or experiencing its symptoms.  That person may remain in the waiting area during the procedure.  Inpatient Visitation:    Visiting hours are 7 a.m. to 8 p.m. Patients will be allowed one visitor. The visitor may change daily. The visitor must pass COVID-19 screenings, use hand sanitizer when entering and exiting the patient's room and wear a mask at all times, including in the patient's room. Patients must also wear a mask when staff or their visitor are in the room. Masking is required regardless of vaccination status. Systemwide, no visitors 17 or younger.

## 2020-02-01 ENCOUNTER — Other Ambulatory Visit
Admission: RE | Admit: 2020-02-01 | Discharge: 2020-02-01 | Disposition: A | Discharge status: Home / Self Care | Payer: Medicare Other | Source: Ambulatory Visit | Attending: Neurosurgery | Admitting: Neurosurgery

## 2020-02-01 DIAGNOSIS — Z01812 Encounter for preprocedural laboratory examination: Secondary | ICD-10-CM | POA: Insufficient documentation

## 2020-02-01 DIAGNOSIS — Z20822 Contact with and (suspected) exposure to covid-19: Secondary | ICD-10-CM | POA: Insufficient documentation

## 2020-02-01 LAB — URINALYSIS, ROUTINE W REFLEX MICROSCOPIC
Bilirubin Urine: NEGATIVE
Glucose, UA: NEGATIVE mg/dL
Hgb urine dipstick: NEGATIVE
Ketones, ur: NEGATIVE mg/dL
Leukocytes,Ua: NEGATIVE
Nitrite: NEGATIVE
Protein, ur: NEGATIVE mg/dL
Specific Gravity, Urine: 1.026 (ref 1.005–1.030)
pH: 5 (ref 5.0–8.0)

## 2020-02-01 LAB — SARS CORONAVIRUS 2 (TAT 6-24 HRS): SARS Coronavirus 2: NEGATIVE

## 2020-02-04 ENCOUNTER — Ambulatory Visit: Payer: Medicare Other

## 2020-02-04 ENCOUNTER — Other Ambulatory Visit: Payer: Self-pay

## 2020-02-04 ENCOUNTER — Ambulatory Visit: Payer: Medicare Other | Admitting: Urgent Care

## 2020-02-04 ENCOUNTER — Encounter: Payer: Self-pay | Admitting: Neurosurgery

## 2020-02-04 ENCOUNTER — Observation Stay
Admission: RE | Admit: 2020-02-04 | Discharge: 2020-02-05 | Disposition: A | Discharge status: Home / Self Care | Payer: Medicare Other | Attending: Neurosurgery | Admitting: Neurosurgery

## 2020-02-04 ENCOUNTER — Encounter
Admission: RE | Disposition: A | Discharge status: Home / Self Care | Payer: Self-pay | Source: Home / Self Care | Attending: Neurosurgery

## 2020-02-04 DIAGNOSIS — M48061 Spinal stenosis, lumbar region without neurogenic claudication: Secondary | ICD-10-CM | POA: Diagnosis not present

## 2020-02-04 DIAGNOSIS — R32 Unspecified urinary incontinence: Secondary | ICD-10-CM | POA: Diagnosis not present

## 2020-02-04 DIAGNOSIS — R296 Repeated falls: Secondary | ICD-10-CM | POA: Insufficient documentation

## 2020-02-04 DIAGNOSIS — Z419 Encounter for procedure for purposes other than remedying health state, unspecified: Secondary | ICD-10-CM

## 2020-02-04 DIAGNOSIS — M79604 Pain in right leg: Secondary | ICD-10-CM | POA: Diagnosis present

## 2020-02-04 HISTORY — PX: LUMBAR LAMINECTOMY/DECOMPRESSION MICRODISCECTOMY: SHX5026

## 2020-02-04 SURGERY — LUMBAR LAMINECTOMY/DECOMPRESSION MICRODISCECTOMY 3 LEVELS
Anesthesia: General | Laterality: Right

## 2020-02-04 MED ORDER — KETOROLAC TROMETHAMINE 30 MG/ML IJ SOLN
INTRAMUSCULAR | Status: AC
  Filled 2020-02-04: qty 1

## 2020-02-04 MED ORDER — PHENYLEPHRINE HCL (PRESSORS) 10 MG/ML IV SOLN
INTRAVENOUS | Status: DC | PRN
  Administered 2020-02-04 (×4): 100 ug via INTRAVENOUS

## 2020-02-04 MED ORDER — REMIFENTANIL HCL 1 MG IV SOLR
INTRAVENOUS | Status: DC | PRN
  Administered 2020-02-04: .1 ug/kg/min via INTRAVENOUS

## 2020-02-04 MED ORDER — FLEET ENEMA 7-19 GM/118ML RE ENEM: 1.0000 | ENEMA | Freq: Once | RECTAL | Status: DC | PRN

## 2020-02-04 MED ORDER — HYDROMORPHONE HCL 1 MG/ML IJ SOLN: 0.5000 mg | INTRAMUSCULAR | Status: DC | PRN

## 2020-02-04 MED ORDER — LABETALOL HCL 5 MG/ML IV SOLN
INTRAVENOUS | Status: AC
  Administered 2020-02-04: 10 mg via INTRAVENOUS
  Filled 2020-02-04: qty 4

## 2020-02-04 MED ORDER — ACETAMINOPHEN 500 MG PO TABS: 1000.0000 mg | ORAL_TABLET | Freq: Four times a day (QID) | ORAL | Status: DC

## 2020-02-04 MED ORDER — ALUM & MAG HYDROXIDE-SIMETH 200-200-20 MG/5ML PO SUSP: 30.0000 mL | Freq: Four times a day (QID) | ORAL | Status: DC | PRN

## 2020-02-04 MED ORDER — ONDANSETRON HCL 4 MG/2ML IJ SOLN: 4.0000 mg | Freq: Once | INTRAMUSCULAR | Status: DC | PRN

## 2020-02-04 MED ORDER — EPHEDRINE SULFATE 50 MG/ML IJ SOLN
INTRAMUSCULAR | Status: DC | PRN
  Administered 2020-02-04 (×3): 10 mg via INTRAVENOUS

## 2020-02-04 MED ORDER — GLYCOPYRROLATE 0.2 MG/ML IJ SOLN
INTRAMUSCULAR | Status: DC | PRN
  Administered 2020-02-04: .2 mg via INTRAVENOUS

## 2020-02-04 MED ORDER — BUPIVACAINE HCL (PF) 0.5 % IJ SOLN
INTRAMUSCULAR | Status: DC | PRN
  Administered 2020-02-04: 30 mL

## 2020-02-04 MED ORDER — METHOCARBAMOL 750 MG PO TABS
750.0000 mg | ORAL_TABLET | Freq: Four times a day (QID) | ORAL | Status: DC
  Administered 2020-02-04: 750 mg via ORAL

## 2020-02-04 MED ORDER — FENTANYL CITRATE (PF) 100 MCG/2ML IJ SOLN
INTRAMUSCULAR | Status: AC
  Filled 2020-02-04: qty 2

## 2020-02-04 MED ORDER — CEFAZOLIN SODIUM-DEXTROSE 2-4 GM/100ML-% IV SOLN
2.0000 g | Freq: Once | INTRAVENOUS | Status: AC
  Administered 2020-02-04: 2 g via INTRAVENOUS

## 2020-02-04 MED ORDER — LEVOTHYROXINE SODIUM 50 MCG PO TABS
50.0000 ug | ORAL_TABLET | Freq: Every day | ORAL | Status: DC
  Administered 2020-02-05: 50 ug via ORAL
  Filled 2020-02-04: qty 1

## 2020-02-04 MED ORDER — LABETALOL HCL 5 MG/ML IV SOLN
10.0000 mg | INTRAVENOUS | Status: AC | PRN
  Administered 2020-02-04: 10 mg via INTRAVENOUS

## 2020-02-04 MED ORDER — CEFAZOLIN SODIUM-DEXTROSE 2-4 GM/100ML-% IV SOLN
INTRAVENOUS | Status: AC
  Filled 2020-02-04: qty 100

## 2020-02-04 MED ORDER — PHENOL 1.4 % MT LIQD
1.0000 | OROMUCOSAL | Status: DC | PRN
  Filled 2020-02-04: qty 177

## 2020-02-04 MED ORDER — LACTATED RINGERS IV SOLN: INTRAVENOUS | Status: DC

## 2020-02-04 MED ORDER — ROCURONIUM BROMIDE 100 MG/10ML IV SOLN
INTRAVENOUS | Status: DC | PRN
Start: 2020-02-04 — End: 2020-02-04
  Administered 2020-02-04: 30 mg via INTRAVENOUS

## 2020-02-04 MED ORDER — SERTRALINE HCL 100 MG PO TABS
100.0000 mg | ORAL_TABLET | Freq: Every day | ORAL | Status: DC
  Administered 2020-02-05: 100 mg via ORAL
  Filled 2020-02-04: qty 1

## 2020-02-04 MED ORDER — SODIUM CHLORIDE 0.9 % IV SOLN
50.0000 mL/h | INTRAVENOUS | Status: DC
  Administered 2020-02-04: 50 mL/h via INTRAVENOUS

## 2020-02-04 MED ORDER — HYDRALAZINE HCL 20 MG/ML IJ SOLN: 5.0000 mg | INTRAMUSCULAR | Status: DC | PRN

## 2020-02-04 MED ORDER — HYDRALAZINE HCL 20 MG/ML IJ SOLN
INTRAMUSCULAR | Status: AC
  Administered 2020-02-04: 10 mg via INTRAVENOUS
  Filled 2020-02-04: qty 1

## 2020-02-04 MED ORDER — ORAL CARE MOUTH RINSE: 15.0000 mL | Freq: Once | OROMUCOSAL | Status: AC

## 2020-02-04 MED ORDER — CARVEDILOL 12.5 MG PO TABS: 6.2500 mg | ORAL_TABLET | Freq: Two times a day (BID) | ORAL | Status: DC

## 2020-02-04 MED ORDER — POLYETHYLENE GLYCOL 3350 17 G PO PACK
17.0000 g | PACK | Freq: Every day | ORAL | Status: DC | PRN
  Filled 2020-02-04: qty 1

## 2020-02-04 MED ORDER — ACETAMINOPHEN 10 MG/ML IV SOLN
INTRAVENOUS | Status: AC
  Filled 2020-02-04: qty 100

## 2020-02-04 MED ORDER — LACTATED RINGERS IV SOLN: INTRAVENOUS | Status: DC | PRN

## 2020-02-04 MED ORDER — OXYCODONE HCL 5 MG PO TABS
10.0000 mg | ORAL_TABLET | ORAL | Status: DC | PRN
Start: 2020-02-04 — End: 2020-02-05

## 2020-02-04 MED ORDER — ONDANSETRON HCL 4 MG/2ML IJ SOLN
INTRAMUSCULAR | Status: DC | PRN
  Administered 2020-02-04: 4 mg via INTRAVENOUS

## 2020-02-04 MED ORDER — ACETAMINOPHEN 500 MG PO TABS
ORAL_TABLET | ORAL | Status: AC
  Administered 2020-02-04: 1000 mg via ORAL
  Filled 2020-02-04: qty 2

## 2020-02-04 MED ORDER — MEPERIDINE HCL 50 MG/ML IJ SOLN: 6.2500 mg | INTRAMUSCULAR | Status: DC | PRN

## 2020-02-04 MED ORDER — FENTANYL CITRATE (PF) 100 MCG/2ML IJ SOLN
INTRAMUSCULAR | Status: DC | PRN
  Administered 2020-02-04 (×2): 25 ug via INTRAVENOUS
  Administered 2020-02-04: 50 ug via INTRAVENOUS

## 2020-02-04 MED ORDER — SUGAMMADEX SODIUM 200 MG/2ML IV SOLN
INTRAVENOUS | Status: DC | PRN
  Administered 2020-02-04: 200 mg via INTRAVENOUS

## 2020-02-04 MED ORDER — DIVALPROEX SODIUM ER 500 MG PO TB24
1000.0000 mg | ORAL_TABLET | Freq: Every day | ORAL | Status: DC
  Administered 2020-02-04: 1000 mg via ORAL
  Filled 2020-02-04 (×2): qty 2

## 2020-02-04 MED ORDER — HYDROXYCHLOROQUINE SULFATE 200 MG PO TABS
400.0000 mg | ORAL_TABLET | Freq: Every day | ORAL | Status: DC
  Administered 2020-02-05: 400 mg via ORAL
  Filled 2020-02-04: qty 2

## 2020-02-04 MED ORDER — PROPOFOL 10 MG/ML IV BOLUS
INTRAVENOUS | Status: DC | PRN
  Administered 2020-02-04: 200 mg via INTRAVENOUS

## 2020-02-04 MED ORDER — CARVEDILOL 12.5 MG PO TABS
ORAL_TABLET | ORAL | Status: AC
  Administered 2020-02-04: 6.25 mg via ORAL
  Filled 2020-02-04: qty 1

## 2020-02-04 MED ORDER — SODIUM CHLORIDE 0.9% FLUSH: 3.0000 mL | INTRAVENOUS | Status: DC | PRN

## 2020-02-04 MED ORDER — OXYCODONE HCL 5 MG PO TABS: 5.0000 mg | ORAL_TABLET | ORAL | Status: DC | PRN

## 2020-02-04 MED ORDER — CHLORHEXIDINE GLUCONATE 0.12 % MT SOLN: 15.0000 mL | Freq: Once | OROMUCOSAL | Status: AC

## 2020-02-04 MED ORDER — HYDROMORPHONE HCL 1 MG/ML IJ SOLN
INTRAMUSCULAR | Status: AC
  Administered 2020-02-04: 0.5 mg via INTRAVENOUS
  Filled 2020-02-04: qty 0.5

## 2020-02-04 MED ORDER — LIDOCAINE HCL (CARDIAC) PF 100 MG/5ML IV SOSY
PREFILLED_SYRINGE | INTRAVENOUS | Status: DC | PRN
  Administered 2020-02-04: 100 mg via INTRAVENOUS

## 2020-02-04 MED ORDER — BUPIVACAINE-EPINEPHRINE (PF) 0.5% -1:200000 IJ SOLN
INTRAMUSCULAR | Status: DC | PRN
  Administered 2020-02-04: 10 mL

## 2020-02-04 MED ORDER — FAMOTIDINE 20 MG PO TABS: 20.0000 mg | ORAL_TABLET | Freq: Once | ORAL | Status: AC

## 2020-02-04 MED ORDER — THROMBIN 5000 UNITS EX SOLR
CUTANEOUS | Status: DC | PRN
  Administered 2020-02-04: 5000 [IU] via TOPICAL

## 2020-02-04 MED ORDER — CHLORHEXIDINE GLUCONATE 0.12 % MT SOLN
OROMUCOSAL | Status: AC
  Administered 2020-02-04: 15 mL via OROMUCOSAL
  Filled 2020-02-04: qty 15

## 2020-02-04 MED ORDER — BISACODYL 5 MG PO TBEC
5.0000 mg | DELAYED_RELEASE_TABLET | Freq: Every day | ORAL | Status: DC | PRN
  Filled 2020-02-04: qty 1

## 2020-02-04 MED ORDER — FAMOTIDINE 20 MG PO TABS
ORAL_TABLET | ORAL | Status: AC
  Administered 2020-02-04: 20 mg via ORAL
  Filled 2020-02-04: qty 1

## 2020-02-04 MED ORDER — REMIFENTANIL HCL 1 MG IV SOLR
INTRAVENOUS | Status: AC
  Filled 2020-02-04: qty 1000

## 2020-02-04 MED ORDER — ONDANSETRON HCL 4 MG/2ML IJ SOLN: 4.0000 mg | Freq: Four times a day (QID) | INTRAMUSCULAR | Status: DC | PRN

## 2020-02-04 MED ORDER — SODIUM CHLORIDE 0.9 % IV SOLN
INTRAVENOUS | Status: DC | PRN
  Administered 2020-02-04: 60 mL

## 2020-02-04 MED ORDER — SENNA 8.6 MG PO TABS
2.0000 | ORAL_TABLET | Freq: Two times a day (BID) | ORAL | Status: DC
  Administered 2020-02-04 – 2020-02-05 (×2): 17.2 mg via ORAL
  Filled 2020-02-04 (×3): qty 2

## 2020-02-04 MED ORDER — METHOCARBAMOL 750 MG PO TABS
ORAL_TABLET | ORAL | Status: AC
  Filled 2020-02-04: qty 1

## 2020-02-04 MED ORDER — DOCUSATE SODIUM 100 MG PO CAPS
100.0000 mg | ORAL_CAPSULE | Freq: Two times a day (BID) | ORAL | Status: DC
  Administered 2020-02-04 – 2020-02-05 (×2): 100 mg via ORAL
  Filled 2020-02-04 (×3): qty 1

## 2020-02-04 MED ORDER — FENTANYL CITRATE (PF) 100 MCG/2ML IJ SOLN: 25.0000 ug | INTRAMUSCULAR | Status: DC | PRN

## 2020-02-04 MED ORDER — ONDANSETRON HCL 4 MG PO TABS: 4.0000 mg | ORAL_TABLET | Freq: Four times a day (QID) | ORAL | Status: DC | PRN

## 2020-02-04 MED ORDER — DEXAMETHASONE SODIUM PHOSPHATE 10 MG/ML IJ SOLN
INTRAMUSCULAR | Status: DC | PRN
  Administered 2020-02-04: 5 mg via INTRAVENOUS

## 2020-02-04 MED ORDER — KETOROLAC TROMETHAMINE 30 MG/ML IJ SOLN
INTRAMUSCULAR | Status: DC | PRN
  Administered 2020-02-04: 30 mg via INTRAVENOUS

## 2020-02-04 MED ORDER — ACETAMINOPHEN 10 MG/ML IV SOLN
INTRAVENOUS | Status: DC | PRN
  Administered 2020-02-04: 1000 mg via INTRAVENOUS

## 2020-02-04 MED ORDER — SEVOFLURANE IN SOLN
RESPIRATORY_TRACT | Status: AC
  Filled 2020-02-04: qty 250

## 2020-02-04 MED ORDER — PHENYLEPHRINE HCL-NACL 20-0.9 MG/250ML-% IV SOLN
INTRAVENOUS | Status: DC | PRN
  Administered 2020-02-04: 40 ug/min via INTRAVENOUS

## 2020-02-04 MED ORDER — SODIUM CHLORIDE 0.9% FLUSH
3.0000 mL | Freq: Two times a day (BID) | INTRAVENOUS | Status: DC
  Administered 2020-02-04: 3 mL via INTRAVENOUS

## 2020-02-04 MED ORDER — METHYLPREDNISOLONE ACETATE 40 MG/ML IJ SUSP
INTRAMUSCULAR | Status: DC | PRN
  Administered 2020-02-04: 40 mg

## 2020-02-04 MED ORDER — PROPOFOL 10 MG/ML IV BOLUS
INTRAVENOUS | Status: AC
  Filled 2020-02-04: qty 40

## 2020-02-04 MED ORDER — METHOCARBAMOL 1000 MG/10ML IJ SOLN
500.0000 mg | Freq: Four times a day (QID) | INTRAVENOUS | Status: DC
  Filled 2020-02-04: qty 5

## 2020-02-04 MED ORDER — MENTHOL 3 MG MT LOZG
1.0000 | LOZENGE | OROMUCOSAL | Status: DC | PRN
  Filled 2020-02-04: qty 9

## 2020-02-04 MED ORDER — HYDRALAZINE HCL 20 MG/ML IJ SOLN: 10.0000 mg | Freq: Four times a day (QID) | INTRAMUSCULAR | Status: DC | PRN

## 2020-02-04 MED ORDER — AMLODIPINE BESYLATE 10 MG PO TABS
10.0000 mg | ORAL_TABLET | Freq: Every day | ORAL | Status: DC
  Administered 2020-02-05: 10 mg via ORAL
  Filled 2020-02-04: qty 1

## 2020-02-04 MED ORDER — PRAVASTATIN SODIUM 10 MG PO TABS
10.0000 mg | ORAL_TABLET | Freq: Every day | ORAL | Status: DC
  Filled 2020-02-04: qty 1

## 2020-02-04 MED ORDER — METHOCARBAMOL 750 MG PO TABS
ORAL_TABLET | ORAL | Status: AC
  Administered 2020-02-04: 750 mg via ORAL
  Filled 2020-02-04: qty 1

## 2020-02-04 MED ORDER — SUCCINYLCHOLINE CHLORIDE 20 MG/ML IJ SOLN
INTRAMUSCULAR | Status: DC | PRN
  Administered 2020-02-04: 100 mg via INTRAVENOUS

## 2020-02-04 SURGICAL SUPPLY — 61 items
BLADE BOVIE TIP EXT 4 (BLADE) ×2 IMPLANT
BUR NEURO DRILL SOFT 3.0X3.8M (BURR) ×2 IMPLANT
CANISTER SUCT 1200ML W/VALVE (MISCELLANEOUS) ×2 IMPLANT
CHLORAPREP W/TINT 26 (MISCELLANEOUS) ×2 IMPLANT
CNTNR SPEC 2.5X3XGRAD LEK (MISCELLANEOUS) ×1
CONT SPEC 4OZ STER OR WHT (MISCELLANEOUS) ×1
CONT SPEC 4OZ STRL OR WHT (MISCELLANEOUS) ×1
CONTAINER SPEC 2.5X3XGRAD LEK (MISCELLANEOUS) ×1 IMPLANT
COUNTER NEEDLE 20/40 LG (NEEDLE) ×2 IMPLANT
COVER LIGHT HANDLE STERIS (MISCELLANEOUS) ×8 IMPLANT
COVER WAND RF STERILE (DRAPES) ×2 IMPLANT
DERMABOND ADVANCED (GAUZE/BANDAGES/DRESSINGS) ×1
DERMABOND ADVANCED .7 DNX12 (GAUZE/BANDAGES/DRESSINGS) ×1 IMPLANT
DRAPE C-ARM 42X70 (DRAPES) ×8 IMPLANT
DRAPE C-ARM 42X72 X-RAY (DRAPES) ×4 IMPLANT
DRAPE LAPAROTOMY 100X77 ABD (DRAPES) ×2 IMPLANT
DRAPE MICROSCOPE SPINE 48X150 (DRAPES) IMPLANT
DRAPE SURG 17X11 SM STRL (DRAPES) ×4 IMPLANT
DRSG TEGADERM 4X4.75 (GAUZE/BANDAGES/DRESSINGS) ×4 IMPLANT
DRSG TELFA 3X8 NADH (GAUZE/BANDAGES/DRESSINGS) ×2 IMPLANT
DURASEAL APPLICATOR TIP (TIP) IMPLANT
DURASEAL SPINE SEALANT 3ML (MISCELLANEOUS) IMPLANT
ELECT CAUTERY BLADE TIP 2.5 (TIP) ×2
ELECT EZSTD 165MM 6.5IN (MISCELLANEOUS) ×2
ELECT REM PT RETURN 9FT ADLT (ELECTROSURGICAL) ×2
ELECTRODE CAUTERY BLDE TIP 2.5 (TIP) ×1 IMPLANT
ELECTRODE EZSTD 165MM 6.5IN (MISCELLANEOUS) ×1 IMPLANT
ELECTRODE REM PT RTRN 9FT ADLT (ELECTROSURGICAL) ×1 IMPLANT
GAUZE SPONGE 4X4 12PLY STRL (GAUZE/BANDAGES/DRESSINGS) ×2 IMPLANT
GLOVE INDICATOR 7.0 STRL GRN (GLOVE) ×2 IMPLANT
GLOVE SRG 8 PF TXTR STRL LF DI (GLOVE) ×1 IMPLANT
GLOVE SURG ENC MOIS LTX SZ6.5 (GLOVE) ×2 IMPLANT
GLOVE SURG SYN 7.0 (GLOVE) ×14 IMPLANT
GLOVE SURG SYN 8.0 (GLOVE) ×4 IMPLANT
GLOVE SURG UNDER POLY LF SZ7 (GLOVE) ×4 IMPLANT
GLOVE SURG UNDER POLY LF SZ8 (GLOVE) ×2
GOWN STRL REUS W/ TWL XL LVL3 (GOWN DISPOSABLE) ×2 IMPLANT
GOWN STRL REUS W/TWL XL LVL3 (GOWN DISPOSABLE) ×4
GRADUATE 1200CC STRL 31836 (MISCELLANEOUS) ×2 IMPLANT
HEMOVAC 400CC 10FR (MISCELLANEOUS) ×2 IMPLANT
KIT TURNOVER KIT A (KITS) ×2 IMPLANT
KIT WILSON FRAME (KITS) ×2 IMPLANT
MANIFOLD NEPTUNE II (INSTRUMENTS) ×2 IMPLANT
MARKER SKIN DUAL TIP RULER LAB (MISCELLANEOUS) ×4 IMPLANT
NDL SAFETY ECLIPSE 18X1.5 (NEEDLE) ×1 IMPLANT
NEEDLE HYPO 18GX1.5 SHARP (NEEDLE) ×2
NEEDLE HYPO 22GX1.5 SAFETY (NEEDLE) ×2 IMPLANT
NS IRRIG 1000ML POUR BTL (IV SOLUTION) ×2 IMPLANT
PACK LAMINECTOMY NEURO (CUSTOM PROCEDURE TRAY) ×2 IMPLANT
PAD ARMBOARD 7.5X6 YLW CONV (MISCELLANEOUS) ×2 IMPLANT
SPOGE SURGIFLO 8M (HEMOSTASIS) ×2
SPONGE DRAIN TRACH 4X4 STRL 2S (GAUZE/BANDAGES/DRESSINGS) ×2 IMPLANT
SPONGE SURGIFLO 8M (HEMOSTASIS) ×1 IMPLANT
SUT NURALON 4 0 TR CR/8 (SUTURE) IMPLANT
SUT POLYSORB 2-0 5X18 GS-10 (SUTURE) ×8 IMPLANT
SUT VIC AB 0 CT1 18XCR BRD 8 (SUTURE) ×2 IMPLANT
SUT VIC AB 0 CT1 8-18 (SUTURE) ×4
SYR 20ML LL LF (SYRINGE) ×2 IMPLANT
TOWEL OR 17X26 4PK STRL BLUE (TOWEL DISPOSABLE) ×6 IMPLANT
TRAY FOLEY MTR SLVR 16FR STAT (SET/KITS/TRAYS/PACK) IMPLANT
TUBING CONNECTING 10 (TUBING) ×2 IMPLANT

## 2020-02-04 NOTE — Plan of Care (Signed)
Post surgery care started.  Reviewed: needs to call for assistance and not to get OOB by self.

## 2020-02-04 NOTE — Interval H&P Note (Signed)
History and Physical Interval Note:  02/04/2020 1:27 PM  Jessica Norman  has presented today for surgery, with the diagnosis of lumbar stenosis.  The various methods of treatment have been discussed with the patient and family. After consideration of risks, benefits and other options for treatment, the patient has consented to  Procedure(s): L2-4 OPEN LAMINECTOMY, RIGHT L5/S1 HEMILAMINECTOMY (Right) as a surgical intervention.  The patient's history has been reviewed, patient examined, no change in status, stable for surgery.  I have reviewed the patient's chart and labs.  Questions were answered to the patient's satisfaction.    CV: Regular rate and rhythm Pulm: Clear to auscultation   Lucy Chris

## 2020-02-04 NOTE — H&P (Signed)
Jessica Norman is an 70 y.o. female.   Chief Complaint: Right leg pain, urinary incontinence HPI: Jessica Norman is here for evaluation of ongoing symptoms of right leg pain as well as urinary incontinence that she states has been going on for the past few months after a fall. She has had some repeated falls since then she does feel like the symptoms have persisted. She was referred to physiatry but was also found on MRI to have concern for subacute compression fractures. Given this, no steroid injection was pursued. Today, she states the pain will go down the back of the right leg towards the big toe. She does have some numbness intermittently in that toe. She denies any persistent left leg symptoms. She describes urinary incontinence as happening whenever she tries to rise to a standing position and she does note some pain with doing so. She does feel the sensation. She does use a cane for ambulation. MRI also showed severe stenosis at L2/3 and L3/4 as well as stenosis on right at L5/S1. We discussed decompression and she wishes to proceed.     Past Medical History:  Diagnosis Date  . Arthritis   . Asthma   . Bipolar disorder (HCC)   . GERD (gastroesophageal reflux disease)   . Hearing loss   . History of hiatal hernia 2011   treated with medication, no surgical repair  . History of Sjogren's disease (HCC)   . HLD (hyperlipidemia)   . Hypertension   . Hypothyroidism   . Incomplete bladder emptying   . Memory loss   . Obesity   . Recurrent UTI   . Vision abnormalities     Past Surgical History:  Procedure Laterality Date  . ABDOMINAL HYSTERECTOMY    . BACK SURGERY    . CARPAL TUNNEL RELEASE Right   . COLONOSCOPY    . EXCISION OF TONGUE LESION N/A 12/19/2014   Procedure: EXCISION OF TONGUE LESION;  Surgeon: Bud Face, MD;  Location: [facility] ORS;  Service: ENT;  Laterality: N/A;  . EYE SURGERY     cataracts  . FOOT SURGERY    . NECK SURGERY    . SHOULDER ARTHROSCOPY WITH OPEN  ROTATOR CUFF REPAIR Left 01/16/2015   Procedure: SHOULDER ARTHROSCOPY WITH mini OPEN ROTATOR CUFF REPAIR,subacromial decompression, abrasion chondrop;asty with microfracture of glenoid, excision distal clavicle, biceps tenodesis, extensive debridement;  Surgeon: Christena Flake, MD;  Location: [facility] ORS;  Service: Orthopedics;  Laterality: Left;  . TOTAL KNEE ARTHROPLASTY Left 07/26/2017   Procedure: TOTAL KNEE ARTHROPLASTY;  Surgeon: Christena Flake, MD;  Location: [facility] ORS;  Service: Orthopedics;  Laterality: Left;  Marland Kitchen VESICOVAGINAL FISTULA CLOSURE W/ TAH      Family History  Problem Relation Age of Onset  . Diabetes Mother   . Stroke Mother   . COPD Father   . Tuberculosis Father   . Kidney disease Neg Hx   . Bladder Cancer Neg Hx    Social History:  reports that she quit smoking about 55 years ago. She has never used smokeless tobacco. She reports that she does not drink alcohol and does not use drugs.  Allergies:  Allergies  Allergen Reactions  . Codeine Itching  . Pantoprazole Itching     Throat and chest tightness  . Penicillin G Itching    Has patient had a PCN reaction causing immediate rash, facial/tongue/throat swelling, SOB or lightheadedness with hypotension: No Has patient had a PCN reaction causing severe rash involving mucus membranes or skin  necrosis: No Has patient had a PCN reaction that required hospitalization: No Has patient had a PCN reaction occurring within the last 10 years: No If all of the above answers are "NO", then may proceed with Cephalosporin use.   . Sulfa Antibiotics Itching    Medications Prior to Admission  Medication Sig Dispense Refill  . acetaminophen (TYLENOL) 650 MG CR tablet Take 1,300 mg by mouth every 8 (eight) hours as needed for pain.    Marland Kitchen aluminum hydroxide-magnesium carbonate (GAVISCON) 95-358 MG/15ML SUSP Take 15-30 mLs by mouth every 6 (six) hours as needed for heartburn or indigestion.    Marland Kitchen amLODipine (NORVASC) 10 MG tablet Take 10 mg  by mouth daily.     . ASPIRIN 81 PO Take 1 tablet by mouth daily.    . carvedilol (COREG) 6.25 MG tablet Take 6.25 mg by mouth 2 (two) times daily.    . cycloSPORINE (RESTASIS) 0.05 % ophthalmic emulsion Place 650 drops into both eyes 2 (two) times daily.    . diclofenac Sodium (VOLTAREN) 1 % GEL Apply 1 application topically 4 (four) times daily as needed (pain).    Marland Kitchen divalproex (DEPAKOTE ER) 500 MG 24 hr tablet Take 1,000 mg by mouth at bedtime.    . fexofenadine (ALLEGRA) 180 MG tablet Take 180 mg by mouth daily as needed for allergies.    Marland Kitchen HYDROcodone-acetaminophen (NORCO/VICODIN) 5-325 MG tablet Take 1 tablet by mouth every 6 (six) hours as needed for moderate pain.    . hydroxychloroquine (PLAQUENIL) 200 MG tablet Take 400 mg by mouth daily.    Marland Kitchen levothyroxine (SYNTHROID) 50 MCG tablet Take 50 mcg by mouth daily before breakfast.    . lovastatin (MEVACOR) 10 MG tablet Take 10 mg by mouth daily with supper.    . Misc Natural Products (OSTEO BI-FLEX TRIPLE STRENGTH) TABS Take 2 tablets by mouth daily.    . Multiple Vitamin (MULTIVITAMIN WITH MINERALS) TABS tablet Take 1 tablet by mouth daily.    . sertraline (ZOLOFT) 100 MG tablet Take 100 mg by mouth daily.     . sulindac (CLINORIL) 200 MG tablet Take 200 mg by mouth daily.      No results found for this or any previous visit (from the past 48 hour(s)). No results found.  Review of Systems General ROS: Negative Psychological ROS: Negative Ophthalmic ROS: Negative ENT ROS: Negative Hematological and Lymphatic ROS: Negative  Endocrine ROS: Negative Respiratory ROS: Negative Cardiovascular ROS: Negative Gastrointestinal ROS: Negative Genito-Urinary ROS: Positive for intermittent urinary incontinence Musculoskeletal ROS: Positive for back pain Neurological ROS: Positive for leg pain Dermatological ROS: Negative   Blood pressure (!) 182/81, pulse 61, temperature 97.8 F (36.6 C), temperature source Oral, resp. rate 16, height 5'  8" (1.727 m), weight 96.6 kg, SpO2 100 %. Physical Exam  General appearance: Alert, cooperative, in no acute distress Head: Normocephalic, atraumatic Eyes: Normal, EOM intact Oropharynx: Wearing facemask Back: Well-healed midline incision Ext: Mild edema noted in lower extremities  Neurologic exam:  Mental status: alertness: alert, affect: normal Speech: fluent and clear Motor:strength symmetric 5/5 in bilateral hip flexion, knee flexion, knee extension, dorsi, plantar flexion Sensory: intact to light touch in bilateral lower extremities with exception of decrease over the right big toe Gait: Arrives in wheelchair  MRI lumbar spine: There is a normal lordotic curvature. Alignment appears maintained. There are subacute fractures noted at L1 and L3. There is minimal loss of height at these levels. There is severe degenerative disease noted from L4-S1. There  appears to be laminectomy defects noted at the L4-5 level. There is severe central stenosis noted at L2-3 and L3-4 due to disc osteophyte complexes and hypertrophied ligamentum flavum.   Assessment/Plan Plan to proceed with L2/3 and L3/4 laminectomy as well as right L5/S1 hemilaminectomy  Lucy Chris, MD 02/04/2020, 1:24 PM

## 2020-02-04 NOTE — Transfer of Care (Signed)
Immediate Anesthesia Transfer of Care Note  Patient: Jessica Norman  Procedure(s) Performed: L2-4 OPEN LAMINECTOMY, RIGHT L5/S1 HEMILAMINECTOMY (Right )  Patient Location: PACU  Anesthesia Type:General  Level of Consciousness: drowsy  Airway & Oxygen Therapy: Patient Spontanous Breathing and Patient connected to face mask oxygen  Post-op Assessment: Report given to RN and Post -op Vital signs reviewed and stable  Post vital signs: Reviewed and stable  Last Vitals:  Vitals Value Taken Time  BP 170/82   Temp    Pulse 81   Resp 14   SpO2 96     Last Pain:  Vitals:   02/04/20 1138  TempSrc: Oral  PainSc: 4       Patients Stated Pain Goal: 3 (02/04/20 1138)  Complications: No complications documented.

## 2020-02-04 NOTE — Consult Note (Signed)
Pharmacy Antibiotic Note  Jessica Norman is a 70 y.o. female admitted on (Not on file) with surgical prophylaxis.  Pharmacy has been consulted for cefazolin dosing.  Plan: Cefazolin 2g IV x 1 (no history anaphylaxis to PCN)  Please contact pharmacy if additional dosing is needed   CrCl cannot be calculated (Unknown ideal weight.).    Allergies  Allergen Reactions  . Codeine Itching  . Pantoprazole Itching     Throat and chest tightness  . Penicillin G Itching    Has patient had a PCN reaction causing immediate rash, facial/tongue/throat swelling, SOB or lightheadedness with hypotension: No Has patient had a PCN reaction causing severe rash involving mucus membranes or skin necrosis: No Has patient had a PCN reaction that required hospitalization: No Has patient had a PCN reaction occurring within the last 10 years: No If all of the above answers are "NO", then may proceed with Cephalosporin use.   . Sulfa Antibiotics Itching     Thank you for allowing pharmacy to be a part of this patient's care.  Albina Billet, PharmD, BCPS Clinical Pharmacist 02/04/2020 10:28 AM

## 2020-02-04 NOTE — Anesthesia Procedure Notes (Signed)
Procedure Name: Intubation Date/Time: 02/04/2020 2:15 PM Performed by: Clyde Lundborg, CRNA Pre-anesthesia Checklist: Patient identified, Emergency Drugs available, Suction available and Patient being monitored Patient Re-evaluated:Patient Re-evaluated prior to induction Oxygen Delivery Method: Circle system utilized Preoxygenation: Pre-oxygenation with 100% oxygen Induction Type: IV induction Ventilation: Mask ventilation without difficulty Laryngoscope Size: McGraph and 3 Grade View: Grade II Tube type: Oral Tube size: 7.0 mm Number of attempts: 1 Airway Equipment and Method: Video-laryngoscopy Placement Confirmation: ETT inserted through vocal cords under direct vision,  positive ETCO2,  breath sounds checked- equal and bilateral and CO2 detector Secured at: 21 cm Tube secured with: Tape Dental Injury: Teeth and Oropharynx as per pre-operative assessment

## 2020-02-04 NOTE — Anesthesia Preprocedure Evaluation (Addendum)
Anesthesia Evaluation  Patient identified by MRN, date of birth, ID band Patient awake    Reviewed: Allergy & Precautions, H&P , NPO status , Patient's Chart, lab work & pertinent test results  History of Anesthesia Complications Negative for: history of anesthetic complications  Airway Mallampati: III  TM Distance: >3 FB Neck ROM: limited    Dental  (+) Chipped, Poor Dentition, Partial Lower   Pulmonary neg shortness of breath, asthma , former smoker,    Pulmonary exam normal        Cardiovascular Exercise Tolerance: Good hypertension, (-) angina(-) Past MI Normal cardiovascular exam     Neuro/Psych  Headaches, PSYCHIATRIC DISORDERS Bipolar Disorder  Neuromuscular disease    GI/Hepatic Neg liver ROS, hiatal hernia, GERD  Medicated and Controlled,  Endo/Other  Hypothyroidism   Renal/GU      Musculoskeletal  (+) Arthritis , Osteoarthritis,    Abdominal Normal abdominal exam  (+)   Peds  Hematology negative hematology ROS (+)   Anesthesia Other Findings Past Medical History: No date: Arthritis No date: Asthma No date: Bipolar disorder (HCC) No date: GERD (gastroesophageal reflux disease) No date: Hearing loss 2011: History of hiatal hernia     Comment:  treated with medication, no surgical repair No date: History of Sjogren's disease No date: HLD (hyperlipidemia) No date: Hypertension No date: Hypothyroidism No date: Incomplete bladder emptying No date: Memory loss No date: Obesity No date: Recurrent UTI No date: Vision abnormalities  Past Surgical History: No date: ABDOMINAL HYSTERECTOMY No date: BACK SURGERY No date: CARPAL TUNNEL RELEASE; Right 12/19/2014: EXCISION OF TONGUE LESION; N/A     Comment:  Procedure: EXCISION OF TONGUE LESION;  Surgeon:               Bud Face, MD;  Location: [facility] ORS;  Service: ENT;              Laterality: N/A; No date: EYE SURGERY     Comment:  cataracts No  date: FOOT SURGERY No date: NECK SURGERY 01/16/2015: SHOULDER ARTHROSCOPY WITH OPEN ROTATOR CUFF REPAIR; Left     Comment:  Procedure: SHOULDER ARTHROSCOPY WITH mini OPEN ROTATOR               CUFF REPAIR,subacromial decompression, abrasion               chondrop;asty with microfracture of glenoid, excision               distal clavicle, biceps tenodesis, extensive debridement;              Surgeon: Christena Flake, MD;  Location: [facility] ORS;  Service:              Orthopedics;  Laterality: Left; No date: VESICOVAGINAL FISTULA CLOSURE W/ TAH  BMI    Body Mass Index:  32.31 kg/m      Reproductive/Obstetrics negative OB ROS                             Anesthesia Physical  Anesthesia Plan  ASA: III  Anesthesia Plan: General   Post-op Pain Management:    Induction: Intravenous  PONV Risk Score and Plan:   Airway Management Planned: Oral ETT  Additional Equipment:   Intra-op Plan:   Post-operative Plan:   Informed Consent: I have reviewed the patients History and Physical, chart, labs and discussed the procedure including the risks, benefits and alternatives for the proposed anesthesia with the patient or  authorized representative who has indicated his/her understanding and acceptance.     Dental Advisory Given  Plan Discussed with: Anesthesiologist, CRNA and Surgeon  Anesthesia Plan Comments: (Patient reports no bleeding problems and no anticoagulant use.  Plan for spinal with backup GA  Patient consented for risks of anesthesia including but not limited to:  - adverse reactions to medications - risk of bleeding, infection, nerve damage and headache - risk of failed spinal - damage to teeth, lips or other oral mucosa - sore throat or hoarseness - Damage to heart, brain, lungs or loss of life  Patient voiced understanding.)        Anesthesia Quick Evaluation

## 2020-02-05 ENCOUNTER — Encounter: Payer: Self-pay | Admitting: Neurosurgery

## 2020-02-05 DIAGNOSIS — M48061 Spinal stenosis, lumbar region without neurogenic claudication: Secondary | ICD-10-CM | POA: Diagnosis not present

## 2020-02-05 MED ORDER — CYCLOSPORINE 0.05 % OP EMUL: 1.0000 [drp] | Freq: Two times a day (BID) | OPHTHALMIC | 0 refills | Status: DC

## 2020-02-05 MED ORDER — CARVEDILOL 12.5 MG PO TABS
ORAL_TABLET | ORAL | Status: AC
  Administered 2020-02-05: 6.25 mg via ORAL
  Filled 2020-02-05: qty 1

## 2020-02-05 MED ORDER — FEMALE URINARY POUCH MISC: 0 refills | Status: DC

## 2020-02-05 MED ORDER — OXYCODONE HCL 5 MG PO TABS
ORAL_TABLET | ORAL | Status: AC
  Administered 2020-02-05: 5 mg via ORAL
  Filled 2020-02-05: qty 1

## 2020-02-05 MED ORDER — ACETAMINOPHEN 500 MG PO TABS
ORAL_TABLET | ORAL | Status: AC
  Administered 2020-02-05: 1000 mg via ORAL
  Filled 2020-02-05: qty 2

## 2020-02-05 MED ORDER — METHOCARBAMOL 750 MG PO TABS
ORAL_TABLET | ORAL | Status: AC
  Administered 2020-02-05: 750 mg via ORAL
  Filled 2020-02-05: qty 1

## 2020-02-05 NOTE — Op Note (Signed)
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)  Neurogenic claudication  POST-OP DIAGNOSIS: Post-Op Diagnosis Codes:  Lumbar Stenosis with Lumbar Radiculopathy (m48.062)  Neurogenic claudication  Procedure(s) with comments:  L2/3 and L3/4 laminectomy Right L5/S1 Hemilaminectomy and Foraminotomy  SURGEON:  * Nathaniel Man, MD  Patsey Berthold, NP Asst  ANESTHESIA: General   OPERATIVE FINDINGS: Stenosis due to ligament hypertrophy and facet overgrowth from L2-3 to L3-4 Stenosis on the right at L5-S1 due to facet overgrowth  OPERATIVE REPORT:  Indication:  Jessica Norman presented to the clinic on 1/4 with ongoing bilateral leg symptoms including numbness, right leg pain, and urinary issues.  She was found on imaging to have stenosis from L2-3 to L3-4 as well as on the right at L5-S1. Given previously failed conservative management including OTC medications and precription medications we discussed need for decompression at multiple levels to relieve the pressure on the thecal sac and nerves.  She was deemed not to be a candidate for steroid injections. The risks of surgery were explained to include hematoma, infection, damage to nerve roots, CSF leak, weakness, numbness, pain,  need for future surgery, heart attack, and stroke.  She elected to proceed with surgery for symptom relief.   Procedure The patient was brought to the OR after informed consent was obtained.  She was given general anesthesia and intubated by the anesthesia service. Vascular access lines were placed.The patient was then placed prone on a Wilson frameensuring all pressure points were padded. A time-out was performed per protocol.  Fluoroscopy confirmed levels for planned midline incision  The patient was sterilely prepped and draped. The previous incision was instilled withlocal anesthetic with epinephrine. The skin was opened sharply and the dissection taken to  the fascia. This was incised and cautery was used to dissect the subperiosteal plane to expose the spinous processes and lamina of L2 to L4. Dissection was continued laterally in order to expose the medial facets at the L2-L4 levelsbilaterally. Retractors were inserted and hemostasis was achieved. X-ray was used to confirm location at the L3 lamina  Next, a drill was used to remove the entire L3 lamina centrally.  The caudal half of L2 lamina as well as the rostral part of L5 was also removed. The underlying ligament was freed and removed with combination of rongeurs.. Next attention was turned to the lateral stenosis, starting at the L2-3 level. This continued along the left side until we were at the level of the L4 pedicle. Care was taken not to injure the dura. The right side was treated in a similar fashion.  A curette was used to inspect the lateral recesses ensuring all ligament was removed. Once the dura appeared free from all the bone edges and all soft tissue compression was removed, hemostasis was obtained.   Next, we turned our attention to the decompression on the right at L5-S1.  The lamina of L5 and S1 were exposed and fluoroscopy confirmed the appropriate level.  Next, the drill was used to remove the inferior portion of the L5 lamina going out into the facet.  Medial facetectomy was performed.  The ligament was exposed and removed with curettes and rongeurs.  Once the dura was exposed, this was followed laterally to ensure the traversing and exiting nerve roots were free of compression.  Once all soft tissue compression was removed, the space was irrigated free of debris.  Depo-Medrol was placed along the nerve root and hemostasis was obtained.  A drain was  placed beneath the muscle and exited to the skin.  The muscle was then closed using 0 vicryl followed by the fascia with 0 vicryl.   Next, multiple subcutaneous and dermal layers were closed with 2-0 vicryl until the epidermis was well  approximated. The skin was closed with staples. A dressing was applied.  The patient was returned to supine position and extubated by the anesthesia service. The patient was then taken to the PACU for post-operative care where she was moving extremities symmetrically.     ESTIMATED BLOOD LOSS:  100 cc  SPECIMENS  None    I performed the case in its entirety with assistance of NP, christine Zdeb Jessica Chris, MD  [phone]

## 2020-02-05 NOTE — OR Nursing (Signed)
Patient states she no longer feels like she can control urine flow.

## 2020-02-05 NOTE — Anesthesia Postprocedure Evaluation (Signed)
Anesthesia Post Note  Patient: Jessica Norman  Procedure(s) Performed: L2-4 OPEN LAMINECTOMY, RIGHT L5/S1 HEMILAMINECTOMY (Right )  Patient location during evaluation: PACU Anesthesia Type: General Level of consciousness: awake and alert Pain management: pain level controlled Vital Signs Assessment: post-procedure vital signs reviewed and stable Respiratory status: spontaneous breathing, nonlabored ventilation and respiratory function stable Cardiovascular status: blood pressure returned to baseline and stable Postop Assessment: no apparent nausea or vomiting Anesthetic complications: no   No complications documented.   Last Vitals:  Vitals:   02/04/20 1952 02/04/20 2100  BP: (!) 191/74 (!) 165/75  Pulse: 66   Resp: 16 16  Temp:  36.7 C  SpO2: 100%     Last Pain:  Vitals:   02/04/20 2115  TempSrc:   PainSc: 2                  Karleen Hampshire

## 2020-02-05 NOTE — Discharge Summary (Signed)
Physician Discharge Summary  Patient ID: Jessica Norman MRN: [id number] DOB/AGE: March 29, 1950 70 y.o.  Admit date: 02/04/2020 Discharge date: 02/05/2020  Admission Diagnoses: Lumbar stenosis with neurogenic claudication and radiculopathy  Discharge Diagnoses: Lumbar stenosis with neurogenic claudication and radiculopathy resolved Active Problems:   Lumbar stenosis   Discharged Condition: Good  Hospital Course: Jessica Norman presented to the hospital 02/04/2020 and underwent planned procedure of a L2-4 laminectomy and a right L5-S1 hemilaminectomy.  Postoperatively she had a drain placed for drainage and was taken to the ward for recovery.  There, she was seen to be at her neurologic baseline.  She continued to have some episodes of urinary incontinence which are chronic.  She had no new symptoms.  On postoperative day 1 she was seen in the tolerated diet.  Her pain was well controlled and she was having no significant leg symptoms.  Her drain had put out 40 cc and was removed.  Given this, she was appropriate for discharge at this time and she was discharged home.   Discharge Exam: Blood pressure (!) 106/54, pulse 65, temperature 97.7 F (36.5 C), temperature source Temporal, resp. rate 16, height 5\' 8"  (1.727 m), weight 96.6 kg, SpO2 98 %. She has 5-5 strength in bilateral lower extremities at hip flexion, knee flexion, knee extension, dorsiflexion, plantarflexion. Sensation was intact to light touch throughout lower extremities Drain was removed Dressing with minimal staining  Disposition: Home   Allergies as of 02/05/2020      Reactions   Codeine Itching   Pantoprazole Itching   Throat and chest tightness   Penicillin G Itching   Has patient had a PCN reaction causing immediate rash, facial/tongue/throat swelling, SOB or lightheadedness with hypotension: No Has patient had a PCN reaction causing severe rash involving mucus membranes or skin necrosis: No Has patient had a PCN reaction  that required hospitalization: No Has patient had a PCN reaction occurring within the last 10 years: No If all of the above answers are "NO", then may proceed with Cephalosporin use.   Sulfa Antibiotics Itching      Medication List    STOP taking these medications   HYDROcodone-acetaminophen 5-325 MG tablet Commonly known as: NORCO/VICODIN     TAKE these medications   acetaminophen 650 MG CR tablet Commonly known as: TYLENOL Take 1,300 mg by mouth every 8 (eight) hours as needed for pain.   aluminum hydroxide-magnesium carbonate 95-358 MG/15ML Susp Commonly known as: GAVISCON Take 15-30 mLs by mouth every 6 (six) hours as needed for heartburn or indigestion.   amLODipine 10 MG tablet Commonly known as: NORVASC Take 10 mg by mouth daily.   ASPIRIN 81 PO Take 1 tablet by mouth daily.   carvedilol 6.25 MG tablet Commonly known as: COREG Take 6.25 mg by mouth 2 (two) times daily.   cycloSPORINE 0.05 % ophthalmic emulsion Commonly known as: RESTASIS Place 1 drop into both eyes 2 (two) times daily. What changed: how much to take   diclofenac Sodium 1 % Gel Commonly known as: VOLTAREN Apply 1 application topically 4 (four) times daily as needed (pain).   divalproex 500 MG 24 hr tablet Commonly known as: DEPAKOTE ER Take 1,000 mg by mouth at bedtime.   Female Urinary Pouch Misc Purewick external catheter   fexofenadine 180 MG tablet Commonly known as: ALLEGRA Take 180 mg by mouth daily as needed for allergies.   hydroxychloroquine 200 MG tablet Commonly known as: PLAQUENIL Take 400 mg by mouth daily.   levothyroxine 50  MCG tablet Commonly known as: SYNTHROID Take 50 mcg by mouth daily before breakfast.   lovastatin 10 MG tablet Commonly known as: MEVACOR Take 10 mg by mouth daily with supper.   multivitamin with minerals Tabs tablet Take 1 tablet by mouth daily.   Osteo Bi-Flex Triple Strength Tabs Take 2 tablets by mouth daily.   sertraline 100 MG  tablet Commonly known as: ZOLOFT Take 100 mg by mouth daily.   sulindac 200 MG tablet Commonly known as: CLINORIL Take 200 mg by mouth daily.        Signed: Lucy Chris 02/05/2020, 9:05 AM

## 2020-02-05 NOTE — Discharge Instructions (Signed)
NEUROSURGERY DISCHARGE INSTRUCTIONS  Admission diagnosis: Lumbar stenosis [M48.061]  Operative procedure: L2-3, L3-4 laminectomy, right L5-S1 hemilaminectomy  What to do after you leave the hospital:  Recommended diet: regular diet. Increase protein intake to promote wound healing.  Taking a multivitamin can help with wound healing.  Recommended activity: no heavy lifting, pushing, pulling for 6 weeks. No driving if taking pain medications  Special Instructions  No straining, no heavy lifting > 10lbs x 2 weeks.  Keep incision area clean and dry. Clean incision daily starting on 1/18 after you remove dressing.   May Shower daily.   No bath or soaking in tub for6weeks.  Staples to be removed in 21 days.   You have hydrocodone and methocarbamol ordered for pain.  Please take these only as needed and as prescribed.  They have been placed at your pharmacy, [facility] in [place].  You may take ibuprofen or Tylenol for daily pain.  You may use heat or ice around the incision.  Please take a stool softener if you are taking any prescription pain medication.  Please Report any of the following: Nausea or Vomiting, Temperature is greater than 101.56F (38.1C) degrees, Dizziness, Abdominal Pain, Difficulty Breathing or Shortness of Breath, Inability to Eat, drink Fluids, or Take medications, Bleeding, swelling, or drainage from surgical incision sites and New numbness or weakness to the neurosurgeon on-call at [phone]  Additional Follow up appointments Appointment with Dr. Adriana Simas at another clinic on 2/8

## 2020-06-18 ENCOUNTER — Other Ambulatory Visit: Payer: Self-pay | Admitting: Surgery

## 2020-06-20 ENCOUNTER — Encounter
Admission: RE | Admit: 2020-06-20 | Discharge: 2020-06-20 | Disposition: A | Discharge status: Home / Self Care | Payer: Medicare Other | Source: Ambulatory Visit | Attending: Surgery | Admitting: Surgery

## 2020-06-20 ENCOUNTER — Other Ambulatory Visit: Payer: Self-pay

## 2020-06-20 DIAGNOSIS — Z01818 Encounter for other preprocedural examination: Secondary | ICD-10-CM | POA: Insufficient documentation

## 2020-06-20 HISTORY — DX: Tinnitus, bilateral: H93.13

## 2020-06-20 LAB — CBC WITH DIFFERENTIAL/PLATELET
Abs Immature Granulocytes: 0.01 10*3/uL (ref 0.00–0.07)
Basophils Absolute: 0 10*3/uL (ref 0.0–0.1)
Basophils Relative: 1 %
Eosinophils Absolute: 0.3 10*3/uL (ref 0.0–0.5)
Eosinophils Relative: 7 %
HCT: 38.4 % (ref 36.0–46.0)
Hemoglobin: 12.6 g/dL (ref 12.0–15.0)
Immature Granulocytes: 0 %
Lymphocytes Relative: 14 %
Lymphs Abs: 0.6 10*3/uL — ABNORMAL LOW (ref 0.7–4.0)
MCH: 28.6 pg (ref 26.0–34.0)
MCHC: 32.8 g/dL (ref 30.0–36.0)
MCV: 87.3 fL (ref 80.0–100.0)
Monocytes Absolute: 0.6 10*3/uL (ref 0.1–1.0)
Monocytes Relative: 12 %
Neutro Abs: 2.9 10*3/uL (ref 1.7–7.7)
Neutrophils Relative %: 66 %
Platelets: 155 10*3/uL (ref 150–400)
RBC: 4.4 MIL/uL (ref 3.87–5.11)
RDW: 13.9 % (ref 11.5–15.5)
WBC: 4.4 10*3/uL (ref 4.0–10.5)
nRBC: 0 % (ref 0.0–0.2)

## 2020-06-20 LAB — TYPE AND SCREEN
ABO/RH(D): A POS
Antibody Screen: NEGATIVE

## 2020-06-20 LAB — COMPREHENSIVE METABOLIC PANEL
ALT: 12 U/L (ref 0–44)
AST: 20 U/L (ref 15–41)
Albumin: 3.5 g/dL (ref 3.5–5.0)
Alkaline Phosphatase: 45 U/L (ref 38–126)
Anion gap: 8 (ref 5–15)
BUN: 21 mg/dL (ref 8–23)
CO2: 28 mmol/L (ref 22–32)
Calcium: 9.2 mg/dL (ref 8.9–10.3)
Chloride: 105 mmol/L (ref 98–111)
Creatinine, Ser: 0.85 mg/dL (ref 0.44–1.00)
GFR, Estimated: >60 mL/min (ref >60)
Glucose, Bld: 81 mg/dL (ref 70–99)
Potassium: 4.2 mmol/L (ref 3.5–5.1)
Sodium: 141 mmol/L (ref 135–145)
Total Bilirubin: 0.7 mg/dL (ref 0.3–1.2)
Total Protein: 7.6 g/dL (ref 6.5–8.1)

## 2020-06-20 LAB — URINALYSIS, ROUTINE W REFLEX MICROSCOPIC
Bacteria, UA: NONE SEEN
Bilirubin Urine: NEGATIVE
Glucose, UA: NEGATIVE mg/dL
Hgb urine dipstick: NEGATIVE
Ketones, ur: NEGATIVE mg/dL
Leukocytes,Ua: NEGATIVE
Nitrite: NEGATIVE
Protein, ur: NEGATIVE mg/dL
Specific Gravity, Urine: 1.017 (ref 1.005–1.030)
pH: 7 (ref 5.0–8.0)

## 2020-06-20 LAB — SURGICAL PCR SCREEN
MRSA, PCR: NEGATIVE
Staphylococcus aureus: NEGATIVE

## 2020-06-20 NOTE — Patient Instructions (Addendum)
INSTRUCTIONS FOR SURGERY     Your surgery is scheduled for:   Tuesday, June 14TH     To find out your arrival time for the day of surgery,          please call [phone] between 1 pm and 3 pm on :  Monday, June 13TH     When you arrive for surgery, report to REGISTRATION ON THE FIRST FLOOR OF THE  MEDICAL MALL. ONCE THEY HAVE COMPLETED THEIR PROCESS, PLEASE PROCEED TO THE SECOND FLOOR AND SIGN IN AT THE SURGERY DESK.    REMEMBER: Instructions that are not followed completely may result in serious medical risk,  up to and including death, or upon the discretion of your surgeon and anesthesiologist,            your surgery may need to be rescheduled.  __X__ 1. Do not eat food after midnight the night before your procedure.                    No gum, candy, lozenger, tic tacs, tums or hard candies.                  ABSOLUTELY NOTHING SOLID IN YOUR MOUTH AFTER MIDNIGHT                    You may drink unlimited clear liquids up to 2 hours before you are scheduled to arrive for surgery.                   Do not drink anything within those 2 hours unless you need to take medicine, then take the                   smallest amount you need.  Clear liquids include:  water, apple juice without pulp,                   any flavor Gatorade, Black coffee, black tea.  Sugar may be added but no dairy/ honey /lemon.                        Broth and jello is not considered a clear liquid.  __x__  2. On the morning of surgery, please brush your teeth with toothpaste and water. You may rinse with                  mouthwash if you wish but DO NOT SWALLOW TOOTHPASTE OR MOUTHWASH  __X___3. NO alcohol for 24 hours before or after surgery.  __x___ 4.  Do NOT smoke or use e-cigarettes for 24 HOURS PRIOR TO SURGERY.                      DO NOT Use any chewable tobacco products for at least 6 hours prior to surgery.  __x___ 5. If you start any new  medication after this appointment and prior to surgery, please                   Bring it with you on the day of surgery.  ___x__ 6. Notify your  doctor if there is any change in your medical condition, such as fever,                   infection, vomitting, diarrhea or any open sores.  __x___ 7.  USE the CHG SOAP as instructed, the night before surgery and the day of surgery.                   Once you have washed with this soap, do NOT use any of the following: Powders, perfumes                    or lotions. Please do not wear make up, hairpins, clips or nail polish. You MAY wear deodorant.                                                     Women need to shave 48 hours prior to surgery.                  DO NOT wear ANY jewelry on the day of surgery. If there are rings that are too tight to                   remove easily, please address this prior to the surgery day. Piercings need to be removed.                                                                     NO METAL ON YOUR BODY.                  Do NOT bring any valuables.  If you came to Pre-Admit testing then you will not need license,                   insurance card or credit card.  If you will be staying overnight, please either leave your things in                   the car or have your family be responsible for these items.                   [facility] IS NOT RESPONSIBLE FOR BELONGINGS OR VALUABLES.  ___X__ 8. DO NOT wear contact lenses on surgery day.  You may not have dentures,                     Hearing aides, contacts or glasses in the operating room. These items can be                    Placed in the Recovery Room to receive immediately after surgery.   ___x__ 10. Take the following medications on the morning of surgery with a sip of water:                              1. AMLODIPINE  2. CARVEDILOL                     3. xiidra eye drops                     4. PLAQUENIL                     5.  VESICARE                     6. PRILOSEC (take extra dose night before surgery also)                     7. ZOLOFT                      __X___ 11.  Follow any instructions provided to you by your surgeon.                        Such as enema, clear liquid bowel prep                     PLEASE DRINK ALL OF THE PRESURGICAL CARBOHYDRATE DRINK                      BY 2 HOURS PRIOR TO SURGERY.  __X__  12. STOP ALL ASPIRIN PRODUCTS AS OF June 7TH (1 week prior to surgery).                       THIS INCLUDES BC POWDERS / GOODIES POWDER / GAVISCON  __x___ 13. STOP Anti-inflammatories as of June 7th (1 week prior to surgery).                      This includes IBUPROFEN / MOTRIN / ADVIL / ALEVE/ NAPROXYN /                         CLINORIL / VOLTAREN GEL                    YOU MAY TAKE TYLENOL ANY TIME PRIOR TO SURGERY.  __X___ 14.  Stop supplements until after surgery.                     This includes: MULTIVITAMINS              __X____17.  Continue to take the following medications but do not take on the morning of surgery:                        // ZESTORETIC //   ___x___18. If staying overnight, please have appropriate shoes to wear to be able to walk around the unit.                   Wear clean and comfortable clothing to the hospital.   REMEMBER TO [facility] YOUR PHONE AND CHARGER. BRING PHONE NUMBERS FOR YOUR CONTACTS.  IF YOU COMPLETE THE POWER OF ATTORNEY/LIVING WILL PAPERWORK, PLEASE BRING WITH   YOU SO WE CAN MAKE A COPY FOR YOUR CHART.

## 2020-06-27 ENCOUNTER — Other Ambulatory Visit
Admission: RE | Admit: 2020-06-27 | Discharge: 2020-06-27 | Disposition: A | Discharge status: Home / Self Care | Payer: Medicare Other | Source: Ambulatory Visit | Attending: Surgery | Admitting: Surgery

## 2020-06-27 ENCOUNTER — Other Ambulatory Visit: Payer: Self-pay

## 2020-06-27 DIAGNOSIS — Z01812 Encounter for preprocedural laboratory examination: Secondary | ICD-10-CM | POA: Insufficient documentation

## 2020-06-27 DIAGNOSIS — Z20822 Contact with and (suspected) exposure to covid-19: Secondary | ICD-10-CM | POA: Insufficient documentation

## 2020-06-27 LAB — SARS CORONAVIRUS 2 (TAT 6-24 HRS): SARS Coronavirus 2: NEGATIVE

## 2020-07-01 ENCOUNTER — Inpatient Hospital Stay: Payer: Medicare Other | Admitting: Anesthesiology

## 2020-07-01 ENCOUNTER — Encounter
Admission: RE | Disposition: A | Discharge status: Skilled Nursing Facility | Payer: Self-pay | Source: Home / Self Care | Attending: Surgery

## 2020-07-01 ENCOUNTER — Inpatient Hospital Stay
Admission: RE | Admit: 2020-07-01 | Discharge: 2020-07-07 | DRG: 470 | Disposition: A | Discharge status: Skilled Nursing Facility | Payer: Medicare Other | Attending: Surgery | Admitting: Surgery

## 2020-07-01 ENCOUNTER — Encounter: Payer: Self-pay | Admitting: Surgery

## 2020-07-01 ENCOUNTER — Other Ambulatory Visit: Payer: Self-pay

## 2020-07-01 ENCOUNTER — Inpatient Hospital Stay: Payer: Medicare Other

## 2020-07-01 DIAGNOSIS — G4730 Sleep apnea, unspecified: Secondary | ICD-10-CM | POA: Diagnosis present

## 2020-07-01 DIAGNOSIS — Z7982 Long term (current) use of aspirin: Secondary | ICD-10-CM | POA: Diagnosis not present

## 2020-07-01 DIAGNOSIS — Z96651 Presence of right artificial knee joint: Secondary | ICD-10-CM

## 2020-07-01 DIAGNOSIS — J45909 Unspecified asthma, uncomplicated: Secondary | ICD-10-CM | POA: Diagnosis present

## 2020-07-01 DIAGNOSIS — R42 Dizziness and giddiness: Secondary | ICD-10-CM | POA: Diagnosis present

## 2020-07-01 DIAGNOSIS — G629 Polyneuropathy, unspecified: Secondary | ICD-10-CM | POA: Diagnosis present

## 2020-07-01 DIAGNOSIS — Z880 Allergy status to penicillin: Secondary | ICD-10-CM | POA: Diagnosis not present

## 2020-07-01 DIAGNOSIS — Z79890 Hormone replacement therapy (postmenopausal): Secondary | ICD-10-CM

## 2020-07-01 DIAGNOSIS — F319 Bipolar disorder, unspecified: Secondary | ICD-10-CM | POA: Diagnosis present

## 2020-07-01 DIAGNOSIS — Z90710 Acquired absence of both cervix and uterus: Secondary | ICD-10-CM | POA: Diagnosis not present

## 2020-07-01 DIAGNOSIS — I10 Essential (primary) hypertension: Secondary | ICD-10-CM | POA: Diagnosis present

## 2020-07-01 DIAGNOSIS — Z6831 Body mass index (BMI) 31.0-31.9, adult: Secondary | ICD-10-CM | POA: Diagnosis not present

## 2020-07-01 DIAGNOSIS — G479 Sleep disorder, unspecified: Secondary | ICD-10-CM | POA: Diagnosis present

## 2020-07-01 DIAGNOSIS — Z79899 Other long term (current) drug therapy: Secondary | ICD-10-CM

## 2020-07-01 DIAGNOSIS — E669 Obesity, unspecified: Secondary | ICD-10-CM | POA: Diagnosis present

## 2020-07-01 DIAGNOSIS — Z831 Family history of other infectious and parasitic diseases: Secondary | ICD-10-CM | POA: Diagnosis not present

## 2020-07-01 DIAGNOSIS — Z96652 Presence of left artificial knee joint: Secondary | ICD-10-CM | POA: Diagnosis present

## 2020-07-01 DIAGNOSIS — Z87440 Personal history of urinary (tract) infections: Secondary | ICD-10-CM

## 2020-07-01 DIAGNOSIS — Z87891 Personal history of nicotine dependence: Secondary | ICD-10-CM

## 2020-07-01 DIAGNOSIS — E039 Hypothyroidism, unspecified: Secondary | ICD-10-CM | POA: Diagnosis present

## 2020-07-01 DIAGNOSIS — Z888 Allergy status to other drugs, medicaments and biological substances status: Secondary | ICD-10-CM | POA: Diagnosis not present

## 2020-07-01 DIAGNOSIS — Z8249 Family history of ischemic heart disease and other diseases of the circulatory system: Secondary | ICD-10-CM

## 2020-07-01 DIAGNOSIS — Z20822 Contact with and (suspected) exposure to covid-19: Secondary | ICD-10-CM | POA: Diagnosis present

## 2020-07-01 DIAGNOSIS — E785 Hyperlipidemia, unspecified: Secondary | ICD-10-CM | POA: Diagnosis present

## 2020-07-01 DIAGNOSIS — M1711 Unilateral primary osteoarthritis, right knee: Secondary | ICD-10-CM | POA: Diagnosis present

## 2020-07-01 DIAGNOSIS — Z823 Family history of stroke: Secondary | ICD-10-CM

## 2020-07-01 DIAGNOSIS — Z882 Allergy status to sulfonamides status: Secondary | ICD-10-CM | POA: Diagnosis not present

## 2020-07-01 DIAGNOSIS — H9190 Unspecified hearing loss, unspecified ear: Secondary | ICD-10-CM | POA: Diagnosis present

## 2020-07-01 HISTORY — PX: TOTAL KNEE ARTHROPLASTY: SHX125

## 2020-07-01 LAB — POCT I-STAT, CHEM 8
BUN: 25 mg/dL — ABNORMAL HIGH (ref 8–23)
Calcium, Ion: 1.27 mmol/L (ref 1.15–1.40)
Chloride: 102 mmol/L (ref 98–111)
Creatinine, Ser: 0.9 mg/dL (ref 0.44–1.00)
Glucose, Bld: 75 mg/dL (ref 70–99)
HCT: 37 % (ref 36.0–46.0)
Hemoglobin: 12.6 g/dL (ref 12.0–15.0)
Potassium: 4 mmol/L (ref 3.5–5.1)
Sodium: 142 mmol/L (ref 135–145)
TCO2: 27 mmol/L (ref 22–32)

## 2020-07-01 SURGERY — ARTHROPLASTY, KNEE, TOTAL
Anesthesia: Spinal | Site: Knee | Laterality: Right

## 2020-07-01 MED ORDER — PROPOFOL 1000 MG/100ML IV EMUL
INTRAVENOUS | Status: AC
  Filled 2020-07-01: qty 100

## 2020-07-01 MED ORDER — ONDANSETRON HCL 4 MG PO TABS: 4.0000 mg | ORAL_TABLET | Freq: Four times a day (QID) | ORAL | Status: DC | PRN

## 2020-07-01 MED ORDER — KETOROLAC TROMETHAMINE 15 MG/ML IJ SOLN
7.5000 mg | Freq: Four times a day (QID) | INTRAMUSCULAR | Status: AC
  Administered 2020-07-01 – 2020-07-02 (×4): 7.5 mg via INTRAVENOUS
  Filled 2020-07-01 (×4): qty 1

## 2020-07-01 MED ORDER — LIDOCAINE HCL (PF) 2 % IJ SOLN
INTRAMUSCULAR | Status: AC
  Filled 2020-07-01: qty 5

## 2020-07-01 MED ORDER — CYCLOSPORINE 0.05 % OP EMUL
1.0000 [drp] | Freq: Two times a day (BID) | OPHTHALMIC | Status: DC
  Administered 2020-07-01 – 2020-07-07 (×12): 1 [drp] via OPHTHALMIC
  Filled 2020-07-01 (×13): qty 1

## 2020-07-01 MED ORDER — SODIUM CHLORIDE 0.9 % IV SOLN
INTRAVENOUS | Status: DC | PRN
  Administered 2020-07-01: 50 ug/min via INTRAVENOUS

## 2020-07-01 MED ORDER — SERTRALINE HCL 50 MG PO TABS
100.0000 mg | ORAL_TABLET | Freq: Every day | ORAL | Status: DC
  Administered 2020-07-02 – 2020-07-07 (×6): 100 mg via ORAL
  Filled 2020-07-01 (×6): qty 2

## 2020-07-01 MED ORDER — PROPOFOL 10 MG/ML IV BOLUS
INTRAVENOUS | Status: DC | PRN
  Administered 2020-07-01 (×2): 30 mg via INTRAVENOUS
  Administered 2020-07-01: 50 ug/kg/min via INTRAVENOUS

## 2020-07-01 MED ORDER — DIVALPROEX SODIUM ER 500 MG PO TB24
1000.0000 mg | ORAL_TABLET | Freq: Every evening | ORAL | Status: DC
  Administered 2020-07-01 – 2020-07-06 (×6): 1000 mg via ORAL
  Filled 2020-07-01 (×7): qty 2

## 2020-07-01 MED ORDER — ACETAMINOPHEN 500 MG PO TABS
1000.0000 mg | ORAL_TABLET | Freq: Four times a day (QID) | ORAL | Status: AC
  Administered 2020-07-01 – 2020-07-02 (×4): 1000 mg via ORAL
  Filled 2020-07-01 (×4): qty 2

## 2020-07-01 MED ORDER — METOCLOPRAMIDE HCL 5 MG/ML IJ SOLN: 5.0000 mg | Freq: Three times a day (TID) | INTRAMUSCULAR | Status: DC | PRN

## 2020-07-01 MED ORDER — TRANEXAMIC ACID 1000 MG/10ML IV SOLN
INTRAVENOUS | Status: AC
  Filled 2020-07-01: qty 10

## 2020-07-01 MED ORDER — KETOROLAC TROMETHAMINE 15 MG/ML IJ SOLN: 15.0000 mg | Freq: Once | INTRAMUSCULAR | Status: AC

## 2020-07-01 MED ORDER — LIFITEGRAST 5 % OP SOLN: 1.0000 [drp] | Freq: Two times a day (BID) | OPHTHALMIC | Status: DC

## 2020-07-01 MED ORDER — PANTOPRAZOLE SODIUM 40 MG PO TBEC: 40.0000 mg | DELAYED_RELEASE_TABLET | Freq: Every day | ORAL | Status: DC

## 2020-07-01 MED ORDER — MIDAZOLAM HCL 2 MG/2ML IJ SOLN
INTRAMUSCULAR | Status: AC
  Filled 2020-07-01: qty 2

## 2020-07-01 MED ORDER — ACETAMINOPHEN 325 MG PO TABS
325.0000 mg | ORAL_TABLET | Freq: Four times a day (QID) | ORAL | Status: DC | PRN
  Administered 2020-07-03 – 2020-07-06 (×2): 650 mg via ORAL
  Filled 2020-07-01 (×3): qty 2

## 2020-07-01 MED ORDER — FLEET ENEMA 7-19 GM/118ML RE ENEM: 1.0000 | ENEMA | Freq: Once | RECTAL | Status: DC | PRN

## 2020-07-01 MED ORDER — MIDAZOLAM HCL 5 MG/5ML IJ SOLN
INTRAMUSCULAR | Status: DC | PRN
  Administered 2020-07-01 (×2): 1 mg via INTRAVENOUS

## 2020-07-01 MED ORDER — SODIUM CHLORIDE 0.9 % IV SOLN
INTRAVENOUS | Status: DC | PRN
  Administered 2020-07-01: 60 mL

## 2020-07-01 MED ORDER — SODIUM CHLORIDE 0.9 % IR SOLN
Status: DC | PRN
  Administered 2020-07-01: 3000 mL

## 2020-07-01 MED ORDER — TRAMADOL HCL 50 MG PO TABS
50.0000 mg | ORAL_TABLET | Freq: Four times a day (QID) | ORAL | Status: DC | PRN
Start: 2020-07-01 — End: 2020-07-07
  Administered 2020-07-01 – 2020-07-07 (×4): 50 mg via ORAL
  Filled 2020-07-01 (×5): qty 1

## 2020-07-01 MED ORDER — HYDROXYCHLOROQUINE SULFATE 200 MG PO TABS
200.0000 mg | ORAL_TABLET | Freq: Two times a day (BID) | ORAL | Status: DC
  Administered 2020-07-01 – 2020-07-07 (×12): 200 mg via ORAL
  Filled 2020-07-01 (×12): qty 1

## 2020-07-01 MED ORDER — ONDANSETRON HCL 4 MG/2ML IJ SOLN
4.0000 mg | Freq: Four times a day (QID) | INTRAMUSCULAR | Status: DC | PRN
  Administered 2020-07-03: 4 mg via INTRAVENOUS
  Filled 2020-07-01: qty 2

## 2020-07-01 MED ORDER — CHLORHEXIDINE GLUCONATE 0.12 % MT SOLN: 15.0000 mL | Freq: Once | OROMUCOSAL | Status: AC

## 2020-07-01 MED ORDER — LISINOPRIL-HYDROCHLOROTHIAZIDE 20-25 MG PO TABS: 1.0000 | ORAL_TABLET | Freq: Every day | ORAL | Status: DC

## 2020-07-01 MED ORDER — CEFAZOLIN SODIUM-DEXTROSE 2-4 GM/100ML-% IV SOLN
2.0000 g | INTRAVENOUS | Status: AC
  Administered 2020-07-01: 2 g via INTRAVENOUS

## 2020-07-01 MED ORDER — BUPIVACAINE-EPINEPHRINE (PF) 0.5% -1:200000 IJ SOLN
INTRAMUSCULAR | Status: AC
  Filled 2020-07-01: qty 30

## 2020-07-01 MED ORDER — ORAL CARE MOUTH RINSE: 15.0000 mL | Freq: Once | OROMUCOSAL | Status: AC

## 2020-07-01 MED ORDER — DIPHENHYDRAMINE HCL 12.5 MG/5ML PO ELIX: 12.5000 mg | ORAL_SOLUTION | ORAL | Status: DC | PRN

## 2020-07-01 MED ORDER — HYDROMORPHONE HCL 1 MG/ML IJ SOLN
0.2500 mg | INTRAMUSCULAR | Status: DC | PRN
  Administered 2020-07-01: 0.5 mg via INTRAVENOUS
  Filled 2020-07-01: qty 1

## 2020-07-01 MED ORDER — FENTANYL CITRATE (PF) 100 MCG/2ML IJ SOLN
INTRAMUSCULAR | Status: AC
  Filled 2020-07-01: qty 2

## 2020-07-01 MED ORDER — LIDOCAINE HCL (CARDIAC) PF 100 MG/5ML IV SOSY
PREFILLED_SYRINGE | INTRAVENOUS | Status: DC | PRN
  Administered 2020-07-01: 20 mg via INTRAVENOUS

## 2020-07-01 MED ORDER — LORATADINE 10 MG PO TABS
10.0000 mg | ORAL_TABLET | Freq: Every day | ORAL | Status: DC
  Administered 2020-07-01 – 2020-07-07 (×7): 10 mg via ORAL
  Filled 2020-07-01 (×7): qty 1

## 2020-07-01 MED ORDER — CHLORHEXIDINE GLUCONATE 0.12 % MT SOLN
OROMUCOSAL | Status: AC
  Administered 2020-07-01: 15 mL via OROMUCOSAL
  Filled 2020-07-01: qty 15

## 2020-07-01 MED ORDER — POLYVINYL ALCOHOL 1.4 % OP SOLN
1.0000 [drp] | OPHTHALMIC | Status: DC | PRN
  Filled 2020-07-01: qty 15

## 2020-07-01 MED ORDER — SODIUM CHLORIDE 0.9 % IV SOLN: INTRAVENOUS | Status: DC

## 2020-07-01 MED ORDER — FENTANYL CITRATE (PF) 100 MCG/2ML IJ SOLN: 25.0000 ug | INTRAMUSCULAR | Status: DC | PRN

## 2020-07-01 MED ORDER — SODIUM CHLORIDE FLUSH 0.9 % IV SOLN
INTRAVENOUS | Status: AC
  Filled 2020-07-01: qty 40

## 2020-07-01 MED ORDER — KETOROLAC TROMETHAMINE 15 MG/ML IJ SOLN
INTRAMUSCULAR | Status: AC
  Administered 2020-07-01: 15 mg via INTRAVENOUS
  Filled 2020-07-01: qty 1

## 2020-07-01 MED ORDER — METOCLOPRAMIDE HCL 10 MG PO TABS
5.0000 mg | ORAL_TABLET | Freq: Three times a day (TID) | ORAL | Status: DC | PRN
  Administered 2020-07-01: 10 mg via ORAL
  Filled 2020-07-01: qty 1

## 2020-07-01 MED ORDER — MAGNESIUM HYDROXIDE 400 MG/5ML PO SUSP
30.0000 mL | Freq: Every day | ORAL | Status: DC | PRN
  Administered 2020-07-01 – 2020-07-05 (×2): 30 mL via ORAL
  Filled 2020-07-01 (×2): qty 30

## 2020-07-01 MED ORDER — PRAVASTATIN SODIUM 20 MG PO TABS
10.0000 mg | ORAL_TABLET | Freq: Every day | ORAL | Status: DC
  Administered 2020-07-01 – 2020-07-06 (×6): 10 mg via ORAL
  Filled 2020-07-01 (×6): qty 1

## 2020-07-01 MED ORDER — ADULT MULTIVITAMIN W/MINERALS CH
1.0000 | ORAL_TABLET | Freq: Every day | ORAL | Status: DC
  Administered 2020-07-01 – 2020-07-07 (×7): 1 via ORAL
  Filled 2020-07-01 (×7): qty 1

## 2020-07-01 MED ORDER — BUPIVACAINE HCL (PF) 0.5 % IJ SOLN
INTRAMUSCULAR | Status: DC | PRN
  Administered 2020-07-01: 3 mL

## 2020-07-01 MED ORDER — EPHEDRINE SULFATE 50 MG/ML IJ SOLN
INTRAMUSCULAR | Status: DC | PRN
  Administered 2020-07-01: 10 mg via INTRAVENOUS
  Administered 2020-07-01 (×2): 5 mg via INTRAVENOUS

## 2020-07-01 MED ORDER — LISINOPRIL 20 MG PO TABS
20.0000 mg | ORAL_TABLET | Freq: Every day | ORAL | Status: DC
  Administered 2020-07-01 – 2020-07-07 (×7): 20 mg via ORAL
  Filled 2020-07-01 (×8): qty 1

## 2020-07-01 MED ORDER — ONDANSETRON HCL 4 MG/2ML IJ SOLN: 4.0000 mg | Freq: Once | INTRAMUSCULAR | Status: DC | PRN

## 2020-07-01 MED ORDER — CEFAZOLIN SODIUM-DEXTROSE 2-4 GM/100ML-% IV SOLN
2.0000 g | Freq: Four times a day (QID) | INTRAVENOUS | Status: AC
  Administered 2020-07-01 – 2020-07-02 (×3): 2 g via INTRAVENOUS
  Filled 2020-07-01 (×5): qty 100

## 2020-07-01 MED ORDER — EPHEDRINE 5 MG/ML INJ
INTRAVENOUS | Status: AC
  Filled 2020-07-01: qty 10

## 2020-07-01 MED ORDER — LACTATED RINGERS IV SOLN: INTRAVENOUS | Status: DC

## 2020-07-01 MED ORDER — BUPIVACAINE-EPINEPHRINE (PF) 0.5% -1:200000 IJ SOLN
INTRAMUSCULAR | Status: DC | PRN
  Administered 2020-07-01: 30 mL via PERINEURAL

## 2020-07-01 MED ORDER — OXYCODONE HCL 5 MG PO TABS
5.0000 mg | ORAL_TABLET | ORAL | Status: DC | PRN
  Administered 2020-07-01 – 2020-07-04 (×9): 10 mg via ORAL
  Filled 2020-07-01 (×9): qty 2

## 2020-07-01 MED ORDER — AMLODIPINE BESYLATE 10 MG PO TABS
10.0000 mg | ORAL_TABLET | Freq: Every day | ORAL | Status: DC
  Administered 2020-07-02 – 2020-07-07 (×6): 10 mg via ORAL
  Filled 2020-07-01 (×6): qty 1

## 2020-07-01 MED ORDER — LEVOTHYROXINE SODIUM 50 MCG PO TABS
50.0000 ug | ORAL_TABLET | Freq: Every day | ORAL | Status: DC
  Administered 2020-07-02 – 2020-07-07 (×6): 50 ug via ORAL
  Filled 2020-07-01 (×6): qty 1

## 2020-07-01 MED ORDER — BUPIVACAINE LIPOSOME 1.3 % IJ SUSP
INTRAMUSCULAR | Status: AC
  Filled 2020-07-01: qty 20

## 2020-07-01 MED ORDER — ALUM & MAG HYDROXIDE-SIMETH 200-200-20 MG/5ML PO SUSP: 15.0000 mL | ORAL | Status: DC | PRN

## 2020-07-01 MED ORDER — TRANEXAMIC ACID 1000 MG/10ML IV SOLN
INTRAVENOUS | Status: DC | PRN
  Administered 2020-07-01: 1000 mg via TOPICAL

## 2020-07-01 MED ORDER — OMEPRAZOLE 20 MG PO CPDR
40.0000 mg | DELAYED_RELEASE_CAPSULE | Freq: Every day | ORAL | Status: DC
  Administered 2020-07-02 – 2020-07-07 (×7): 40 mg via ORAL
  Filled 2020-07-01 (×3): qty 2

## 2020-07-01 MED ORDER — DARIFENACIN HYDROBROMIDE ER 7.5 MG PO TB24
7.5000 mg | ORAL_TABLET | Freq: Every day | ORAL | Status: DC
  Administered 2020-07-02 – 2020-07-07 (×6): 7.5 mg via ORAL
  Filled 2020-07-01 (×6): qty 1

## 2020-07-01 MED ORDER — DOCUSATE SODIUM 100 MG PO CAPS
100.0000 mg | ORAL_CAPSULE | Freq: Two times a day (BID) | ORAL | Status: DC
  Administered 2020-07-01 – 2020-07-07 (×13): 100 mg via ORAL
  Filled 2020-07-01 (×13): qty 1

## 2020-07-01 MED ORDER — PROPOFOL 10 MG/ML IV BOLUS
INTRAVENOUS | Status: AC
  Filled 2020-07-01: qty 20

## 2020-07-01 MED ORDER — HYDROCHLOROTHIAZIDE 25 MG PO TABS
25.0000 mg | ORAL_TABLET | Freq: Every day | ORAL | Status: DC
  Administered 2020-07-01 – 2020-07-07 (×7): 25 mg via ORAL
  Filled 2020-07-01 (×7): qty 1

## 2020-07-01 MED ORDER — BISACODYL 10 MG RE SUPP
10.0000 mg | Freq: Every day | RECTAL | Status: DC | PRN
  Filled 2020-07-01: qty 1

## 2020-07-01 MED ORDER — APIXABAN 2.5 MG PO TABS
2.5000 mg | ORAL_TABLET | Freq: Two times a day (BID) | ORAL | Status: DC
  Administered 2020-07-02 – 2020-07-07 (×11): 2.5 mg via ORAL
  Filled 2020-07-01 (×11): qty 1

## 2020-07-01 MED ORDER — CARVEDILOL 3.125 MG PO TABS
6.2500 mg | ORAL_TABLET | Freq: Two times a day (BID) | ORAL | Status: DC
  Administered 2020-07-01 – 2020-07-07 (×11): 6.25 mg via ORAL
  Filled 2020-07-01 (×11): qty 2

## 2020-07-01 SURGICAL SUPPLY — 59 items
BIT DRILL QUICK REL 1/8 2PK SL (DRILL) ×1 IMPLANT
BLADE SAW SAG 25X90X1.19 (BLADE) ×3 IMPLANT
BLADE SURG SZ20 CARB STEEL (BLADE) ×3 IMPLANT
BNDG ELASTIC 6X5.8 VLCR NS LF (GAUZE/BANDAGES/DRESSINGS) ×3 IMPLANT
CEMENT BONE R 1X40 (Cement) ×6 IMPLANT
CEMENT VACUUM MIXING SYSTEM (MISCELLANEOUS) ×3 IMPLANT
CHLORAPREP W/TINT 26 (MISCELLANEOUS) ×3 IMPLANT
COMP FEMORAL CRUC RIGHT 72.5 (Joint) ×3 IMPLANT
COMPONENT FEMRL CRUC RT 72.5 (Joint) ×1 IMPLANT
COOLER POLAR GLACIER W/PUMP (MISCELLANEOUS) ×3 IMPLANT
COVER MAYO STAND REUSABLE (DRAPES) ×3 IMPLANT
COVER WAND RF STERILE (DRAPES) ×3 IMPLANT
CUFF TOURN SGL QUICK 24 (TOURNIQUET CUFF)
CUFF TOURN SGL QUICK 34 (TOURNIQUET CUFF) ×3
CUFF TRNQT CYL 24X4X16.5-23 (TOURNIQUET CUFF) IMPLANT
CUFF TRNQT CYL 34X4.125X (TOURNIQUET CUFF) ×1 IMPLANT
DRAPE 3/4 80X56 (DRAPES) ×3 IMPLANT
DRAPE IMP U-DRAPE 54X76 (DRAPES) ×3 IMPLANT
DRILL QUICK RELEASE 1/8 INCH (DRILL) ×3
DRSG MEPILEX SACRM 8.7X9.8 (GAUZE/BANDAGES/DRESSINGS) ×3 IMPLANT
DRSG OPSITE POSTOP 4X10 (GAUZE/BANDAGES/DRESSINGS) ×3 IMPLANT
DRSG OPSITE POSTOP 4X8 (GAUZE/BANDAGES/DRESSINGS) IMPLANT
ELECT REM PT RETURN 9FT ADLT (ELECTROSURGICAL) ×3
ELECTRODE REM PT RTRN 9FT ADLT (ELECTROSURGICAL) ×1 IMPLANT
GAUZE XEROFORM 1X8 LF (GAUZE/BANDAGES/DRESSINGS) ×3 IMPLANT
GLOVE SRG 8 PF TXTR STRL LF DI (GLOVE) ×1 IMPLANT
GLOVE SURG ENC MOIS LTX SZ7.5 (GLOVE) ×12 IMPLANT
GLOVE SURG ENC MOIS LTX SZ8 (GLOVE) ×12 IMPLANT
GLOVE SURG UNDER LTX SZ8 (GLOVE) ×3 IMPLANT
GLOVE SURG UNDER POLY LF SZ8 (GLOVE) ×3
GOWN STRL REUS W/ TWL LRG LVL3 (GOWN DISPOSABLE) ×1 IMPLANT
GOWN STRL REUS W/ TWL XL LVL3 (GOWN DISPOSABLE) ×1 IMPLANT
GOWN STRL REUS W/TWL LRG LVL3 (GOWN DISPOSABLE) ×3
GOWN STRL REUS W/TWL XL LVL3 (GOWN DISPOSABLE) ×3
HOOD PEEL AWAY FLYTE STAYCOOL (MISCELLANEOUS) ×9 IMPLANT
INSERT TIB BEARING 75X12 (Insert) ×3 IMPLANT
IV NS IRRIG 3000ML ARTHROMATIC (IV SOLUTION) ×3 IMPLANT
KIT TURNOVER KIT A (KITS) ×3 IMPLANT
MANIFOLD NEPTUNE II (INSTRUMENTS) ×3 IMPLANT
NEEDLE SPNL 20GX3.5 QUINCKE YW (NEEDLE) ×3 IMPLANT
NS IRRIG 1000ML POUR BTL (IV SOLUTION) ×3 IMPLANT
PACK TOTAL KNEE (MISCELLANEOUS) ×3 IMPLANT
PAD WRAPON POLAR KNEE (MISCELLANEOUS) ×1 IMPLANT
PATELLA STD 34X8.5 (Orthopedic Implant) ×3 IMPLANT
PENCIL SMOKE EVACUATOR (MISCELLANEOUS) ×3 IMPLANT
PLATE KNEE TIBIAL 75MM FIXED (Plate) ×3 IMPLANT
PULSAVAC PLUS IRRIG FAN TIP (DISPOSABLE) ×3
STAPLER SKIN PROX 35W (STAPLE) ×3 IMPLANT
SUCTION FRAZIER HANDLE 10FR (MISCELLANEOUS) ×3
SUCTION TUBE FRAZIER 10FR DISP (MISCELLANEOUS) ×1 IMPLANT
SUT VIC AB 0 CT1 36 (SUTURE) ×12 IMPLANT
SUT VIC AB 2-0 CT1 27 (SUTURE) ×9
SUT VIC AB 2-0 CT1 TAPERPNT 27 (SUTURE) ×3 IMPLANT
SYR 10ML LL (SYRINGE) ×3 IMPLANT
SYR 20ML LL LF (SYRINGE) ×3 IMPLANT
SYR 30ML LL (SYRINGE) IMPLANT
TIP FAN IRRIG PULSAVAC PLUS (DISPOSABLE) ×1 IMPLANT
TRAP FLUID SMOKE EVACUATOR (MISCELLANEOUS) ×3 IMPLANT
WRAPON POLAR PAD KNEE (MISCELLANEOUS) ×3

## 2020-07-01 NOTE — Transfer of Care (Signed)
Immediate Anesthesia Transfer of Care Note  Patient: Jessica Norman  Procedure(s) Performed: TOTAL KNEE ARTHROPLASTY (Right: Knee)  Patient Location: PACU  Anesthesia Type:Spinal  Level of Consciousness: awake, alert  and oriented  Airway & Oxygen Therapy: Patient Spontanous Breathing and Patient connected to face mask oxygen  Post-op Assessment: Report given to RN and Post -op Vital signs reviewed and stable  Post vital signs: Reviewed and stable  Last Vitals:  Vitals Value Taken Time  BP 115/70 07/01/20 1000  Temp 36.6 C 07/01/20 1000  Pulse 50 07/01/20 1008  Resp 20 07/01/20 1008  SpO2 100 % 07/01/20 1008  Vitals shown include unvalidated device data.  Last Pain:  Vitals:   07/01/20 0700  TempSrc: Oral  PainSc: 0-No pain         Complications: No notable events documented.

## 2020-07-01 NOTE — H&P (Signed)
History of Present Illness:  Jessica Norman is a 70 y.o. female who presents for follow-up of her right knee pain secondary to degenerative joint disease. The patient was last seen for the symptoms 5.5 months ago. At the time, she did not wish to pursue further intervention on her knee. She has been wearing a knee brace and taking over-the-counter medications as needed for discomfort. She finds that her symptoms are unchanged if not worse since this last visit. She complains of increased pain, primarily on the lateral aspect of her knee with radiation down the lateral aspect of her lower leg. Her symptoms are aggravated by any prolonged standing or ambulation, as well as when attempting to ascend/descend stairs. She also has pain at night. She has been wearing a knee brace, taking Tylenol, and applying Voltaren gel or heat as necessary with limited benefit. She rates her pain as high as 7/10. She is having difficulty sleeping at night. She denies any reinjury to the knee, and denies any numbness or paresthesias down her leg to her foot. She is quite frustrated by these symptoms and functional limitations, and is now ready to consider more aggressive treatment options.  Current Outpatient Medications:  albuterol 90 mcg/actuation inhaler Inhale 2 inhalations into the lungs every 6 (six) hours as needed for Wheezing or Shortness of Breath 1 Inhaler 0   aluminum hydroxide-magnesium carbonate (GAVISCON) 95-358 mg/15 mL oral suspension Take 15 mLs by mouth every 4 (four) hours as needed   amLODIPine (NORVASC) 10 MG tablet Take 10 mg by mouth once daily.   aspirin 81 MG EC tablet Take 81 mg by mouth once daily.   carvedilol (COREG) 6.25 MG tablet TK 1 T PO BID 0   cycloSPORINE (RESTASIS) 0.05 % ophthalmic emulsion Place 1 drop into both eyes 2 (two) times daily   diclofenac (VOLTAREN) 0.1 % ophthalmic solution 2 drops 4 (four) times daily.   divalproex (DEPAKOTE ER) 500 MG ER tablet Take by mouth once daily.    fexofenadine (ALLEGRA) 180 MG tablet Take 180 mg by mouth once daily as needed   FUROsemide (LASIX) 20 MG tablet Take 1 tablet (20 mg total) by mouth once daily as needed for Edema (leg swelling) 30 tablet 11   HYDROcodone-acetaminophen (NORCO) 5-325 mg tablet Take 1 tablet by mouth 2 (two) times daily as needed for Pain 20 tablet 0   hydrOXYchloroQUINE (PLAQUENIL) 200 mg tablet Take 2 tablets (400 mg total) by mouth once daily 180 tablet 1   levothyroxine (SYNTHROID, LEVOTHROID) 50 MCG tablet TK 1 T PO D FOR HYPOTHYROIDISM 2   lisinopril-hydrochlorothiazide (PRINZIDE,ZESTORETIC) 20-25 mg tablet Take 1 tablet by mouth once daily.   lovastatin (MEVACOR) 10 MG tablet Take 10 mg by mouth daily with dinner.   meclizine (ANTIVERT) 25 mg tablet TK 1 T PO Q 6 H PRN FOR DIZZINESS. WATCH FOR SEDATION   methocarbamoL (ROBAXIN) 500 MG tablet Take 500 mg by mouth 4 (four) times daily as needed   metroNIDAZOLE (FLAGYL) 500 MG tablet TAKE 1 TABLET BY MOUTH TWICE DAILY FOR INFECTION   multivitamin with iron-minerals (SUPER THERA VITE M) tablet Take 1 tablet by mouth once daily   omeprazole (PRILOSEC) 40 MG DR capsule Take 1 capsule (40 mg total) by mouth 2 (two) times daily Take 1 tablet 30 mins before breakfast and 1 tablet 30 mins before dinner. 60 capsule 0   sertraline (ZOLOFT) 100 MG tablet Take 100 mg by mouth once daily.   solifenacin (VESICARE) 5 MG  tablet TAKE 1 TABLET BY MOUTH EVERY DAY FOR BLADDER SYMPTOMS   sulindac (CLINORIL) 200 MG tablet Take 1 tablet (200 mg total) by mouth 2 (two) times daily 180 tablet 1   diclofenac (VOLTAREN) 1 % topical gel Apply 2 g topically 4 (four) times daily 100 g 11   Allergies:   Codeine Itching   Penicillins Itching   Pantoprazole Itching and Other (Throat and chest tightness)   Penicillin G Itching   Sulfa (Sulfonamide Antibiotics) Itching   Sulfasalazine Itching   Past Medical History:   Anemia   Asthma without status asthmaticus   Bipolar 1 disorder  (CMS-HCC)   Borderline hyperlipidemia   Depression   Eczema   GERD (gastroesophageal reflux disease)   Glaucoma (increased eye pressure)   HA (headache)   Hypertension   Neuropathy   Obesity   Osteoarthritis  a. Chondromalacia. b. Carpal tunnel. c. Knees. d. Shoulders. ([facility])   Seasonal allergies   Sjoegren syndrome (CMS-HCC)   Sleep apnea   Past Surgical History:   CARPAL TUNNEL RELEASE Right   Cervical disc surgery   EGD 01/23/2019 (Reflux esophagitis/No Repeat/TKT)   HYSTERECTOMY VAGINAL   impingement/tendinopathy with partial-thickness rotator cuff tear,biceps tendinopathy, and DJD of both the glenohumeral joint and AC joint, Left shoulder Left 01/16/2015 (Dr. Joice Lofts)   KNEE ARTHROSCOPY   L2-4 OPEN LAMINECTOMY, RIGHT L5/S1 HEMILAMINECTOMY 02/04/2020  Dr. Lucy Chris at [facility]   LAMINECTOMY LUMBAR SPINE x2   LEFT TKA - 07/26/2017 (DR. Dwon Sky)   OOPHORECTOMY   SPINE SURGERY (cervical & lumbar)   Family History:   Stroke Mother   High blood pressure (Hypertension) Mother   Tuberculosis Father   High blood pressure (Hypertension) Father   Stroke Sister   High blood pressure (Hypertension) Sister   Heart disease Brother   Dementia Brother   High blood pressure (Hypertension) Brother   Stroke Brother   High blood pressure (Hypertension) Brother   Stroke Sister   Stroke Brother   Social History:   Socioeconomic History:   Marital status: Married  Tobacco Use   Smoking status: Former Smoker  Types: Cigarettes   Smokeless tobacco: Never Used  Building services engineer Use: Never used  Substance and Sexual Activity   Alcohol use: Not Currently  Alcohol/week: 0.0 standard drinks   Drug use: Never   Sexual activity: Defer  Partners: Male  Birth control/protection: Surgical  Social History Narrative  Education: Automotive engineer  Occupation: Fish farm manager  Hobbies: sewing,crochet  Marital Status: married   Review of Systems:  A comprehensive 14 point ROS was  performed, reviewed, and the pertinent orthopaedic findings are documented in the HPI.  Physical Exam: Vitals:  06/02/20 0927  BP: 124/82  Weight: 96.8 kg (213 lb 6.4 oz)  Height: 175.3 cm (5\' 9" )  PainSc: 7  PainLoc: Knee   General/Constitutional: The patient appears to be well-nourished, well-developed, and in no acute distress. Neuro/Psych: Normal mood and affect, oriented to person, place and time. Eyes: Non-icteric. Pupils are equal, round, and reactive to light, and exhibit synchronous movement. ENT: Unremarkable. Lymphatic: No palpable adenopathy. Respiratory: Lungs clear to auscultation, Normal chest excursion, No wheezes and Non-labored breathing Cardiovascular: Regular rate and rhythm. No murmurs. and No edema, swelling or tenderness, except as noted in detailed exam. Integumentary: No impressive skin lesions present, except as noted in detailed exam. Musculoskeletal: Unremarkable, except as noted in detailed exam.  Right knee exam: GAIT: Mild limp, but is not using any assistive devices ALIGNMENT: Moderate valgus SKIN:  Unremarkable SWELLING: Mild EFFUSION: Small WARMTH: None TENDERNESS: moderate over the lateral joint line, mild along the medial joint line ROM: 0-120 degrees McMURRAY'S: Equivocal PATELLOFEMORAL: Normal tracking with no peri-patellar tenderness and negative apprehension sign CREPITUS: Moderate patellofemoral crepitance LACHMAN'S: Negative PIVOT SHIFT: Negative ANTERIOR DRAWER: Negative POSTERIOR DRAWER: Negative VARUS/VALGUS: Positive pseudolaxity to valgus stressing  She is neurovascularly intact to the right lower extremity and foot.  Assessment: Primary osteoarthritis of right knee.   Plan: The treatment options were discussed with the patient. In addition, patient educational materials were provided regarding the diagnosis and treatment options. The patient is quite frustrated by her symptoms and function limitations, and is now ready to  consider more aggressive treatment options. Therefore, I have recommended a surgical procedure, specifically a right total knee arthroplasty. The procedure was discussed with the patient, as were the potential risks (including bleeding, infection, nerve and/or blood vessel injury, persistent or recurrent pain, loosening and/or failure of the components, dislocation, need for further surgery, blood clots, strokes, heart attacks and/or arhythmias, pneumonia, etc.) and benefits. The patient states his/her understanding and wishes to proceed. All of the patient's questions and concerns were answered. She can call any time with further concerns. She will follow up post-surgery, routine.   H&P reviewed and patient re-examined. No changes.

## 2020-07-01 NOTE — Plan of Care (Signed)
  Problem: Education: Goal: Knowledge of General Education information will improve Description: Including pain rating scale, medication(s)/side effects and non-pharmacologic comfort measures Outcome: Progressing   Problem: Health Behavior/Discharge Planning: Goal: Ability to manage health-related needs will improve Outcome: Progressing   Problem: Clinical Measurements: Goal: Ability to maintain clinical measurements within normal limits will improve Outcome: Progressing Goal: Will remain free from infection Outcome: Progressing Goal: Diagnostic test results will improve Outcome: Progressing Goal: Respiratory complications will improve Outcome: Progressing Goal: Cardiovascular complication will be avoided Outcome: Progressing   Problem: Activity: Goal: Risk for activity intolerance will decrease Outcome: Progressing   Problem: Nutrition: Goal: Adequate nutrition will be maintained Outcome: Progressing   Problem: Coping: Goal: Level of anxiety will decrease Outcome: Progressing   Problem: Elimination: Goal: Will not experience complications related to bowel motility Outcome: Progressing Goal: Will not experience complications related to urinary retention Outcome: Progressing   Problem: Pain Managment: Goal: General experience of comfort will improve Outcome: Progressing   Problem: Skin Integrity: Goal: Risk for impaired skin integrity will decrease Outcome: Progressing   Problem: Education: Goal: Knowledge of the prescribed therapeutic regimen will improve Outcome: Progressing Goal: Individualized Educational Video(s) Outcome: Progressing   Problem: Activity: Goal: Ability to avoid complications of mobility impairment will improve Outcome: Progressing Goal: Range of joint motion will improve Outcome: Progressing   Problem: Clinical Measurements: Goal: Postoperative complications will be avoided or minimized Outcome: Progressing   Problem: Skin  Integrity: Goal: Will show signs of wound healing Outcome: Progressing   Problem: Pain Management: Goal: Pain level will decrease with appropriate interventions Outcome: Progressing

## 2020-07-01 NOTE — Op Note (Signed)
07/01/2020  9:56 AM  Patient:   Jessica Norman  Pre-Op Diagnosis:   Degenerative joint disease, right knee.  Post-Op Diagnosis:   Same  Procedure:   Right TKA using all-cemented Biomet Vanguard system with a 72.5 mm PCR femur, a 75 mm tibial tray with a 12 mm anterior stabilized E-poly insert, and a 34 x 8.5 mm all-poly 3-pegged domed patella.  Surgeon:   Maryagnes Amos, MD  Assistant:   Horris Latino, PA-C   Anesthesia:   Spinal  Findings:   As above  Complications:   None  EBL:   5 cc  Fluids:   1000 cc crystalloid  UOP:   None  TT:   90 minutes at 300 mmHg  Drains:   None  Closure:   Staples  Implants:   As above  Brief Clinical Note:   The patient is a 70 year old female with a long history of progressively worsening right knee pain. The patient's symptoms have progressed despite medications, activity modification, injections, etc. The patient's history and examination were consistent with advanced degenerative joint disease of the right knee confirmed by plain radiographs. The patient presents at this time for a right total knee arthroplasty.  Procedure:   The patient was brought into the operating room. After adequate spinal anesthesia was obtained, the patient was lain in the supine position before the right lower extremity was prepped with ChloraPrep solution and draped sterilely. Preoperative antibiotics were administered. After verifying the proper laterality with a surgical timeout, the limb was exsanguinated with an Esmarch and the tourniquet inflated to 300 mmHg.   A standard anterior approach to the knee was made through an approximately 7 inch incision. The incision was carried down through the subcutaneous tissues to expose superficial retinaculum. This was split the length of the incision and the medial flap elevated sufficiently to expose the medial retinaculum. The medial retinaculum was incised, leaving a 3-4 mm cuff of tissue on the patella. This was  extended distally along the medial border of the patellar tendon and proximally through the medial third of the quadriceps tendon. A subtotal fat pad excision was performed before the soft tissues were elevated off the anteromedial and anterolateral aspects of the proximal tibia to the level of the collateral ligaments. The anterior portions of the medial and lateral menisci were removed, as was the anterior cruciate ligament. With the knee flexed to 90, the external tibial guide was positioned and the appropriate proximal tibial cut made. This piece was taken to the back table where it was measured and found to be optimally replicated by a 75 mm component.  Attention was directed to the distal femur. The intramedullary canal was accessed through a 3/8" drill hole. The intramedullary guide was inserted and positioned in order to obtain a neutral flexion gap. The intercondylar block was positioned with care taken to avoid notching the anterior cortex of the femur. The appropriate cut was made. Next, the distal cutting block was placed at 6 of valgus alignment. Using the 9 mm slot, the distal cut was made. The distal femur was measured and found to be optimally replicated by the 72.5 mm component. The 72.5 mm 4-in-1 cutting block was positioned and first the posterior, then the posterior chamfer, the anterior chamfer, and finally the anterior cuts were made. At this point, the posterior portions medial and lateral menisci were removed. A trial reduction was performed using the appropriate femoral and tibial components with first the 10 mm and then the  12 mm insert. The 12 mm insert demonstrated excellent stability to varus and valgus stressing both in flexion and extension while permitting full extension. Patella tracking was assessed and found to be excellent. Therefore, the tibial guide position was marked on the proximal tibia. The patella thickness was measured and found to be 21 mm. Therefore, the appropriate  cut was made. The patellar surface was measured and found to be optimally replicated by the 34 mm component. The three peg holes were drilled in place before the trial button was inserted. Patella tracking was assessed and found to be excellent, passing the "no thumb test". The lug holes were drilled into the distal femur before the trial component was removed, leaving only the tibial tray. The keel was then created using the appropriate tower, reamer, and punch.  The bony surfaces were prepared for cementing by irrigating them thoroughly with sterile saline solution via the jet lavage system. A bone plug was fashioned from some of the bone that had been removed previously and used to plug the distal femoral canal. In addition, 20 cc of Exparel diluted out to 60 cc with normal saline and 30 cc of 0.5% Sensorcaine were injected into the postero-medial and postero-lateral aspects of the knee, the medial and lateral gutter regions, and the peri-incisional tissues to help with postoperative analgesia. Meanwhile, the cement was being mixed on the back table. When it was ready, the tibial tray was cemented in first. The excess cement was removed using Personal assistant. Next, the femoral component was impacted into place. Again, the excess cement was removed using Personal assistant. The 12 mm trial insert was positioned and the knee brought into extension while the cement hardened. Finally, the patella was cemented into place and secured using the patellar clamp. Again, the excess cement was removed using Personal assistant. Once the cement had hardened, the knee was placed through a range of motion with the findings as described above. Therefore, the trial insert was removed and, after verifying that no cement had been retained posteriorly, the permanent 12 mm anterior stabilized E-polyethylene insert was positioned and secured using the appropriate key locking mechanism. Again the knee was placed through a range of motion  with the findings as described above.  The wound was copiously irrigated with sterile saline solution using the jet lavage system before the quadriceps tendon and retinacular layer were reapproximated using #0 Vicryl interrupted sutures. The superficial retinacular layer also was closed using a running #0 Vicryl suture. A total of 10 cc of transexemic acid (TXA) was injected intra-articularly before the subcutaneous tissues were closed in several layers using 2-0 Vicryl interrupted sutures. The skin was closed using staples. A sterile honeycomb dressing was applied to the skin before the leg was wrapped with an Ace wrap to accommodate the Polar Care device. The patient was then awakened and returned to the recovery room in satisfactory condition after tolerating the procedure well.

## 2020-07-01 NOTE — Anesthesia Preprocedure Evaluation (Signed)
Anesthesia Evaluation  Patient identified by MRN, date of birth, ID band Patient awake    Reviewed: Allergy & Precautions, NPO status , Patient's Chart, lab work & pertinent test results  History of Anesthesia Complications Negative for: history of anesthetic complications  Airway Mallampati: II       Dental   Pulmonary asthma (no inhalers) , neg sleep apnea, Not current smoker, former smoker,           Cardiovascular hypertension, Pt. on medications and Pt. on home beta blockers (-) Past MI and (-) CHF (-) dysrhythmias (-) Valvular Problems/Murmurs     Neuro/Psych neg Seizures Bipolar Disorder    GI/Hepatic Neg liver ROS, GERD  Medicated and Controlled,  Endo/Other  neg diabetesHypothyroidism   Renal/GU negative Renal ROS     Musculoskeletal   Abdominal   Peds  Hematology   Anesthesia Other Findings   Reproductive/Obstetrics                             Anesthesia Physical Anesthesia Plan  ASA: 2  Anesthesia Plan: Spinal   Post-op Pain Management:    Induction:   PONV Risk Score and Plan:   Airway Management Planned:   Additional Equipment:   Intra-op Plan:   Post-operative Plan:   Informed Consent: I have reviewed the patients History and Physical, chart, labs and discussed the procedure including the risks, benefits and alternatives for the proposed anesthesia with the patient or authorized representative who has indicated his/her understanding and acceptance.       Plan Discussed with:   Anesthesia Plan Comments:         Anesthesia Quick Evaluation

## 2020-07-01 NOTE — Anesthesia Procedure Notes (Signed)
Spinal  Patient location during procedure: OR Start time: 07/01/2020 7:30 AM End time: 07/01/2020 7:44 AM Reason for block: surgical anesthesia Staffing Performed: anesthesiologist and resident/CRNA  Anesthesiologist: Gunnar Fusi, MD Resident/CRNA: Rona Ravens, CRNA Preanesthetic Checklist Completed: patient identified, IV checked, site marked, risks and benefits discussed, surgical consent, monitors and equipment checked, pre-op evaluation and timeout performed Spinal Block Patient position: sitting Prep: ChloraPrep Patient monitoring: heart rate, continuous pulse ox, blood pressure and cardiac monitor Approach: midline Location: L3-4 Injection technique: single-shot Needle Needle type: Whitacre and Introducer  Needle gauge: 24 G Needle length: 9 cm Assessment Events: CSF return Additional Notes Negative paresthesia. Negative blood return. Positive free-flowing CSF. Expiration date of kit checked and confirmed. Patient tolerated procedure well, without complications.

## 2020-07-01 NOTE — Progress Notes (Signed)
PT Cancellation Note  Patient Details Name: Jessica Norman MRN: [id number] DOB: [birth year]   Cancelled Treatment:    Reason Eval/Treat Not Completed: Patient not medically ready: Upon entering pt's room pt was found to have little sensation to light touch in the RLE and was unable to perform R ankle DF/PF AROM.  Will attempt to see pt at a future date/time as medically appropriate.     Ovidio Hanger PT, DPT 07/01/20, 4:17 PM

## 2020-07-02 ENCOUNTER — Encounter: Payer: Self-pay | Admitting: Surgery

## 2020-07-02 LAB — CBC
HCT: 33.1 % — ABNORMAL LOW (ref 36.0–46.0)
Hemoglobin: 11 g/dL — ABNORMAL LOW (ref 12.0–15.0)
MCH: 28.9 pg (ref 26.0–34.0)
MCHC: 33.2 g/dL (ref 30.0–36.0)
MCV: 86.9 fL (ref 80.0–100.0)
Platelets: 114 10*3/uL — ABNORMAL LOW (ref 150–400)
RBC: 3.81 MIL/uL — ABNORMAL LOW (ref 3.87–5.11)
RDW: 13.3 % (ref 11.5–15.5)
WBC: 6.2 10*3/uL (ref 4.0–10.5)
nRBC: 0 % (ref 0.0–0.2)

## 2020-07-02 LAB — BASIC METABOLIC PANEL
Anion gap: 4 — ABNORMAL LOW (ref 5–15)
BUN: 19 mg/dL (ref 8–23)
CO2: 30 mmol/L (ref 22–32)
Calcium: 8.4 mg/dL — ABNORMAL LOW (ref 8.9–10.3)
Chloride: 103 mmol/L (ref 98–111)
Creatinine, Ser: 0.77 mg/dL (ref 0.44–1.00)
GFR, Estimated: >60 mL/min (ref >60)
Glucose, Bld: 112 mg/dL — ABNORMAL HIGH (ref 70–99)
Potassium: 4.4 mmol/L (ref 3.5–5.1)
Sodium: 137 mmol/L (ref 135–145)

## 2020-07-02 NOTE — TOC Progression Note (Signed)
Transition of Care [facility]) - Progression Note    Patient Details  Name: Jessica Norman MRN: [id number] Date of Birth: [birth year]  Transition of Care [facility]) CM/SW [place], RN Phone Number: 07/02/2020, 2:12 PM  Clinical Narrative:     Met with the patient to discuss DC plan and needs She lives at home with her husband She is refusing to go to SNF, her husband will be helping her at home He provides transportation She has a RW and 3 in 1 at home She can afford her medications She has used [facility] previously and wants to use them again, I contacted Corene Cornea with [facility]        Expected Discharge Plan and Services                                                 Social Determinants of Health (SDOH) Interventions    Readmission Risk Interventions No flowsheet data found.

## 2020-07-02 NOTE — Progress Notes (Signed)
  Subjective: 1 Day Post-Op Procedure(s) (LRB): TOTAL KNEE ARTHROPLASTY (Right) Patient reports pain as mild.   Patient is well, and has had no acute complaints or problems Does report pain in her back, she is s/p lumbar spine surgery. Plan is to go Home after hospital stay. Negative for chest pain and shortness of breath Fever: no Gastrointestinal:Negative for nausea and vomiting  Objective: Vital signs in last 24 hours: Temp:  [97.4 F (36.3 C)-98.6 F (37 C)] 98.4 F (36.9 C) (06/15 0502) Pulse Rate:  [45-66] 64 (06/15 0502) Resp:  [13-22] 18 (06/15 0502) BP: (115-156)/(62-88) 150/69 (06/15 0502) SpO2:  [96 %-100 %] 96 % (06/15 0502)  Intake/Output from previous day:  Intake/Output Summary (Last 24 hours) at 07/02/2020 0734 Last data filed at 07/02/2020 0502 Gross per 24 hour  Intake 1991.1 ml  Output 2005 ml  Net -13.9 ml    Intake/Output this shift: No intake/output data recorded.  Labs: Recent Labs    07/01/20 0713 07/02/20 0403  HGB 12.6 11.0*   Recent Labs    07/01/20 0713 07/02/20 0403  WBC  --  6.2  RBC  --  3.81*  HCT 37.0 33.1*  PLT  --  114*   Recent Labs    07/01/20 0713 07/02/20 0403  NA 142 137  K 4.0 4.4  CL 102 103  CO2  --  30  BUN 25* 19  CREATININE 0.90 0.77  GLUCOSE 75 112*  CALCIUM  --  8.4*   No results for input(s): LABPT, INR in the last 72 hours.   EXAM General - Patient is Alert, Appropriate, and Oriented Extremity - ABD soft Sensation intact distally Intact pulses distally Dorsiflexion/Plantar flexion intact Incision: scant drainage No cellulitis present Dressing/Incision - Mild bloody drainage to the honeycomb.  ACE wrap in place. Motor Function - intact, moving foot and toes well on exam.  Abdomen soft to palpation, intact bowel sounds.  Past Medical History:  Diagnosis Date   Arthritis    everywhere   Asthma    Bipolar disorder (HCC)    GERD (gastroesophageal reflux disease)    Hearing loss    History  of hiatal hernia 2011   treated with medication, no surgical repair   History of Sjogren's disease (HCC)    HLD (hyperlipidemia)    Hypertension    Hypothyroidism    per patient, not taking synthroid anymore   Incomplete bladder emptying    Memory loss    Obesity    Recurrent UTI    Ringing in ear, bilateral    Vision abnormalities     Assessment/Plan: 1 Day Post-Op Procedure(s) (LRB): TOTAL KNEE ARTHROPLASTY (Right) Active Problems:   Status post total knee replacement using cement, right  Estimated body mass index is 30.13 kg/m as calculated from the following:   Height as of this encounter: 5\' 9"  (1.753 m).   Weight as of this encounter: 92.5 kg. Advance diet Up with therapy D/C IV fluids when tolerating po intake.  Labs reviewed this AM. Hg 11.0 Work on BM.  Patient denies passing gas this AM. Up with therapy, sensation has returned to the right leg. Plan for possible d/c home with HHPT tomorrow pending progress with PT.  DVT Prophylaxis - Foot Pumps and Eliquis Weight-Bearing as tolerated to right leg  J. , PA-C [facility] 07/02/2020, 7:34 AM

## 2020-07-02 NOTE — Evaluation (Signed)
Physical Therapy Evaluation Patient Details Name: Jessica Norman MRN: [id number] DOB: [birth year] Today's Date: 07/02/2020   History of Present Illness  Per MD, pt is a 70 y.o. female who presents for follow-up of her right knee pain secondary to degenerative joint disease and is s/p R TKA. PMH includes asthma, bipolar disorder, anemia, HTN, neuropathy, L TKA, cervical disc surgery, and lumbar laminectomy.  Clinical Impression  Pt was pleasant and motivated to participate during the session. Pt SPO2 and HR WNL throughout session on room air. Pt required physical assistance to up to mod assistance +2 for transfers and is limited with functional mobility secondary to fatigue and general weakness. Pt will benefit from SNF upon discharge to safely address deficits listed in patient problem list for decreased caregiver assistance and eventual return to PLOF.      Follow Up Recommendations SNF    Equipment Recommendations  None recommended by PT    Recommendations for Other Services       Precautions / Restrictions Precautions Precautions: Fall Restrictions Weight Bearing Restrictions: Yes RLE Weight Bearing: Weight bearing as tolerated     Mobility  Bed Mobility Overal bed mobility: Needs Assistance Bed Mobility: Supine to Sit     Supine to sit: Supervision     General bed mobility comments: Pt used rails to perform supine<>sit.    Transfers Overall transfer level: Needs assistance Equipment used: Rolling walker (2 wheeled) Transfers: Sit to/from Stand Sit to Stand: Mod assist;+2 physical assistance         General transfer comment: Min-mod verbal cues for sequencing and foot placement. Pt attempted sit<>stand x2 from raised bed and required mod assist +2 on third attempt to achieve standing.  Ambulation/Gait Ambulation/Gait assistance: Min guard Gait Distance (Feet): 10 Feet Assistive device: Rolling walker (2 wheeled) Gait Pattern/deviations: Step-to  pattern;Decreased step length - left;Decreased stance time - right Gait velocity: decreased   General Gait Details: Verbal cues for sequencing. Pt performed forward/backward ambulation. Pt walked with very short step length requiring much effort and increased UE weight bearing to maintain upright posture.   Stairs            Wheelchair Mobility    Modified Rankin (Stroke Patients Only)       Balance Overall balance assessment: Needs assistance Sitting-balance support: Feet supported Sitting balance-Leahy Scale: Fair     Standing balance support: Bilateral upper extremity supported Standing balance-Leahy Scale: Poor Standing balance comment: Pt demonstrated increased UE weight bearing into walker to maintain upright posture.                             Pertinent Vitals/Pain Pain Assessment: 0-10 Pain Score: 3  Pain Location: R knee Pain Descriptors / Indicators: Aching;Sore Pain Intervention(s): Monitored during session;Premedicated before session;Repositioned;Ice applied    Home Living Family/patient expects to be discharged to:: Private residence Living Arrangements: Spouse/significant other Available Help at Discharge: Family;Other (Comment);Available 24 hours/day (Pt's spouse) Type of Home: House Home Access: Stairs to enter Entrance Stairs-Rails: Can reach both Entrance Stairs-Number of Steps: 3 Home Layout: Two level;Able to live on main level with bedroom/bathroom Home Equipment: Gilmer Mor - single point;Walker - 2 wheels;Bedside commode      Prior Function Level of Independence: Independent         Comments: Pt reports using SPC about 10% of time with community mobilization if she feels off balance. Pt reports 1 fall in the last year.  Hand Dominance        Extremity/Trunk Assessment   Upper Extremity Assessment Upper Extremity Assessment: Overall WFL for tasks assessed    Lower Extremity Assessment Lower Extremity Assessment: RLE  deficits/detail;Generalized weakness RLE Deficits / Details: Pt limited secondary to pain s/p TKA. Hip strength >3/5. RLE: Unable to fully assess due to pain RLE Sensation: WNL RLE Coordination: WNL    Cervical / Trunk Assessment Cervical / Trunk Assessment: Normal  Communication   Communication: No difficulties  Cognition Arousal/Alertness: Awake/alert Behavior During Therapy: WFL for tasks assessed/performed Overall Cognitive Status: Within Functional Limits for tasks assessed                                        General Comments      Exercises Total Joint Exercises Ankle Circles/Pumps: AROM;Right;10 reps;Supine Quad Sets: AROM;Strengthening;Right;15 reps;Supine Straight Leg Raises: AROM;AAROM;Strengthening;Right;10 reps;Other (comment) (Pt required AAROM initially and progress to AROM for last half of repitions.) Long Arc Quad: AROM;Strengthening;Right;10 reps Knee Flexion: AROM;Strengthening;Right;10 reps Goniometric ROM: R knee AROM 20-75 degrees Marching in Standing: AROM;Strengthening;Both;10 reps Other Exercises Other Exercises: HEP provided verbally and via handout. Other Exercises: Positioning education with patient and she verbalized understanding.   Assessment/Plan    PT Assessment Patient needs continued PT services  PT Problem List Decreased strength;Decreased range of motion;Decreased activity tolerance;Decreased balance;Decreased mobility;Pain;Decreased knowledge of use of DME       PT Treatment Interventions DME instruction;Gait training;Stair training;Functional mobility training;Therapeutic activities;Therapeutic exercise;Balance training;Patient/family education    PT Goals (Current goals can be found in the Care Plan section)  Acute Rehab PT Goals Patient Stated Goal: Walker better and be able to play with grandchildren PT Goal Formulation: With patient Time For Goal Achievement: 07/15/20 Potential to Achieve Goals: Good     Frequency BID   Barriers to discharge Other (comment) (None)      Co-evaluation               AM-PAC PT "6 Clicks" Mobility  Outcome Measure Help needed turning from your back to your side while in a flat bed without using bedrails?: A Little Help needed moving from lying on your back to sitting on the side of a flat bed without using bedrails?: A Little Help needed moving to and from a bed to a chair (including a wheelchair)?: A Lot Help needed standing up from a chair using your arms (e.g., wheelchair or bedside chair)?: A Lot Help needed to walk in hospital room?: A Lot Help needed climbing 3-5 steps with a railing? : A Lot 6 Click Score: 14    End of Session Equipment Utilized During Treatment: Gait belt Activity Tolerance: Patient tolerated treatment well;Patient limited by fatigue;Patient limited by pain Patient left: in chair;with call bell/phone within reach;with chair alarm set;with SCD's reapplied;Other (comment) (Polar Care applied to R LE) Nurse Communication: Mobility status PT Visit Diagnosis: Other abnormalities of gait and mobility (R26.89);Muscle weakness (generalized) (M62.81);History of falling (Z91.81);Pain Pain - Right/Left: Right Pain - part of body: Knee    Time: 8413-2440 PT Time Calculation (min) (ACUTE ONLY): 55 min   Charges:              Desiree Hane SPT 07/02/20, 1:31 PM

## 2020-07-02 NOTE — Progress Notes (Signed)
Initial Nutrition Assessment  DOCUMENTATION CODES:  Not applicable  INTERVENTION:  Recommend removing carb mod diet restriction - spoke with MD.  Add Magic cup TID with meals, each supplement provides 290 kcal and 9 grams of protein.  Continue MVI with minerals daily.  NUTRITION DIAGNOSIS:  Increased nutrient needs related to post-op healing as evidenced by estimated needs.  GOAL:  Patient will meet greater than or equal to 90% of their needs  MONITOR:  PO intake, Supplement acceptance, Diet advancement, Labs, Weight trends, Skin, I & O's  REASON FOR ASSESSMENT:  Malnutrition Screening Tool    ASSESSMENT:  70 yo female with a PMH of HTN, memory loss, asthma, GERD, bipolar disorder, recurrent UTIs, HLD, arthritis, and hypothyroidism who presents with R knee pain 2/2 degenerative joint disease. 6/14 - total knee replacement  Spoke with pt at bedside. Pt reports eating well at home. She has eggs/bacon, smoothies for breakfast, lunch is a salad and crackers, and dinner is meat/vegetable/starch, pizza, etc. She denies any appetite changes. She has been adding more water into her diet.  Pt reports ~8 lb weight loss over the last 2-3 weeks. Per Epic, pt has lost ~7 lbs (3.3%) in 11 days, which is significant and severe for the time frame.  On exam, pt has no significant depletions.  Given above information, pt is not currently malnourished, but is at risk given significant weight loss in short period of time.  Recommend adding Magic Cup TID and continuing MVI with minerals for healing.   Also recommend removing Carb Modified restriction given pt does not have a history of diabetes and is on steroids, likely increasing her blood glucose levels.  Medications: reviewed; Depakote, colace BID, Synthroid, MVI with minerals, omeprazole, NaCl @ 75 ml/hr, oxycodone PRN (given once today)  Labs: reviewed; Glucose 112 (H)  NUTRITION - FOCUSED PHYSICAL EXAM: Flowsheet Row Most Recent Value   Orbital Region No depletion  Upper Arm Region No depletion  Thoracic and Lumbar Region No depletion  Buccal Region No depletion  Temple Region No depletion  Clavicle Bone Region No depletion  Clavicle and Acromion Bone Region No depletion  Scapular Bone Region No depletion  Dorsal Hand Mild depletion  Patellar Region No depletion  Anterior Thigh Region No depletion  Posterior Calf Region No depletion  Edema (RD Assessment) None  Hair Reviewed  Eyes Reviewed  Mouth Reviewed  Skin Reviewed  Nails Reviewed   Diet Order:   Diet Order             Diet Heart Room service appropriate? Yes; Fluid consistency: Thin  Diet effective now                  EDUCATION NEEDS:  Education needs have been addressed  Skin:  Skin Assessment: Skin Integrity Issues: Skin Integrity Issues:: Incisions Incisions: R knee, closed  Last BM:  07/01/20  Height:  Ht Readings from Last 1 Encounters:  07/01/20 5\' 9"  (1.753 m)   Weight:  Wt Readings from Last 1 Encounters:  07/01/20 92.5 kg   Ideal Body Weight:  66 kg  BMI:  Body mass index is 30.13 kg/m.  Estimated Nutritional Needs:  Kcal:  1800-2000 Protein:  115-130 grams Fluid:  >1.8 L  07/03/20, RD, LDN Registered Dietitian I After-Hours/Weekend Pager # in [place]

## 2020-07-02 NOTE — Anesthesia Postprocedure Evaluation (Signed)
Anesthesia Post Note  Patient: Jessica Norman  Procedure(s) Performed: TOTAL KNEE ARTHROPLASTY (Right: Knee)  Patient location during evaluation: Nursing Unit Anesthesia Type: Spinal Level of consciousness: awake, oriented and awake and alert Pain management: pain level controlled Vital Signs Assessment: post-procedure vital signs reviewed and stable Respiratory status: spontaneous breathing, nonlabored ventilation and respiratory function stable Cardiovascular status: blood pressure returned to baseline and stable Postop Assessment: no headache and no backache Comments: Pt States it took a lot longer for my right leg to wake up but its awake now.   No notable events documented.   Last Vitals:  Vitals:   07/02/20 0502 07/02/20 0757  BP: (!) 150/69 (!) 129/55  Pulse: 64 (!) 57  Resp: 18 16  Temp: 36.9 C 36.8 C  SpO2: 96% 93%    Last Pain:  Vitals:   07/02/20 0757  TempSrc: Oral  PainSc:                  Ginger Carne

## 2020-07-02 NOTE — Progress Notes (Signed)
Physical Therapy Treatment Patient Details Name: Jessica Norman MRN: [id number] DOB: 03/30/1950 Today's Date: 07/02/2020    History of Present Illness Per MD, pt is a 70 y.o. female who presents for follow-up of her right knee pain secondary to degenerative joint disease and is s/p R TKA. PMH includes asthma, bipolar disorder, anemia, HTN, neuropathy, L TKA, cervical disc surgery, and lumbar laminectomy.    PT Comments    Pt was pleasant and willing to participate after discussing physiological benefits of exercise for her recovery process. Pt required less physical assistance to perform sit<>stand transfer but still unable to independently. Pt demonstrated increased speed and stability while ambulating by increasing step length and decreasing weight bearing of UEs with RW. Pt is making progress towards goals. Pt will benefit from SNF upon discharge to safely address deficits listed in patient problem list for decreased caregiver assistance and eventual return to PLOF.   Follow Up Recommendations  SNF     Equipment Recommendations  None recommended by PT    Recommendations for Other Services       Precautions / Restrictions Precautions Precautions: Fall Restrictions Weight Bearing Restrictions: Yes RLE Weight Bearing: Weight bearing as tolerated    Mobility  Bed Mobility Overal bed mobility: Needs Assistance Bed Mobility: Supine to Sit     Supine to sit: Supervision     General bed mobility comments: Pt used rails to perform supine<>sit.    Transfers Overall transfer level: Needs assistance Equipment used: Rolling walker (2 wheeled) Transfers: Sit to/from Stand Sit to Stand: Min assist         General transfer comment: Min-mod verbal cues for sequencing and foot placement. Pt unable to stand from recliner without assistance using RW.  Ambulation/Gait Ambulation/Gait assistance: Min guard Gait Distance (Feet): 20 Feet Assistive device: Rolling walker (2  wheeled) Gait Pattern/deviations: Step-to pattern;Decreased step length - left;Decreased stance time - right Gait velocity: decreased   General Gait Details: Verbal cues for sequencing and looking forward. Pt intitally ambulated with very decreased step length and gradually increased towards end of walking   Stairs             Wheelchair Mobility    Modified Rankin (Stroke Patients Only)       Balance Overall balance assessment: Needs assistance Sitting-balance support: Feet supported Sitting balance-Leahy Scale: Fair     Standing balance support: Bilateral upper extremity supported Standing balance-Leahy Scale: Fair                              Cognition Arousal/Alertness: Awake/alert Behavior During Therapy: WFL for tasks assessed/performed Overall Cognitive Status: Within Functional Limits for tasks assessed                                        Exercises Total Joint Exercises Quad Sets: AROM;Strengthening;Right;10 reps;Supine Heel Slides: AROM;Strengthening;Right;10 reps Hip ABduction/ADduction: AROM;Strengthening;Right;10 reps;Supine;AAROM (Hand under heel to decrease reistance.) Long Arc Quad: AROM;Strengthening;Right;10 reps;Seated Knee Flexion: AROM;Strengthening;Right;10 reps;Seated Marching in Standing: AROM;Strengthening;Both;10 reps;Standing Other Exercises Other Exercises: Reviewed HEP verbally and via handout and pt verbalized understanding.    General Comments        Pertinent Vitals/Pain Pain Assessment: 0-10 Pain Score: 5  Pain Location: R knee Pain Descriptors / Indicators: Aching;Sore Pain Intervention(s): Monitored during session;Premedicated before session;Repositioned;Ice applied    Home Living  Prior Function            PT Goals (current goals can now be found in the care plan section) Progress towards PT goals: Progressing toward goals    Frequency    BID       PT Plan Current plan remains appropriate    Co-evaluation              AM-PAC PT "6 Clicks" Mobility   Outcome Measure  Help needed turning from your back to your side while in a flat bed without using bedrails?: A Little Help needed moving from lying on your back to sitting on the side of a flat bed without using bedrails?: A Little Help needed moving to and from a bed to a chair (including a wheelchair)?: A Little Help needed standing up from a chair using your arms (e.g., wheelchair or bedside chair)?: A Lot Help needed to walk in hospital room?: A Little Help needed climbing 3-5 steps with a railing? : A Lot 6 Click Score: 16    End of Session Equipment Utilized During Treatment: Gait belt Activity Tolerance: Patient tolerated treatment well Patient left: in chair;with call bell/phone within reach;with chair alarm set;with SCD's reapplied;Other (comment) (Polar care applied to R LE) Nurse Communication: Mobility status PT Visit Diagnosis: Other abnormalities of gait and mobility (R26.89);Muscle weakness (generalized) (M62.81);History of falling (Z91.81);Pain Pain - Right/Left: Right Pain - part of body: Knee     Time: 9562-1308 PT Time Calculation (min) (ACUTE ONLY): 43 min  Charges:                        Desiree Hane SPT 07/02/20, 5:41 PM

## 2020-07-03 LAB — CBC
HCT: 31.1 % — ABNORMAL LOW (ref 36.0–46.0)
Hemoglobin: 10.2 g/dL — ABNORMAL LOW (ref 12.0–15.0)
MCH: 28.6 pg (ref 26.0–34.0)
MCHC: 32.8 g/dL (ref 30.0–36.0)
MCV: 87.1 fL (ref 80.0–100.0)
Platelets: 108 10*3/uL — ABNORMAL LOW (ref 150–400)
RBC: 3.57 MIL/uL — ABNORMAL LOW (ref 3.87–5.11)
RDW: 13.2 % (ref 11.5–15.5)
WBC: 8.1 10*3/uL (ref 4.0–10.5)
nRBC: 0 % (ref 0.0–0.2)

## 2020-07-03 LAB — BASIC METABOLIC PANEL
Anion gap: 6 (ref 5–15)
BUN: 18 mg/dL (ref 8–23)
CO2: 29 mmol/L (ref 22–32)
Calcium: 8.6 mg/dL — ABNORMAL LOW (ref 8.9–10.3)
Chloride: 99 mmol/L (ref 98–111)
Creatinine, Ser: 0.73 mg/dL (ref 0.44–1.00)
GFR, Estimated: >60 mL/min (ref >60)
Glucose, Bld: 104 mg/dL — ABNORMAL HIGH (ref 70–99)
Potassium: 4.2 mmol/L (ref 3.5–5.1)
Sodium: 134 mmol/L — ABNORMAL LOW (ref 135–145)

## 2020-07-03 MED ORDER — ONDANSETRON HCL 4 MG PO TABS: 4.0000 mg | ORAL_TABLET | Freq: Four times a day (QID) | ORAL | 0 refills | Status: DC | PRN

## 2020-07-03 MED ORDER — OXYCODONE HCL 5 MG PO TABS: 5.0000 mg | ORAL_TABLET | ORAL | 0 refills | Status: DC | PRN

## 2020-07-03 MED ORDER — TRAMADOL HCL 50 MG PO TABS: 50.0000 mg | ORAL_TABLET | Freq: Four times a day (QID) | ORAL | 0 refills | Status: DC | PRN

## 2020-07-03 MED ORDER — LACTULOSE 10 GM/15ML PO SOLN: 20.0000 g | Freq: Every day | ORAL | Status: DC | PRN

## 2020-07-03 MED ORDER — APIXABAN 2.5 MG PO TABS: 2.5000 mg | ORAL_TABLET | Freq: Two times a day (BID) | ORAL | 0 refills | Status: DC

## 2020-07-03 NOTE — Evaluation (Signed)
Occupational Therapy Evaluation Patient Details Name: Jessica Norman MRN: [id number] DOB: Feb 23, 1950 Today's Date: 07/03/2020    History of Present Illness Pt is a 70 y.o. female who presents for follow-up of her right knee pain secondary to degenerative joint disease and is s/p R TKA. PMH includes asthma, bipolar disorder, anemia, HTN, neuropathy, L TKA, cervical disc surgery, and lumbar laminectomy.   Clinical Impression   Ms. Devargas presents today with generalized weakness, limited endurance, poor balance, and 6-8/10 pain. She is extremely lethargic closing her eyes frequently during session, and often taking >1 min before responding to simple questions. Provided education re: polar care, TED hose, falls prevention, home modifications, and pt verbalized understanding; however, additional reinforcement could be helpful, given low level of pt responsiveness throughout the session. Pt required Max A + 2 for sit<>stand x 3 attempts, and Max A for sit<supine and positioning in bed. RN notified of pt's lethargy. Pt states she intends to DC to home; however, based on her performance this AM, recommending SNF level care.     Follow Up Recommendations  SNF    Equipment Recommendations  None recommended by OT    Recommendations for Other Services       Precautions / Restrictions Precautions Precautions: Fall Restrictions Weight Bearing Restrictions: Yes RLE Weight Bearing: Weight bearing as tolerated      Mobility Bed Mobility Overal bed mobility: Needs Assistance Bed Mobility: Sit to Supine     Supine to sit: Min assist Sit to supine: Max assist   General bed mobility comments: Required Max A for b/l LE placement    Transfers Overall transfer level: Needs assistance Equipment used: Rolling walker (2 wheeled) Transfers: Sit to/from [employer];Lateral/Scoot Transfers Sit to Stand: Max assist;+2 physical assistance Stand pivot transfers: Max assist;+2 physical  assistance      Lateral/Scoot Transfers: Mod assist General transfer comment: Pt able to participate only minimally in transfering from recliner to bed    Balance Overall balance assessment: Needs assistance Sitting-balance support: Feet supported Sitting balance-Leahy Scale: Fair     Standing balance support: Bilateral upper extremity supported Standing balance-Leahy Scale: Poor Standing balance comment: heavy reliance on UE and +2 assistance for standing, unable to achieve full upright posture                           ADL either performed or assessed with clinical judgement   ADL Overall ADL's : Needs assistance/impaired Eating/Feeding: Supervision/ safety;Set up                   Lower Body Dressing: Maximal assistance               Functional mobility during ADLs: Maximal assistance;+2 for physical assistance       Vision Patient Visual Report: No change from baseline       Perception     Praxis      Pertinent Vitals/Pain Pain Assessment: Faces Pain Score: 6  Faces Pain Scale: Hurts little more Pain Location: R knee Pain Descriptors / Indicators: Aching;Sore Pain Intervention(s): Limited activity within patient's tolerance;Monitored during session;Premedicated before session;Repositioned;Ice applied     Hand Dominance     Extremity/Trunk Assessment Upper Extremity Assessment Upper Extremity Assessment: Overall WFL for tasks assessed   Lower Extremity Assessment Lower Extremity Assessment: RLE deficits/detail RLE Deficits / Details: Pt limited secondary to pain s/p TKA. Hip strength >3/5. RLE: Unable to fully assess due to pain  Communication Communication Communication: Expressive difficulties   Cognition Arousal/Alertness: Lethargic Behavior During Therapy: Flat affect Overall Cognitive Status: No family/caregiver present to determine baseline cognitive functioning                                  General Comments: flat affect, very slow speech pattern, responding primarily in monosyllables   General Comments       Exercises Total Joint Exercises Quad Sets: AROM;Strengthening;Right;10 reps;Supine Heel Slides: AROM;Strengthening;Right;10 reps Hip ABduction/ADduction: AROM;Strengthening;Right;10 reps;Supine;AAROM (Hand under heel to decrease reistance.) Long Arc Quad: AROM;Strengthening;Right;10 reps;Seated Knee Flexion: AROM;Strengthening;Right;10 reps;Seated Goniometric ROM: 15-95 Marching in Standing: AROM;Strengthening;Both;10 reps;Standing Other Exercises Other Exercises: Provided educ re: polar care mgmt, TED hose mgmt, home/routines modifications, falls prevention   Shoulder Instructions      Home Living Family/patient expects to be discharged to:: Private residence Living Arrangements: Spouse/significant other Available Help at Discharge: Family;Available 24 hours/day Type of Home: House Home Access: Stairs to enter   Entrance Stairs-Rails: Can reach both Home Layout: Two level;Able to live on main level with bedroom/bathroom     Bathroom Shower/Tub: Producer, television/film/video: Handicapped height     Home Equipment: Cane - single point;Walker - 2 wheels;Bedside commode          Prior Functioning/Environment Level of Independence: Independent        Comments: Pt reports using SPC about 10% of time with community mobilization if she feels off balance. Pt reports 1 fall in the last year.        OT Problem List: Decreased strength;Decreased knowledge of use of DME or AE;Decreased range of motion;Decreased coordination;Decreased activity tolerance;Impaired balance (sitting and/or standing);Pain      OT Treatment/Interventions: Self-care/ADL training;Patient/family education;Therapeutic exercise;Neuromuscular education;Balance training;Energy conservation;DME and/or AE instruction;Therapeutic activities    OT Goals(Current goals can be found in the  care plan section) Acute Rehab OT Goals Patient Stated Goal: Less pain OT Goal Formulation: With patient Time For Goal Achievement: 07/17/20 Potential to Achieve Goals: Good ADL Goals Pt Will Perform Lower Body Bathing: with supervision;sitting/lateral leans Pt Will Transfer to Toilet: with supervision;stand pivot transfer Pt Will Perform Toileting - Clothing Manipulation and hygiene: with supervision;sit to/from stand  OT Frequency: Min 1X/week   Barriers to D/C:            Co-evaluation              AM-PAC OT "6 Clicks" Daily Activity     Outcome Measure Help from another person eating meals?: None Help from another person taking care of personal grooming?: A Little Help from another person toileting, which includes using toliet, bedpan, or urinal?: A Lot Help from another person bathing (including washing, rinsing, drying)?: A Lot Help from another person to put on and taking off regular upper body clothing?: A Little Help from another person to put on and taking off regular lower body clothing?: A Lot 6 Click Score: 16   End of Session Equipment Utilized During Treatment: Rolling walker Nurse Communication: Mobility status;Other (comment) (high level of lethargy)  Activity Tolerance: Patient limited by lethargy;Patient limited by fatigue Patient left: in bed;with call bell/phone within reach;with bed alarm set;with nursing/sitter in room  OT Visit Diagnosis: Unsteadiness on feet (R26.81);Other abnormalities of gait and mobility (R26.89);Muscle weakness (generalized) (M62.81);Pain Pain - Right/Left: Right Pain - part of body: Knee  Time: 2111-7356 OT Time Calculation (min): 36 min Charges:  OT General Charges $OT Visit: 1 Visit OT Evaluation $OT Eval Moderate Complexity: 1 Mod OT Treatments $Self Care/Home Management : 23-37 mins Latina Craver, PhD, MS, OTR/L 07/03/20, 1:48 PM

## 2020-07-03 NOTE — Progress Notes (Signed)
Subjective: 2 Days Post-Op Procedure(s) (LRB): TOTAL KNEE ARTHROPLASTY (Right) Patient reports pain as mild.   Patient is well, and has had no acute complaints or problems Does report pain in her back, she is s/p lumbar spine surgery. Plan is to go Home after hospital stay.  PT is currently recommending SNF however patient states she plans on going home with HHPT. Negative for chest pain and shortness of breath Fever: no Gastrointestinal:Negative for nausea and vomiting  Objective: Vital signs in last 24 hours: Temp:  [97.7 F (36.5 C)-99 F (37.2 C)] 99 F (37.2 C) (06/16 0433) Pulse Rate:  [63-77] 76 (06/16 0433) Resp:  [16-18] 17 (06/16 0433) BP: (114-175)/(59-75) 175/66 (06/16 0433) SpO2:  [90 %-99 %] 99 % (06/16 0433)  Intake/Output from previous day:  Intake/Output Summary (Last 24 hours) at 07/03/2020 0808 Last data filed at 07/02/2020 1255 Gross per 24 hour  Intake 757.39 ml  Output --  Net 757.39 ml    Intake/Output this shift: No intake/output data recorded.  Labs: Recent Labs    07/01/20 0713 07/02/20 0403 07/03/20 0343  HGB 12.6 11.0* 10.2*   Recent Labs    07/02/20 0403 07/03/20 0343  WBC 6.2 8.1  RBC 3.81* 3.57*  HCT 33.1* 31.1*  PLT 114* 108*   Recent Labs    07/02/20 0403 07/03/20 0343  NA 137 134*  K 4.4 4.2  CL 103 99  CO2 30 29  BUN 19 18  CREATININE 0.77 0.73  GLUCOSE 112* 104*  CALCIUM 8.4* 8.6*   No results for input(s): LABPT, INR in the last 72 hours.   EXAM General - Patient is Alert, Appropriate, and Oriented Extremity - ABD soft Sensation intact distally Intact pulses distally Dorsiflexion/Plantar flexion intact Incision: scant drainage No cellulitis present Polar care was unplugged this morning.  Re-positioned and plugged back into wall for the patient. Dressing/Incision - Mild bloody drainage to the honeycomb.  ACE wrap in place. Motor Function - intact, moving foot and toes well on exam.  Abdomen soft to  palpation, intact bowel sounds. Negative Homan's to bilateral lower extremities.  Past Medical History:  Diagnosis Date   Arthritis    everywhere   Asthma    Bipolar disorder (HCC)    GERD (gastroesophageal reflux disease)    Hearing loss    History of hiatal hernia 2011   treated with medication, no surgical repair   History of Sjogren's disease (HCC)    HLD (hyperlipidemia)    Hypertension    Hypothyroidism    per patient, not taking synthroid anymore   Incomplete bladder emptying    Memory loss    Obesity    Recurrent UTI    Ringing in ear, bilateral    Vision abnormalities     Assessment/Plan: 2 Days Post-Op Procedure(s) (LRB): TOTAL KNEE ARTHROPLASTY (Right) Active Problems:   Status post total knee replacement using cement, right  Estimated body mass index is 30.13 kg/m as calculated from the following:   Height as of this encounter: 5\' 9"  (1.753 m).   Weight as of this encounter: 92.5 kg. Advance diet Up with therapy   Labs reviewed this AM. Hg 10.2.  Low grade temp last night, WBC 8.1, encouraged incentive spirometer. I did not see one in the room so a new one was provided. Continue to work on a BM.  Lactulose added to bowel regimen today. Continue with therapy today, if she does enough with therapy can plan for d/c home today.  DVT Prophylaxis -  Foot Pumps, TED hose, and Eliquis Weight-Bearing as tolerated to right leg  J. Horris Latino, PA-C [facility] 07/03/2020, 8:08 AM

## 2020-07-03 NOTE — Progress Notes (Signed)
Physical Therapy Treatment Patient Details Name: Jessica Norman MRN: [id number] DOB: Aug 12, 1950 Today's Date: 07/03/2020    History of Present Illness Per MD, pt is a 70 y.o. female who presents for follow-up of her right knee pain secondary to degenerative joint disease and is s/p R TKA. PMH includes asthma, bipolar disorder, anemia, HTN, neuropathy, L TKA, cervical disc surgery, and lumbar laminectomy.    PT Comments    Participated in exercises as described below.  To EOB where she required min a x 1 today and heavy cues for hand placements.  Steady in sitting.  Pt asked for commode to be placed next to bed but encouraged to leave at wall at end of bed to encourage gait.  Agreed.  Stood with heavy mod a x 1 and cues. Needed 2 attempts to successfully stand.  She is able to hesitantly walk 5' to commode then 10' to recliner with very slow gait and decreased step height and length but no LOB/buckling.  She did not feel she could walk further.  Pt with excellent knee flexion but remains lacking in extension.  Discussed importance of positioning and quad sets during the day.  Voiced understanding.  Pt continues to state she wishes to return home vs SNF.  She will need to make significant increases in mobility as she has yet to ambulate functional distances and requires assist for mobility.  Will continue to discuss and progress as tolerated.   Follow Up Recommendations  SNF     Equipment Recommendations  None recommended by PT    Recommendations for Other Services       Precautions / Restrictions Precautions Precautions: Fall Restrictions Weight Bearing Restrictions: Yes RLE Weight Bearing: Weight bearing as tolerated    Mobility  Bed Mobility Overal bed mobility: Needs Assistance Bed Mobility: Supine to Sit     Supine to sit: Min assist     General bed mobility comments: rails and min assist for RLE as she struggled on her own today.  Heavy cues for hand placements     Transfers Overall transfer level: Needs assistance Equipment used: Rolling walker (2 wheeled) Transfers: Sit to/from Stand Sit to Stand: Min assist;Mod assist         General transfer comment: mod assist from bed but min from commode.  cues and increased time.  Ambulation/Gait Ambulation/Gait assistance: Min guard;Min assist Gait Distance (Feet): 10 Feet Assistive device: Rolling walker (2 wheeled) Gait Pattern/deviations: Step-to pattern;Decreased step length - left;Decreased stance time - right Gait velocity: decreased   General Gait Details: slow gait 5' to commode then 10' to recliner   Stairs             Wheelchair Mobility    Modified Rankin (Stroke Patients Only)       Balance Overall balance assessment: Needs assistance Sitting-balance support: Feet supported Sitting balance-Leahy Scale: Fair     Standing balance support: Bilateral upper extremity supported Standing balance-Leahy Scale: Fair                              Cognition Arousal/Alertness: Awake/alert Behavior During Therapy: WFL for tasks assessed/performed Overall Cognitive Status: Within Functional Limits for tasks assessed                                        Exercises Total Joint Exercises Quad Sets:  AROM;Strengthening;Right;10 reps;Supine Heel Slides: AROM;Strengthening;Right;10 reps Hip ABduction/ADduction: AROM;Strengthening;Right;10 reps;Supine;AAROM (Hand under heel to decrease reistance.) Long Arc Quad: AROM;Strengthening;Right;10 reps;Seated Knee Flexion: AROM;Strengthening;Right;10 reps;Seated Goniometric ROM: 15-95 Marching in Standing: AROM;Strengthening;Both;10 reps;Standing    General Comments        Pertinent Vitals/Pain Pain Assessment: Faces Faces Pain Scale: Hurts little more Pain Location: R knee Pain Descriptors / Indicators: Aching;Sore Pain Intervention(s): Limited activity within patient's tolerance;Monitored during  session;Premedicated before session;Repositioned;Ice applied    Home Living                      Prior Function            PT Goals (current goals can now be found in the care plan section) Progress towards PT goals: Progressing toward goals    Frequency    BID      PT Plan Current plan remains appropriate    Co-evaluation              AM-PAC PT "6 Clicks" Mobility   Outcome Measure  Help needed turning from your back to your side while in a flat bed without using bedrails?: A Little Help needed moving from lying on your back to sitting on the side of a flat bed without using bedrails?: A Little Help needed moving to and from a bed to a chair (including a wheelchair)?: A Little Help needed standing up from a chair using your arms (e.g., wheelchair or bedside chair)?: A Lot Help needed to walk in hospital room?: A Little Help needed climbing 3-5 steps with a railing? : A Lot 6 Click Score: 16    End of Session Equipment Utilized During Treatment: Gait belt Activity Tolerance: Patient tolerated treatment well Patient left: in chair;with call bell/phone within reach;with chair alarm set;with SCD's reapplied;Other (comment) (Polar care applied to R LE) Nurse Communication: Mobility status PT Visit Diagnosis: Other abnormalities of gait and mobility (R26.89);Muscle weakness (generalized) (M62.81);History of falling (Z91.81);Pain Pain - Right/Left: Right Pain - part of body: Knee     Time: 3419-3790 PT Time Calculation (min) (ACUTE ONLY): 28 min  Charges:  $Gait Training: 8-22 mins $Therapeutic Exercise: 8-22 mins                    Danielle Dess, PTA 07/03/20, 10:07 AM , 10:03 AM

## 2020-07-03 NOTE — Discharge Instructions (Signed)

## 2020-07-03 NOTE — Progress Notes (Signed)
Physical Therapy Treatment Patient Details Name: Jessica Norman MRN: [id number] DOB: Aug 29, 1950 Today's Date: 07/03/2020    History of Present Illness Pt is a 70 y.o. female who presents for follow-up of her right knee pain secondary to degenerative joint disease and is s/p R TKA. PMH includes asthma, bipolar disorder, anemia, HTN, neuropathy, L TKA, cervical disc surgery, and lumbar laminectomy.    PT Comments    Pt ready for session. To EOB with significant increase in time and needing verbal and tactile cues.  Steady in sitting she tries to stand over 15 minutes but is unable to coordinate movements and sequencing with or without assist/cues.  She moves hands from bed <-> walker handles and changes feet position, rocking and counting.  She does state she feels "funny" and attributes it to medication.  Meds reviewed and talked with RN.  She does seem to have some increased difficulty word finding which she said she also sees.  When asked questions, she sometimes fails to answer like she forgets she is in a conversation and needs cues to continue.    Discussed with RN and DM to MD/RN.  While pt was slower to respond this am, it seems more so this afternoon.     Follow Up Recommendations  SNF     Equipment Recommendations       Recommendations for Other Services       Precautions / Restrictions Precautions Precautions: Fall Restrictions Weight Bearing Restrictions: Yes RLE Weight Bearing: Weight bearing as tolerated    Mobility  Bed Mobility Overal bed mobility: Needs Assistance Bed Mobility: Supine to Sit;Sit to Supine     Supine to sit: Min assist Sit to supine: Mod assist   General bed mobility comments: Required Max A for b/l LE placement    Transfers Overall transfer level: Needs assistance Equipment used: Rolling walker (2 wheeled) Transfers: Sit to/from [employer];Lateral/Scoot Transfers Sit to Stand: Max assist;+2 physical assistance Stand  pivot transfers: Max assist;+2 physical assistance      Lateral/Scoot Transfers: Mod assist General transfer comment: unable to coordinate movements to stand  Ambulation/Gait                 Stairs             Wheelchair Mobility    Modified Rankin (Stroke Patients Only)       Balance Overall balance assessment: Needs assistance Sitting-balance support: Feet supported Sitting balance-Leahy Scale: Fair     Standing balance support: Bilateral upper extremity supported Standing balance-Leahy Scale: Poor Standing balance comment: heavy reliance on UE and +2 assistance for standing, unable to achieve full upright posture                            Cognition Arousal/Alertness: Awake/alert Behavior During Therapy: Flat affect Overall Cognitive Status: No family/caregiver present to determine baseline cognitive functioning                                 General Comments: flat affect, very slow speech pattern, responding primarily in monosyllables      Exercises Other Exercises Other Exercises: Provided educ re: polar care mgmt, TED hose mgmt, home/routines modifications, falls prevention    General Comments        Pertinent Vitals/Pain Pain Assessment: Faces Pain Score: 6  Faces Pain Scale: Hurts a little bit Pain Location: R knee  Pain Descriptors / Indicators: Aching;Sore Pain Intervention(s): Limited activity within patient's tolerance;Monitored during session;Premedicated before session;Repositioned    Home Living Family/patient expects to be discharged to:: Private residence Living Arrangements: Spouse/significant other Available Help at Discharge: Family;Available 24 hours/day Type of Home: House Home Access: Stairs to enter Entrance Stairs-Rails: Can reach both Home Layout: Two level;Able to live on main level with bedroom/bathroom Home Equipment: Gilmer Mor - single point;Walker - 2 wheels;Bedside commode      Prior  Function Level of Independence: Independent      Comments: Pt reports using SPC about 10% of time with community mobilization if she feels off balance. Pt reports 1 fall in the last year.   PT Goals (current goals can now be found in the care plan section) Acute Rehab PT Goals Patient Stated Goal: Less pain Progress towards PT goals: Progressing toward goals    Frequency    BID      PT Plan Current plan remains appropriate    Co-evaluation              AM-PAC PT "6 Clicks" Mobility   Outcome Measure  Help needed turning from your back to your side while in a flat bed without using bedrails?: A Little Help needed moving from lying on your back to sitting on the side of a flat bed without using bedrails?: A Little Help needed moving to and from a bed to a chair (including a wheelchair)?: A Lot Help needed standing up from a chair using your arms (e.g., wheelchair or bedside chair)?: A Lot Help needed to walk in hospital room?: A Lot Help needed climbing 3-5 steps with a railing? : Total 6 Click Score: 13    End of Session Equipment Utilized During Treatment: Gait belt Activity Tolerance: Other (comment) Patient left: in bed;with call bell/phone within reach;with bed alarm set;with SCD's reapplied Nurse Communication: Mobility status;Other (comment) PT Visit Diagnosis: Other abnormalities of gait and mobility (R26.89);Muscle weakness (generalized) (M62.81);History of falling (Z91.81);Pain Pain - Right/Left: Right Pain - part of body: Knee     Time: 1455-1530 PT Time Calculation (min) (ACUTE ONLY): 35 min  Charges:  $Therapeutic Activity: 23-37 mins                    Danielle Dess, PTA 07/03/20, 3:37 PM , 3:33 PM

## 2020-07-04 LAB — BASIC METABOLIC PANEL
Anion gap: 8 (ref 5–15)
BUN: 18 mg/dL (ref 8–23)
CO2: 29 mmol/L (ref 22–32)
Calcium: 8.6 mg/dL — ABNORMAL LOW (ref 8.9–10.3)
Chloride: 95 mmol/L — ABNORMAL LOW (ref 98–111)
Creatinine, Ser: 0.74 mg/dL (ref 0.44–1.00)
GFR, Estimated: >60 mL/min (ref >60)
Glucose, Bld: 98 mg/dL (ref 70–99)
Potassium: 4.3 mmol/L (ref 3.5–5.1)
Sodium: 132 mmol/L — ABNORMAL LOW (ref 135–145)

## 2020-07-04 LAB — CBC
HCT: 28.4 % — ABNORMAL LOW (ref 36.0–46.0)
Hemoglobin: 9.5 g/dL — ABNORMAL LOW (ref 12.0–15.0)
MCH: 29.1 pg (ref 26.0–34.0)
MCHC: 33.5 g/dL (ref 30.0–36.0)
MCV: 87.1 fL (ref 80.0–100.0)
Platelets: 121 10*3/uL — ABNORMAL LOW (ref 150–400)
RBC: 3.26 MIL/uL — ABNORMAL LOW (ref 3.87–5.11)
RDW: 13.3 % (ref 11.5–15.5)
WBC: 9.1 10*3/uL (ref 4.0–10.5)
nRBC: 0 % (ref 0.0–0.2)

## 2020-07-04 NOTE — Care Management Important Message (Signed)
Important Message  Patient Details  Name: Jessica Norman MRN: [id number] Date of Birth: 18-Apr-1950   Medicare Important Message Given:  Yes     Olegario Messier A Laressa Bolinger 07/04/2020, 11:27 AM

## 2020-07-04 NOTE — Discharge Summary (Signed)
Physician Discharge Summary  Patient ID: DELONNA NEY MRN: [id number] DOB/AGE: [birth year] 70 y.o.  Admit date: 07/01/2020 Discharge date:  07/07/20  Admission Diagnoses:  Status post total knee replacement using cement, right [Z96.651]  Discharge Diagnoses: Patient Active Problem List   Diagnosis Date Noted   Status post total knee replacement using cement, right 07/01/2020   Lumbar stenosis 02/04/2020   Status post total knee replacement using cement, left 07/26/2017   Gait disturbance 05/25/2016   Abnormal brain MRI 05/25/2016   Memory loss 08/28/2015   Incontinence 05/06/2015   Numbness 11/25/2014   Insomnia 08/01/2014   Abdominal pain, left upper quadrant 07/16/2014   Abnormal finding on ultrasound 07/16/2014   Fatty liver disease, nonalcoholic 07/16/2014   Small intestinal bacterial overgrowth 07/16/2014   Asthma without status asthmaticus 06/20/2014   HLD (hyperlipidemia) 06/20/2014   Dermatitis, eczematoid 06/20/2014   Acid reflux 06/20/2014   Cephalalgia 06/20/2014   BP (high blood pressure) 06/20/2014   Adiposity 06/20/2014   Arthritis, degenerative 06/20/2014   Allergic rhinitis, seasonal 06/20/2014   Proximal limb weakness 06/20/2014   Proximal leg weakness 06/20/2014   Disturbed cognition 06/20/2014   Carpal tunnel syndrome 06/20/2014   Tinnitus 06/20/2014   Past Medical History:  Diagnosis Date   Arthritis    everywhere   Asthma    Bipolar disorder (HCC)    GERD (gastroesophageal reflux disease)    Hearing loss    History of hiatal hernia 2011   treated with medication, no surgical repair   History of Sjogren's disease (HCC)    HLD (hyperlipidemia)    Hypertension    Hypothyroidism    per patient, not taking synthroid anymore   Incomplete bladder emptying    Memory loss    Obesity    Recurrent UTI    Ringing in ear, bilateral    Vision abnormalities    Transfusion: None.   Consultants (if any):   Discharged Condition:  Improved  Hospital Course: GIANNE SHUGARS is an 70 y.o. female who was admitted 07/01/2020 with a diagnosis of degenerative joint disease of the right knee and went to the operating room on 07/01/2020 and underwent the above named procedures.    Surgeries: Procedure(s): TOTAL KNEE ARTHROPLASTY on 07/01/2020 Patient tolerated the surgery well. Taken to PACU where she was stabilized and then transferred to the orthopedic floor.  Started on Eliquis 2.5mg  twice daily. Foot pumps applied bilaterally at 80 mm. Heels elevated on bed with rolled towels. No evidence of DVT. Negative Homan. Physical therapy started on day #1 for gait training and transfer. OT started day #1 for ADL and assisted devices.  Patient steadily improved with therapy however was not deemed safe to return home.  SNF search was initiated on 07/04/20.  COVID test negative.  Patient's IV was removed on POD6  Implants: Right TKA using all-cemented [employer] Vanguard system with a 72.5 mm PCR femur, a 75 mm tibial tray with a 12 mm anterior stabilized E-poly insert, and a 34 x 8.5 mm all-poly 3-pegged domed patella.  She was given perioperative antibiotics:  Anti-infectives (From admission, onward)    Start     Dose/Rate Route Frequency Ordered Stop   07/01/20 2200  hydroxychloroquine (PLAQUENIL) tablet 200 mg        200 mg Oral 2 times daily 07/01/20 1152     07/01/20 1400  ceFAZolin (ANCEF) IVPB 2g/100 mL premix        2 g 200 mL/hr over 30 Minutes Intravenous Every 6  hours 07/01/20 1152 07/02/20 0714   07/01/20 0600  ceFAZolin (ANCEF) IVPB 2g/100 mL premix        2 g 200 mL/hr over 30 Minutes Intravenous On call to O.R. 07/01/20 0231 07/01/20 0757     .  She was given sequential compression devices, early ambulation, and Eliquis for DVT prophylaxis.  She benefited maximally from the hospital stay and there were no complications.    Recent vital signs:  Vitals:   07/06/20 2131 07/07/20 0413  BP: (!) 152/76 (!) 145/68   Pulse: 65 67  Resp: 18 18  Temp: 98.4 F (36.9 C) 98.1 F (36.7 C)  SpO2: 97% 99%   Recent laboratory studies:  Lab Results  Component Value Date   HGB 9.5 (L) 07/04/2020   HGB 10.2 (L) 07/03/2020   HGB 11.0 (L) 07/02/2020   Lab Results  Component Value Date   WBC 9.1 07/04/2020   PLT 121 (L) 07/04/2020   Lab Results  Component Value Date   INR 1.0 01/31/2020   Lab Results  Component Value Date   NA 132 (L) 07/04/2020   K 4.3 07/04/2020   CL 95 (L) 07/04/2020   CO2 29 07/04/2020   BUN 18 07/04/2020   CREATININE 0.74 07/04/2020   GLUCOSE 98 07/04/2020   Discharge Medications:   Allergies as of 07/07/2020       Reactions   Pantoprazole Itching   Throat and chest tightness   Codeine Itching   Penicillin G Itching   Has patient had a PCN reaction causing immediate rash, facial/tongue/throat swelling, SOB or lightheadedness with hypotension: No Has patient had a PCN reaction causing severe rash involving mucus membranes or skin necrosis: No Has patient had a PCN reaction that required hospitalization: No Has patient had a PCN reaction occurring within the last 10 years: No If all of the above answers are "NO", then may proceed with Cephalosporin use.   Sulfa Antibiotics Itching        Medication List     STOP taking these medications    acetaminophen 650 MG CR tablet Commonly known as: TYLENOL Replaced by: acetaminophen 500 MG tablet   aspirin 81 MG EC tablet       TAKE these medications    acetaminophen 500 MG tablet Commonly known as: TYLENOL Take 1-2 tablets (500-1,000 mg total) by mouth every 6 (six) hours as needed. Replaces: acetaminophen 650 MG CR tablet   aluminum hydroxide-magnesium carbonate 95-358 MG/15ML Susp Commonly known as: GAVISCON Take 15-30 mLs by mouth every 4 (four) hours as needed for heartburn or indigestion.   amLODipine 5 MG tablet Commonly known as: NORVASC Take 10 mg by mouth daily.   apixaban 2.5 MG Tabs  tablet Commonly known as: ELIQUIS Take 1 tablet (2.5 mg total) by mouth 2 (two) times daily.   carvedilol 6.25 MG tablet Commonly known as: COREG Take 6.25 mg by mouth 2 (two) times daily with a meal.   cycloSPORINE 0.05 % ophthalmic emulsion Commonly known as: RESTASIS Place 1 drop into both eyes 2 (two) times daily.   diclofenac Sodium 1 % Gel Commonly known as: VOLTAREN Apply 1 application topically 4 (four) times daily as needed (pain).   divalproex 500 MG 24 hr tablet Commonly known as: DEPAKOTE ER Take 1,000 mg by mouth every evening.   Female Urinary Pouch Misc Purewick external catheter   fexofenadine 180 MG tablet Commonly known as: ALLEGRA Take 180 mg by mouth daily.   hydroxychloroquine 200 MG tablet Commonly  known as: PLAQUENIL Take 200 mg by mouth 2 (two) times daily.   levothyroxine 50 MCG tablet Commonly known as: SYNTHROID Take 50 mcg by mouth daily before breakfast.   lisinopril-hydrochlorothiazide 20-25 MG tablet Commonly known as: ZESTORETIC Take 1 tablet by mouth daily.   lovastatin 10 MG tablet Commonly known as: MEVACOR Take 10 mg by mouth at bedtime.   multivitamin with minerals Tabs tablet Take 1 tablet by mouth daily.   omeprazole 40 MG capsule Commonly known as: PRILOSEC Take 40 mg by mouth daily.   ondansetron 4 MG tablet Commonly known as: ZOFRAN Take 1 tablet (4 mg total) by mouth every 6 (six) hours as needed for nausea.   Osteo Bi-Flex Triple Strength Tabs Take 2 tablets by mouth daily.   sertraline 100 MG tablet Commonly known as: ZOLOFT Take 100 mg by mouth daily.   solifenacin 5 MG tablet Commonly known as: VESICARE Take 5 mg by mouth daily.   sulindac 200 MG tablet Commonly known as: CLINORIL Take 200 mg by mouth 2 (two) times daily.   traMADol 50 MG tablet Commonly known as: ULTRAM Take 1-2 tablets (50-100 mg total) by mouth every 6 (six) hours as needed for moderate pain.   Xiidra 5 % Soln Generic drug:  Lifitegrast Place 1 drop into both eyes in the morning and at bedtime.               Durable Medical Equipment  (From admission, onward)           Start     Ordered   07/01/20 1153  DME Bedside commode  Once       Question:  Patient needs a bedside commode to treat with the following condition  Answer:  Status post total knee replacement using cement, right   07/01/20 1152   07/01/20 1153  DME 3 n 1  Once        07/01/20 1152   07/01/20 1153  DME Walker rolling  Once       Question Answer Comment  Walker: With 5 Inch Wheels   Patient needs a walker to treat with the following condition Status post total knee replacement using cement, right      07/01/20 1152           Diagnostic Studies: DG Knee Right Port  Result Date: 07/01/2020 CLINICAL DATA:  Postop knee replacement EXAM: PORTABLE RIGHT KNEE - 1-2 VIEW COMPARISON:  None. FINDINGS: Right knee replacement in satisfactory position and alignment. No fracture or complication. Gas and fluid in the knee joint. IMPRESSION: Satisfactory right knee replacement. Electronically Signed   By: Marlan Palau M.D.   On: 07/01/2020 10:40    Disposition: Continue with therapy today, patient has had a BM.  Plan for discharge to SNF today.   Follow-up Information     Anson Oregon, PA-C Follow up in 14 day(s).   Specialty: Physician Assistant Why: Mindi Slicker information: [address] [place] [address] [phone]                Signed: Meriel Pica PA-C 07/07/2020, 8:03 AM

## 2020-07-04 NOTE — Progress Notes (Addendum)
Physical Therapy Treatment Patient Details Name: Jessica Norman MRN: [id number] DOB: 06-12-1950 Today's Date: 07/04/2020    History of Present Illness Pt is a 70 y.o. female who presents for follow-up of her right knee pain secondary to degenerative joint disease and is s/p R TKA. PMH includes asthma, bipolar disorder, anemia, HTN, neuropathy, L TKA, cervical disc surgery, and lumbar laminectomy.    PT Comments    Pt awake in bed.  Participated in exercises as described below.  She transitions to EOB with increased time and assist.  When asked to do on her own she just lays there and stares off to right.  When cued and given assist she is able to progress legs to EOB but does not seem to be able to motor plan to raise upper body.  She does not initiate using BUE to assist.  Once sitting she is generally steady but continues to need supervision for overall safety.  She agree to try gait.  She is given walker but again has difficulty motor planning and initiating task.  She asks to try on her own but switches hands frequently from bed to walker.  Education provided.  She does not want cues or assist and will begin rocking but falls asleep while trying x 3 and needs cues to awaken and be reminded of task at hand.  She stated her head is in "television mode"  when questioned what she meant by that she stated is "sleepy".  Despite attempt with max a x 1 she is unable to stand and no muscle activation noted in BLE's during attempt.  She is assisted back to supine and again has trouble initiating task and leans straight back on bed.  Cues and assist to stop and to go down on elbow,  She seems to fall quickly back to sleep once supine.   Pt continues with unusual behavior/motor planning problems.  It was discussed with MD and RN yesterday. Session  SNF remains appropriate for discharge as she is unsafe and inconsistent with mobility and assist levels.      Follow Up Recommendations  SNF     Equipment  Recommendations  None recommended by PT    Recommendations for Other Services       Precautions / Restrictions Precautions Precautions: Fall Restrictions Weight Bearing Restrictions: Yes RLE Weight Bearing: Weight bearing as tolerated    Mobility  Bed Mobility Overal bed mobility: Needs Assistance Bed Mobility: Supine to Sit;Sit to Supine     Supine to sit: Min assist;Mod assist Sit to supine: Mod assist   General bed mobility comments: extensive cues, increased time to complete.  poor sequencing    Transfers Overall transfer level: Needs assistance Equipment used: Rolling walker (2 wheeled) Transfers: Sit to/from Stand Sit to Stand: Total assist         General transfer comment: unable to coordinate movements to stand this pm with +1 asssit.  falling asleep during attempts  Ambulation/Gait                 Stairs             Wheelchair Mobility    Modified Rankin (Stroke Patients Only)       Balance Overall balance assessment: Needs assistance Sitting-balance support: Feet supported Sitting balance-Leahy Scale: Fair  Cognition Arousal/Alertness: Awake/alert;Lethargic Behavior During Therapy: Flat affect Overall Cognitive Status: Impaired/Different from baseline                                 General Comments: falling asleep during tasks      Exercises Total Joint Exercises Quad Sets: AROM;Strengthening;Right;10 reps;Supine Heel Slides: AROM;Strengthening;Right;10 reps Hip ABduction/ADduction: AROM;Strengthening;Right;10 reps;Supine;AAROM (Hand under heel to decrease reistance.) Long Arc Quad: AROM;Strengthening;Right;10 reps;Seated Knee Flexion: AROM;Strengthening;Right;10 reps;Seated Goniometric ROM: 10-95 Marching in Standing: AROM;Strengthening;Both;10 reps;Standing    General Comments        Pertinent Vitals/Pain Pain Assessment: Faces Faces Pain Scale:  Hurts a little bit Pain Location: R knee Pain Descriptors / Indicators: Aching;Sore Pain Intervention(s): Limited activity within patient's tolerance;Monitored during session;Repositioned;Ice applied    Home Living                      Prior Function            PT Goals (current goals can now be found in the care plan section) Progress towards PT goals: Not progressing toward goals - comment    Frequency    BID      PT Plan Current plan remains appropriate    Co-evaluation              AM-PAC PT "6 Clicks" Mobility   Outcome Measure  Help needed turning from your back to your side while in a flat bed without using bedrails?: A Little Help needed moving from lying on your back to sitting on the side of a flat bed without using bedrails?: A Lot Help needed moving to and from a bed to a chair (including a wheelchair)?: A Lot Help needed standing up from a chair using your arms (e.g., wheelchair or bedside chair)?: A Lot Help needed to walk in hospital room?: A Lot Help needed climbing 3-5 steps with a railing? : Total 6 Click Score: 12    End of Session Equipment Utilized During Treatment: Gait belt Activity Tolerance: Patient limited by lethargy Patient left: in bed;with call bell/phone within reach;with bed alarm set Nurse Communication: Mobility status PT Visit Diagnosis: Other abnormalities of gait and mobility (R26.89);Muscle weakness (generalized) (M62.81);History of falling (Z91.81);Pain Pain - Right/Left: Right Pain - part of body: Knee     Time: 1350-1414 PT Time Calculation (min) (ACUTE ONLY): 24 min  Charges:  $Therapeutic Exercise: 8-22 mins $Therapeutic Activity: 8-22 mins                    Danielle Dess, PTA 07/04/20, 2:33 PM , 2:23 PM

## 2020-07-04 NOTE — NC FL2 (Signed)
[place] MEDICAID FL2 LEVEL OF CARE SCREENING TOOL     IDENTIFICATION  Patient Name: SHAILEY BUTTERBAUGH Birthdate: July 03, 1950 Sex: female Admission Date (Current Location): 07/01/2020  [facility] and [place] Number:      Facility and Address:  [facility], [address] [place], [place] [address]      Provider Number: [id number]  Attending Physician Name and Address:  Christena Flake, MD  Relative Name and Phone Number:  Mr Tony Friscia [phone]    Current Level of Care:   Recommended Level of Care: Skilled Nursing Facility Prior Approval Number:    Date Approved/Denied:   PASRR Number: [id number]  Discharge Plan: SNF    Current Diagnoses: Patient Active Problem List   Diagnosis Date Noted   Status post total knee replacement using cement, right 07/01/2020   Lumbar stenosis 02/04/2020   Status post total knee replacement using cement, left 07/26/2017   Gait disturbance 05/25/2016   Abnormal brain MRI 05/25/2016   Memory loss 08/28/2015   Incontinence 05/06/2015   Numbness 11/25/2014   Insomnia 08/01/2014   Abdominal pain, left upper quadrant 07/16/2014   Abnormal finding on ultrasound 07/16/2014   Fatty liver disease, nonalcoholic 07/16/2014   Small intestinal bacterial overgrowth 07/16/2014   Asthma without status asthmaticus 06/20/2014   HLD (hyperlipidemia) 06/20/2014   Dermatitis, eczematoid 06/20/2014   Acid reflux 06/20/2014   Cephalalgia 06/20/2014   BP (high blood pressure) 06/20/2014   Adiposity 06/20/2014   Arthritis, degenerative 06/20/2014   Allergic rhinitis, seasonal 06/20/2014   Proximal limb weakness 06/20/2014   Proximal leg weakness 06/20/2014   Disturbed cognition 06/20/2014   Carpal tunnel syndrome 06/20/2014   Tinnitus 06/20/2014    Orientation RESPIRATION BLADDER Height & Weight     Self, Time, Situation, Place  Normal External catheter Weight: 92.5 kg Height:  5\' 9"  (175.3 cm)  BEHAVIORAL  SYMPTOMS/MOOD NEUROLOGICAL BOWEL NUTRITION STATUS      Continent Diet (regular)  AMBULATORY STATUS COMMUNICATION OF NEEDS Skin   Extensive Assist Verbally Surgical wounds                       Personal Care Assistance Level of Assistance  Bathing, Dressing Bathing Assistance: Limited assistance   Dressing Assistance: Limited assistance     Functional Limitations Info             SPECIAL CARE FACTORS FREQUENCY  PT (By licensed PT)     PT Frequency: 5 times per week              Contractures Contractures Info: Not present    Additional Factors Info  Code Status, Allergies Code Status Info: full code Allergies Info: Pantoprazole, Codeine, Penicillin G, Sulfa Antibiotics           Current Medications (07/04/2020):  This is the current hospital active medication list Current Facility-Administered Medications  Medication Dose Route Frequency Provider Last Rate Last Admin   0.9 %  sodium chloride infusion   Intravenous Continuous Poggi, 07/06/2020, MD   Stopped at 07/02/20 0858   acetaminophen (TYLENOL) tablet 325-650 mg  325-650 mg Oral Q6H PRN 07/04/20, MD   650 mg at 07/03/20 1223   alum & mag hydroxide-simeth (MAALOX/MYLANTA) 200-200-20 MG/5ML suspension 15-30 mL  15-30 mL Oral Q4H PRN Poggi, 05-11-1974, MD       amLODipine (NORVASC) tablet 10 mg  10 mg Oral Daily Poggi, Excell Seltzer, MD   10 mg at 07/04/20 1002  apixaban (ELIQUIS) tablet 2.5 mg  2.5 mg Oral BID Poggi, Excell Seltzer, MD   2.5 mg at 07/04/20 1001   bisacodyl (DULCOLAX) suppository 10 mg  10 mg Rectal Daily PRN Poggi, Excell Seltzer, MD       carvedilol (COREG) tablet 6.25 mg  6.25 mg Oral BID WC Poggi, Excell Seltzer, MD   6.25 mg at 07/04/20 1002   cycloSPORINE (RESTASIS) 0.05 % ophthalmic emulsion 1 drop  1 drop Both Eyes BID Poggi, Excell Seltzer, MD   1 drop at 07/04/20 1003   darifenacin (ENABLEX) 24 hr tablet 7.5 mg  7.5 mg Oral Daily Poggi, Excell Seltzer, MD   7.5 mg at 07/04/20 1001   diphenhydrAMINE (BENADRYL) 12.5 MG/5ML elixir  12.5-25 mg  12.5-25 mg Oral Q4H PRN Poggi, Excell Seltzer, MD       divalproex (DEPAKOTE ER) 24 hr tablet 1,000 mg  1,000 mg Oral QPM Poggi, Excell Seltzer, MD   1,000 mg at 07/03/20 1734   docusate sodium (COLACE) capsule 100 mg  100 mg Oral BID Christena Flake, MD   100 mg at 07/04/20 1001   hydrochlorothiazide (HYDRODIURIL) tablet 25 mg  25 mg Oral Daily Lowella Bandy, RPH   25 mg at 07/04/20 1001   HYDROmorphone (DILAUDID) injection 0.25-0.5 mg  0.25-0.5 mg Intravenous Q4H PRN Poggi, Excell Seltzer, MD   0.5 mg at 07/01/20 1448   hydroxychloroquine (PLAQUENIL) tablet 200 mg  200 mg Oral BID Christena Flake, MD   200 mg at 07/04/20 1002   lactulose (CHRONULAC) 10 GM/15ML solution 20 g  20 g Oral Daily PRN Anson Oregon, PA-C       levothyroxine (SYNTHROID) tablet 50 mcg  50 mcg Oral QAC breakfast Poggi, Excell Seltzer, MD   50 mcg at 07/04/20 0533   lisinopril (ZESTRIL) tablet 20 mg  20 mg Oral Daily Lowella Bandy, RPH   20 mg at 07/04/20 1002   loratadine (CLARITIN) tablet 10 mg  10 mg Oral Daily Poggi, Excell Seltzer, MD   10 mg at 07/04/20 1002   magnesium hydroxide (MILK OF MAGNESIA) suspension 30 mL  30 mL Oral Daily PRN Christena Flake, MD   30 mL at 07/01/20 1211   metoCLOPramide (REGLAN) tablet 5-10 mg  5-10 mg Oral Q8H PRN Poggi, Excell Seltzer, MD   10 mg at 07/01/20 1211   Or   metoCLOPramide (REGLAN) injection 5-10 mg  5-10 mg Intravenous Q8H PRN Poggi, Excell Seltzer, MD       multivitamin with minerals tablet 1 tablet  1 tablet Oral Daily Poggi, Excell Seltzer, MD   1 tablet at 07/04/20 1001   omeprazole (PRILOSEC) capsule 40 mg  40 mg Oral Daily Lowella Bandy, RPH   40 mg at 07/04/20 1003   ondansetron (ZOFRAN) tablet 4 mg  4 mg Oral Q6H PRN Poggi, Excell Seltzer, MD       Or   ondansetron [facility]) injection 4 mg  4 mg Intravenous Q6H PRN Poggi, Excell Seltzer, MD   4 mg at 07/03/20 1554   oxyCODONE (Oxy IR/ROXICODONE) immediate release tablet 5-10 mg  5-10 mg Oral Q4H PRN Poggi, Excell Seltzer, MD   10 mg at 07/04/20 1002   polyvinyl alcohol (LIQUIFILM  TEARS) 1.4 % ophthalmic solution 1 drop  1 drop Both Eyes PRN Sharen Hones, RPH       pravastatin (PRAVACHOL) tablet 10 mg  10 mg Oral q1800 Poggi, Excell Seltzer, MD   10 mg at  07/03/20 1736   sertraline (ZOLOFT) tablet 100 mg  100 mg Oral Daily Poggi, Excell Seltzer, MD   100 mg at 07/04/20 1002   sodium phosphate (FLEET) 7-19 GM/118ML enema 1 enema  1 enema Rectal Once PRN Poggi, Excell Seltzer, MD       traMADol Janean Sark) tablet 50 mg  50 mg Oral Q6H PRN Poggi, Excell Seltzer, MD   50 mg at 07/03/20 0518     Discharge Medications: Please see discharge summary for a list of discharge medications.  Relevant Imaging Results:  Relevant Lab Results:   Additional Information SS# [id number]  Barrie Dunker, RN

## 2020-07-04 NOTE — TOC Progression Note (Signed)
Transition of Care [facility] - Progression Note    Patient Details  Name: Jessica Norman MRN: [id number] Date of Birth: 14-Aug-1950  Transition of [facility]) CM/SW [place], RN Phone Number: 07/04/2020, 3:15 PM  Clinical Narrative:      Met with the patient again to discuss DC plan, she is now agreeable to go to SNF PASSR obtained, FL2 complete Bedsearch sent, Bed offers need reviewed once obtained      Expected Discharge Plan and Services                                                 Social Determinants of Health (SDOH) Interventions    Readmission Risk Interventions No flowsheet data found.

## 2020-07-04 NOTE — Progress Notes (Signed)
Subjective: 3 Days Post-Op Procedure(s) (LRB): TOTAL KNEE ARTHROPLASTY (Right) Patient reports pain as mild.   Patient is well, and has had no acute complaints or problems Does report pain in her back, she is s/p lumbar spine surgery. Patient would like to return home however progress with PT has been very slow, currently recommending SNF.  Patient is open to possibly going to SNF at this time, will message care management. Negative for chest pain and shortness of breath Fever: no Gastrointestinal:Negative for nausea and vomiting, patient states that she is passing gas.  Objective: Vital signs in last 24 hours: Temp:  [98.1 F (36.7 C)-99.5 F (37.5 C)] 99.5 F (37.5 C) (06/17 0314) Pulse Rate:  [65-78] 74 (06/17 0314) Resp:  [17-18] 18 (06/17 0314) BP: (115-171)/(59-91) 125/91 (06/17 0314) SpO2:  [90 %-96 %] 96 % (06/17 0314)  Intake/Output from previous day: No intake or output data in the 24 hours ending 07/04/20 0747   Intake/Output this shift: No intake/output data recorded.  Labs: Recent Labs    07/02/20 0403 07/03/20 0343 07/04/20 0357  HGB 11.0* 10.2* 9.5*   Recent Labs    07/03/20 0343 07/04/20 0357  WBC 8.1 9.1  RBC 3.57* 3.26*  HCT 31.1* 28.4*  PLT 108* 121*   Recent Labs    07/03/20 0343 07/04/20 0357  NA 134* 132*  K 4.2 4.3  CL 99 95*  CO2 29 29  BUN 18 18  CREATININE 0.73 0.74  GLUCOSE 104* 98  CALCIUM 8.6* 8.6*   No results for input(s): LABPT, INR in the last 72 hours.  EXAM General - Patient is Alert, Appropriate, and Oriented Extremity - ABD soft Sensation intact distally Intact pulses distally Dorsiflexion/Plantar flexion intact Incision: scant drainage No cellulitis present Patient is able to perform a straight leg raise with moderate assistance Dressing/Incision - Mild bloody drainage to the honeycomb.  ACE wrap in place. Motor Function - intact, moving foot and toes well on exam.  Abdomen soft to palpation, intact bowel  sounds. Negative Homan's to bilateral lower extremities.  Past Medical History:  Diagnosis Date   Arthritis    everywhere   Asthma    Bipolar disorder (HCC)    GERD (gastroesophageal reflux disease)    Hearing loss    History of hiatal hernia 2011   treated with medication, no surgical repair   History of Sjogren's disease (HCC)    HLD (hyperlipidemia)    Hypertension    Hypothyroidism    per patient, not taking synthroid anymore   Incomplete bladder emptying    Memory loss    Obesity    Recurrent UTI    Ringing in ear, bilateral    Vision abnormalities    Assessment/Plan: 3 Days Post-Op Procedure(s) (LRB): TOTAL KNEE ARTHROPLASTY (Right) Active Problems:   Status post total knee replacement using cement, right  Estimated body mass index is 30.13 kg/m as calculated from the following:   Height as of this encounter: 5\' 9"  (1.753 m).   Weight as of this encounter: 92.5 kg. Advance diet Up with therapy   Labs reviewed this AM. Hg 9.5.  Low grade temp last night, WBC 9.1 Encouraged incentive spirometer again. Patient is passing gas but has not had a BM yet, move to FLEET enema today. Continue with therapy today.  Had a discussion with patient, currently she is not safe to return home.  Instructed her that her progress with PT will need to improve or she will need to possibly consider SNF.  Patient is open to SNF, care management to discuss today.  Will see how she performs with therapy today.  DVT Prophylaxis - Foot Pumps, TED hose, and Eliquis Weight-Bearing as tolerated to right leg  J. Horris Latino, PA-C [facility] Orthopaedic Surgery 07/04/2020, 7:47 AM

## 2020-07-04 NOTE — Progress Notes (Signed)
Physical Therapy Treatment Patient Details Name: Jessica Norman MRN: [id number] DOB: 1950-08-14 Today's Date: 07/04/2020    History of Present Illness Pt is a 70 y.o. female who presents for follow-up of her right knee pain secondary to degenerative joint disease and is s/p R TKA. PMH includes asthma, bipolar disorder, anemia, HTN, neuropathy, L TKA, cervical disc surgery, and lumbar laminectomy.    PT Comments    Pt sitting in chair upon arrival.  When greeted she stated "Why does everyone keep asking me that?"  She is aware it is 9:00 but stated it was night time.  Stated she had had 2 meals today but no supper and she is waiting for it.  Soon after dining called and she placed breakfast order and again referred back to prior conversation regarding it being night time and stated she had breakfast/lunch and was awaiting for dinner.  Oriented again.  She stated "I'm confused."  +2 mod assist to stand to walker.  Processing remains slow.  She is able to hesitantly step with heavy reliance on RW and very small steps.  They do improve some with time but by the time she reaches the door gait remains quite slow and quality decreases and she is encouraged to sit.  Seated AROM and stretching.  Pt with some improvement in mobility today but overall remains with need for significant assist for mobility and time to process.  Confusion noted today in regards to time of day despite bright sunshine in room.    Pt did state she wears a back brace at home and reports some hip discomfort from sitting in chair "all day" yesterday.  She was only up for a few hours in the morning.  She was calling husband when I left to ask him to bring in back brace she wears at home for comfort.   Follow Up Recommendations  SNF     Equipment Recommendations  None recommended by PT    Recommendations for Other Services       Precautions / Restrictions Precautions Precautions: Fall Restrictions Weight Bearing  Restrictions: Yes RLE Weight Bearing: Weight bearing as tolerated LLE Weight Bearing: Weight bearing as tolerated    Mobility  Bed Mobility               General bed mobility comments: in recliner upon arrival with nursing staff    Transfers Overall transfer level: Needs assistance Equipment used: Rolling walker (2 wheeled) Transfers: Sit to/from Stand Sit to Stand: Mod assist;+2 physical assistance            Ambulation/Gait Ambulation/Gait assistance: Min assist Gait Distance (Feet): 12 Feet Assistive device: Rolling walker (2 wheeled) Gait Pattern/deviations: Step-to pattern;Decreased step length - left;Decreased stance time - right Gait velocity: decreased   General Gait Details: cues for walker position and increasing step length   Stairs             Wheelchair Mobility    Modified Rankin (Stroke Patients Only)       Balance Overall balance assessment: Needs assistance Sitting-balance support: Feet supported Sitting balance-Leahy Scale: Fair     Standing balance support: Bilateral upper extremity supported Standing balance-Leahy Scale: Poor Standing balance comment: heavy reliance on walker and hands on assist at all times, balance decreased with fatigue                            Cognition Arousal/Alertness: Awake/alert Behavior During Therapy: Flat affect  Overall Cognitive Status: Impaired/Different from baseline                                 General Comments: confused to time of day      Exercises Total Joint Exercises Goniometric ROM: 15-95 Other Exercises Other Exercises: seated AROM and stretching    General Comments        Pertinent Vitals/Pain Pain Assessment: Faces Faces Pain Scale: Hurts little more Pain Location: R knee Pain Descriptors / Indicators: Aching;Sore Pain Intervention(s): Limited activity within patient's tolerance;Monitored during session;Repositioned;Ice applied    Home  Living                      Prior Function            PT Goals (current goals can now be found in the care plan section) Progress towards PT goals: Progressing toward goals    Frequency    BID      PT Plan Current plan remains appropriate    Co-evaluation              AM-PAC PT "6 Clicks" Mobility   Outcome Measure  Help needed turning from your back to your side while in a flat bed without using bedrails?: A Little Help needed moving from lying on your back to sitting on the side of a flat bed without using bedrails?: A Lot Help needed moving to and from a bed to a chair (including a wheelchair)?: A Lot Help needed standing up from a chair using your arms (e.g., wheelchair or bedside chair)?: A Lot Help needed to walk in hospital room?: A Lot Help needed climbing 3-5 steps with a railing? : Total 6 Click Score: 12    End of Session Equipment Utilized During Treatment: Gait belt Activity Tolerance: Patient tolerated treatment well Patient left: in chair;with call bell/phone within reach;with chair alarm set Nurse Communication: Mobility status;Other (comment) PT Visit Diagnosis: Other abnormalities of gait and mobility (R26.89);Muscle weakness (generalized) (M62.81);History of falling (Z91.81);Pain Pain - Right/Left: Right Pain - part of body: Knee     Time: 4008-6761 PT Time Calculation (min) (ACUTE ONLY): 18 min  Charges:  $Gait Training: 8-22 mins                    Danielle Dess, PTA 07/04/20, 9:24 AM , 9:17 AM

## 2020-07-05 NOTE — Progress Notes (Signed)
Physical Therapy Treatment Patient Details Name: Jessica Norman MRN: [id number] DOB: 10-Apr-1950 Today's Date: 07/05/2020    History of Present Illness Pt is a 70 y.o. female who presents for follow-up of her right knee pain secondary to degenerative joint disease and is s/p R TKA. PMH includes asthma, bipolar disorder, anemia, HTN, neuropathy, L TKA, cervical disc surgery, and lumbar laminectomy.    PT Comments    Pt was pleasant and motivated to participate during the session. Pt gave great effort throughout session. Pt required min physical assistance for mobility during this session but still required min-mod verbal cueing for hand and R LE placement, walker placement, and sequencing during ambulation and transfers. Pt is making progress towards goals. Pt will benefit from PT services in a SNF setting upon discharge to safely address deficits listed in patient problem list for decreased caregiver assistance and eventual return to PLOF.     Follow Up Recommendations  SNF     Equipment Recommendations  None recommended by PT    Recommendations for Other Services       Precautions / Restrictions Precautions Precautions: Fall Restrictions Weight Bearing Restrictions: Yes RLE Weight Bearing: Weight bearing as tolerated    Mobility  Bed Mobility     General bed mobility comments: NT, pt in recliner chair upon arrival    Transfers Overall transfer level: Needs assistance Equipment used: Rolling walker (2 wheeled) Transfers: Sit to/from Stand Sit to Stand: Min assist         General transfer comment: Verbal cueing for hand, RLE placement, and scooting to edge of chair.  Ambulation/Gait Ambulation/Gait assistance: Min assist Gait Distance (Feet): 20 Feet x1, 2 feet x 1 Assistive device: Rolling walker (2 wheeled) Gait Pattern/deviations: Step-to pattern;Step-through pattern;Decreased step length - left;Decreased stance time - right Gait velocity: decreased    General Gait Details: Verbal cues for walker position, increasing step length, and turning sequence. Pt demonstrated step- through gait pattern intermittently during ambulation but still a slow cadence.   Stairs             Wheelchair Mobility    Modified Rankin (Stroke Patients Only)       Balance Overall balance assessment: Needs assistance Sitting-balance support: Feet supported Sitting balance-Leahy Scale: Fair     Standing balance support: Bilateral upper extremity supported Standing balance-Leahy Scale: Poor Standing balance comment: heavy reliance on walker and hands on assist at all times                            Cognition Arousal/Alertness: Awake/alert Behavior During Therapy: WFL for tasks assessed/performed Overall Cognitive Status: Within Functional Limits for tasks assessed                                        Exercises Total Joint Exercises Ankle Circles/Pumps: AROM;Both;10 reps;Supine Quad Sets: AROM;Strengthening;Right;10 reps;Supine Straight Leg Raises: AAROM;Strengthening;Right;10 reps Long Arc Quad: AROM;Strengthening;Right;10 reps;Seated Knee Flexion: AROM;Strengthening;Right;10 reps;Seated Goniometric ROM: 20-76 Marching in Standing: AROM;Strengthening;Both;10 reps;Standing    General Comments        Pertinent Vitals/Pain Pain Assessment: 0-10 Pain Score: 0-No pain Pain Location: R knee Pain Descriptors / Indicators: Aching;Sore Pain Intervention(s): Monitored during session    Home Living                      Prior Function  PT Goals (current goals can now be found in the care plan section) Progress towards PT goals: Progressing toward goals    Frequency    BID      PT Plan Current plan remains appropriate    Co-evaluation              AM-PAC PT "6 Clicks" Mobility   Outcome Measure  Help needed turning from your back to your side while in a flat bed without  using bedrails?: A Little Help needed moving from lying on your back to sitting on the side of a flat bed without using bedrails?: A Lot Help needed moving to and from a bed to a chair (including a wheelchair)?: A Lot Help needed standing up from a chair using your arms (e.g., wheelchair or bedside chair)?: A Little Help needed to walk in hospital room?: A Little Help needed climbing 3-5 steps with a railing? : A Lot 6 Click Score: 15    End of Session Equipment Utilized During Treatment: Gait belt Activity Tolerance: Patient tolerated treatment well Patient left: Other (comment);with call bell/phone within reach (Pt left on BSC next to bed and nurse was notified.) Nurse Communication: Mobility status PT Visit Diagnosis: Other abnormalities of gait and mobility (R26.89);Muscle weakness (generalized) (M62.81);History of falling (Z91.81);Pain Pain - Right/Left: Right Pain - part of body: Knee     Time: 1300-1325 PT Time Calculation (min) (ACUTE ONLY): 25 min  Charges:  $Gait Training: 8-22 mins $Therapeutic Exercise: 8-22 mins $Therapeutic Activity: 8-22 mins                     Desiree Hane SPT 07/05/20, 4:43 PM

## 2020-07-05 NOTE — Progress Notes (Signed)
  Subjective: 4 Days Post-Op Procedure(s) (LRB): TOTAL KNEE ARTHROPLASTY (Right) Patient reports pain as well-controlled.   Patient is well, and has had no acute complaints or problems. Er daughter is at bedside, and per daughter patient's mentation is much improved today Plan is to go Skilled nursing facility after hospital stay. Negative for chest pain and shortness of breath Fever: no Gastrointestinal: negative for nausea and vomiting.    Objective: Vital signs in last 24 hours: Temp:  [98.3 F (36.8 C)-99.1 F (37.3 C)] 98.3 F (36.8 C) (06/18 0740) Pulse Rate:  [70-77] 75 (06/18 0740) Resp:  [16-21] 17 (06/18 0740) BP: (139-165)/(60-68) 152/68 (06/18 0740) SpO2:  [90 %-98 %] 97 % (06/18 0740)  Intake/Output from previous day:  Intake/Output Summary (Last 24 hours) at 07/05/2020 1106 Last data filed at 07/05/2020 0655 Gross per 24 hour  Intake --  Output 650 ml  Net -650 ml    Intake/Output this shift: No intake/output data recorded.  Labs: Recent Labs    07/03/20 0343 07/04/20 0357  HGB 10.2* 9.5*   Recent Labs    07/03/20 0343 07/04/20 0357  WBC 8.1 9.1  RBC 3.57* 3.26*  HCT 31.1* 28.4*  PLT 108* 121*   Recent Labs    07/03/20 0343 07/04/20 0357  NA 134* 132*  K 4.2 4.3  CL 99 95*  CO2 29 29  BUN 18 18  CREATININE 0.73 0.74  GLUCOSE 104* 98  CALCIUM 8.6* 8.6*   No results for input(s): LABPT, INR in the last 72 hours.   EXAM General - Patient is Alert, Appropriate, and Oriented Extremity - Neurovascular intact Dorsiflexion/Plantar flexion intact Compartment soft Dressing/Incision -clean, minimal dried blood noted, Polar Care in place and working  Motor Function - intact, moving foot and toes well on exam. Able to perform SLR with assistance.   Cardiovascular-  regular rate and rhythm, systolic murmur noted Respiratory- Lungs clear to auscultation bilaterally Gastrointestinal- soft, nontender, and active bowel  sounds   Assessment/Plan: 4 Days Post-Op Procedure(s) (LRB): TOTAL KNEE ARTHROPLASTY (Right) Active Problems:   Status post total knee replacement using cement, right  Estimated body mass index is 30.13 kg/m as calculated from the following:   Height as of this encounter: 5\' 9"  (1.753 m).   Weight as of this encounter: 92.5 kg. Advance diet Up with therapy Discharge to SNF pending insurance approval, likely Monday    Will plan to change honeycomb tomorrow.    DVT Prophylaxis - Eliquis, Ted hose, and foot pumps Weight-Bearing as tolerated to right leg  Monday, PA-C [facility] Orthopaedic Surgery 07/05/2020, 11:06 AM

## 2020-07-05 NOTE — Progress Notes (Signed)
Physical Therapy Treatment Patient Details Name: Jessica Norman MRN: [id number] DOB: [birth year] Today's Date: 07/05/2020    History of Present Illness Pt is a 70 y.o. female who presents for follow-up of her right knee pain secondary to degenerative joint disease and is s/p R TKA. PMH includes asthma, bipolar disorder, anemia, HTN, neuropathy, L TKA, cervical disc surgery, and lumbar laminectomy.    PT Comments    Pt was pleasant and with encouragement is motivated to participate during the session. Pt performed better than previous session but overall still very limited with functional mobility secondary to pain and generalized weakness.Pt still requires significant physical assistance with functional mobility. Pt ambulates very slowly with slow cadence and short step length. Pt froze while turning to sit in recliner requiring mod verbal and tactile cueing for turning with walker, sequencing and hand placement. Pt will benefit from PT services in a SNF setting upon discharge to safely address deficits listed in patient problem list for decreased caregiver assistance and eventual return to PLOF.   Follow Up Recommendations  SNF     Equipment Recommendations  None recommended by PT    Recommendations for Other Services       Precautions / Restrictions Precautions Precautions: Fall Restrictions Weight Bearing Restrictions: Yes RLE Weight Bearing: Weight bearing as tolerated    Mobility  Bed Mobility Overal bed mobility: Needs Assistance Bed Mobility: Supine to Sit     Supine to sit: Mod assist     General bed mobility comments: Pt used rails and needed increased time to complete. Min verbal cues for sequencing to get BLEs off of bed and mod assist for trunk control.    Transfers Overall transfer level: Needs assistance Equipment used: Rolling walker (2 wheeled) Transfers: Sit to/from Stand Sit to Stand: Min assist;+2 physical assistance         General transfer  comment: Verbal cueing for hand and RLE placement.  Ambulation/Gait Ambulation/Gait assistance: Min assist Gait Distance (Feet): 20 Feet Assistive device: Rolling walker (2 wheeled) Gait Pattern/deviations: Step-to pattern;Decreased step length - left;Decreased stance time - right Gait velocity: decreased   General Gait Details: Verbal cues for walker position and extra cueing for turning with walker to keep hands on walker and sequencing.   Stairs             Wheelchair Mobility    Modified Rankin (Stroke Patients Only)       Balance Overall balance assessment: Needs assistance Sitting-balance support: Feet supported Sitting balance-Leahy Scale: Fair     Standing balance support: Bilateral upper extremity supported Standing balance-Leahy Scale: Poor Standing balance comment: heavy reliance on walker and hands on assist at all times, balance decreased with fatigue                            Cognition Arousal/Alertness: Awake/alert Behavior During Therapy: WFL for tasks assessed/performed Overall Cognitive Status: Within Functional Limits for tasks assessed                                        Exercises Total Joint Exercises Ankle Circles/Pumps: AROM;Both;10 reps;Supine Quad Sets: AROM;Strengthening;Right;10 reps;Supine Straight Leg Raises: AAROM;Strengthening;Right;10 reps Long Arc Quad: AROM;Strengthening;Right;10 reps;Seated Knee Flexion: AROM;Strengthening;Right;10 reps;Seated Goniometric ROM: AROM R knee 20-76 Marching in Standing: AROM;Strengthening;Both;10 reps;Standing    General Comments        Pertinent Vitals/Pain  Pain Assessment: 0-10 Pain Score: 3  Pain Location: R knee Pain Descriptors / Indicators: Aching;Sore Pain Intervention(s): Limited activity within patient's tolerance;Monitored during session;Repositioned;Ice applied    Home Living                      Prior Function            PT Goals  (current goals can now be found in the care plan section) Progress towards PT goals: Not progressing toward goals - comment; Patient is limited by pain.     Frequency    BID      PT Plan Current plan remains appropriate    Co-evaluation              AM-PAC PT "6 Clicks" Mobility   Outcome Measure  Help needed turning from your back to your side while in a flat bed without using bedrails?: A Little Help needed moving from lying on your back to sitting on the side of a flat bed without using bedrails?: A Lot Help needed moving to and from a bed to a chair (including a wheelchair)?: A Lot Help needed standing up from a chair using your arms (e.g., wheelchair or bedside chair)?: A Lot Help needed to walk in hospital room?: A Little Help needed climbing 3-5 steps with a railing? : Total 6 Click Score: 13    End of Session Equipment Utilized During Treatment: Gait belt Activity Tolerance: Patient limited by fatigue Patient left: in chair;with call bell/phone within reach;with chair alarm set;with family/visitor present;with SCD's reapplied;Other (comment) (Polar care applied to RLE) Nurse Communication: Mobility status PT Visit Diagnosis: Other abnormalities of gait and mobility (R26.89);Muscle weakness (generalized) (M62.81);History of falling (Z91.81);Pain Pain - Right/Left: Right Pain - part of body: Knee     Time: 5093-2671 PT Time Calculation (min) (ACUTE ONLY): 48 min  Charges:                        Desiree Hane SPT 07/05/20, 12:52 PM

## 2020-07-05 NOTE — TOC Progression Note (Signed)
Transition of Care [facility]) - Progression Note    Patient Details  Name: Jessica Norman MRN: [id number] Date of Birth: 1950-09-08  Transition of [facility]) CM/SW Contact  Luvenia Redden, RN Phone Number: [phone] 07/05/2020, 2:18 PM  Clinical Narrative:     RN attempted contact with pt and her spouse Jessica Norman) concerning bed offers for SNF placement however unsuccessful. RN only able to leave a HIPAA approved voice message requesting a call back to confirm acceptance to one of the two facilities.  Facilities Acceptance: Pharmacist, hospital at [place]   Will follow up once again on tomorrow if no call back received today.       Expected Discharge Plan and Services                                                 Social Determinants of Health (SDOH) Interventions    Readmission Risk Interventions No flowsheet data found.

## 2020-07-06 NOTE — Progress Notes (Signed)
Physical Therapy Treatment Patient Details Name: Jessica Norman MRN: [id number] DOB: [birth year] Today's Date: 07/06/2020    History of Present Illness Pt is a 70 y.o. female who presents for follow-up of her right knee pain secondary to degenerative joint disease and is s/p R TKA. PMH includes asthma, bipolar disorder, anemia, HTN, neuropathy, L TKA, cervical disc surgery, and lumbar laminectomy.    PT Comments    Pt was pleasant and motivated to participate during the session. Pt gave great effort throughout session. Pt required min physical assistance for mobility during this session and still required min-mod verbal cueing for hand and R LE placement, walker placement, and sequencing during ambulation and transfers. Pt ambulates very slowly with significant difficulty progressing to and maintaining step-through gait pattern. Pt is making progress towards goals. Pt will benefit from PT services in a SNF setting upon discharge to safely address deficits listed in patient problem list for decreased caregiver assistance and eventual return to PLOF.     Follow Up Recommendations  SNF     Equipment Recommendations  None recommended by PT    Recommendations for Other Services       Precautions / Restrictions Precautions Precautions: Fall Restrictions Weight Bearing Restrictions: Yes RLE Weight Bearing: Weight bearing as tolerated    Mobility  Bed Mobility Overal bed mobility: Needs Assistance Bed Mobility: Sit to Supine       Sit to supine: Mod assist (assist for Bilateral LEs)        Transfers Overall transfer level: Needs assistance Equipment used: Rolling walker (2 wheeled) Transfers: Sit to/from Stand Sit to Stand: Min guard         General transfer comment: Verbal cueing for hand and RLE placement  Ambulation/Gait Ambulation/Gait assistance: Min assist Gait Distance (Feet): 20 Feet Assistive device: Rolling walker (2 wheeled) Gait Pattern/deviations:  Step-to pattern;Decreased step length - left;Decreased stance time - right Gait velocity: decreased   General Gait Details: Verbal cues for walker position and increasing step length. Pt had difficulty progressing to step-through.   Stairs             Wheelchair Mobility    Modified Rankin (Stroke Patients Only)       Balance Overall balance assessment: Needs assistance Sitting-balance support: Feet supported Sitting balance-Leahy Scale: Good     Standing balance support: Bilateral upper extremity supported Standing balance-Leahy Scale: Poor Standing balance comment: heavy reliance on walker                            Cognition Arousal/Alertness: Awake/alert Behavior During Therapy: WFL for tasks assessed/performed Overall Cognitive Status: Within Functional Limits for tasks assessed                                        Exercises Total Joint Exercises Quad Sets: AROM;Strengthening;Right;10 reps;Seated;Other (comment) (reclined) Hip ABduction/ADduction: AAROM;Strengthening;Right;Supine;Other (comment);10 reps (hands under heel to decrease resistance) Straight Leg Raises: AAROM;Strengthening;Right;10 reps Knee Flexion: AROM;Strengthening;Right;Seated;5 reps Goniometric ROM: R knee AROM 16-81    General Comments        Pertinent Vitals/Pain Pain Assessment: 0-10 Pain Score: 7  Pain Location: R knee Pain Descriptors / Indicators: Aching;Sore Pain Intervention(s): Monitored during session;Repositioned;Patient requesting pain meds-RN notified    Home Living  Prior Function            PT Goals (current goals can now be found in the care plan section) Progress towards PT goals: Progressing toward goals    Frequency    BID      PT Plan Current plan remains appropriate    Co-evaluation              AM-PAC PT "6 Clicks" Mobility   Outcome Measure  Help needed turning from your back  to your side while in a flat bed without using bedrails?: A Little Help needed moving from lying on your back to sitting on the side of a flat bed without using bedrails?: A Lot Help needed moving to and from a bed to a chair (including a wheelchair)?: A Lot Help needed standing up from a chair using your arms (e.g., wheelchair or bedside chair)?: A Little Help needed to walk in hospital room?: A Little Help needed climbing 3-5 steps with a railing? : A Lot 6 Click Score: 15    End of Session Equipment Utilized During Treatment: Gait belt Activity Tolerance: Patient tolerated treatment well Patient left: in bed;with call bell/phone within reach;with bed alarm set;with family/visitor present;with SCD's reapplied;Other (comment) (Polar care applied to R LE) Nurse Communication: Mobility status;Patient requests pain meds PT Visit Diagnosis: Other abnormalities of gait and mobility (R26.89);Muscle weakness (generalized) (M62.81);History of falling (Z91.81);Pain Pain - Right/Left: Right Pain - part of body: Knee     Time: 5277-8242 PT Time Calculation (min) (ACUTE ONLY): 41 min  Charges:                        Desiree Hane SPT 07/06/20, 2:03 PM

## 2020-07-06 NOTE — TOC Progression Note (Signed)
Transition of Care [facility]) - Progression Note    Patient Details  Name: Jessica Norman MRN: [id number] Date of Birth: 27-Oct-1950  Transition of Care [facility]) CM/SW Contact  Ashley Royalty Lutricia Feil, RN Phone Number:[phone] 07/06/2020, 10:54 AM  Clinical Narrative:    RN spoke with pt's spouse [facility]) here at the hospital for available bed offers. Spouse has decided on [facility].  RN spoke with [facility] Archie Patten) accepting the offer for possible discharge on Monday.   TOC will continue to follow for discharge disposition.       Expected Discharge Plan and Services                                                 Social Determinants of Health (SDOH) Interventions    Readmission Risk Interventions No flowsheet data found.

## 2020-07-06 NOTE — Progress Notes (Signed)
  Subjective: 5 Days Post-Op Procedure(s) (LRB): TOTAL KNEE ARTHROPLASTY (Right) Patient reports pain as well-controlled.  Patient's husband is at bedside. Patient is well, and has had no acute complaints or problems Plan is to go Skilled nursing facility after hospital stay. Negative for chest pain and shortness of breath Fever: no Gastrointestinal: negative for nausea and vomiting.  Patient has had a bowel movement.  Objective: Vital signs in last 24 hours: Temp:  [97.9 F (36.6 C)-99.3 F (37.4 C)] 97.9 F (36.6 C) (06/19 0827) Pulse Rate:  [68-73] 70 (06/19 0827) Resp:  [14-18] 16 (06/19 0827) BP: (131-163)/(49-75) 163/63 (06/19 0827) SpO2:  [96 %-100 %] 96 % (06/19 0827)  Intake/Output from previous day:  Intake/Output Summary (Last 24 hours) at 07/06/2020 1223 Last data filed at 07/06/2020 1025 Gross per 24 hour  Intake 120 ml  Output 850 ml  Net -730 ml    Intake/Output this shift: Total I/O In: 120 [P.O.:120] Out: -   Labs: Recent Labs    07/04/20 0357  HGB 9.5*   Recent Labs    07/04/20 0357  WBC 9.1  RBC 3.26*  HCT 28.4*  PLT 121*   Recent Labs    07/04/20 0357  NA 132*  K 4.3  CL 95*  CO2 29  BUN 18  CREATININE 0.74  GLUCOSE 98  CALCIUM 8.6*   No results for input(s): LABPT, INR in the last 72 hours.   EXAM General - Patient is Alert, Appropriate, and Oriented Extremity - Neurovascular intact Dorsiflexion/Plantar flexion intact Compartment soft, Polar Care in place  Dressing/Incision -dry, mild dried blood noted on honeycomb Motor Function - intact, moving foot and toes well on exam.    Cardiovascular- Regular rate and rhythm, no murmurs/rubs/gallops Respiratory- Lungs clear to auscultation bilaterally Gastrointestinal- soft, nontender, and active bowel sounds   Assessment/Plan: 5 Days Post-Op Procedure(s) (LRB): TOTAL KNEE ARTHROPLASTY (Right) Active Problems:   Status post total knee replacement using cement,  right  Estimated body mass index is 30.13 kg/m as calculated from the following:   Height as of this encounter: 5\' 9"  (1.753 m).   Weight as of this encounter: 92.5 kg. Advance diet Up with therapy Plan for discharge tomorrow pending insurance authorization  Fresh honeycomb dressing placed. Order placed for Covid test.   DVT Prophylaxis - Eliquis, Ted hose, and foot pumps Weight-Bearing as tolerated to right leg  , PA-C [facility] Orthopaedic Surgery 07/06/2020, 12:23 PM

## 2020-07-07 LAB — SARS CORONAVIRUS 2 (TAT 6-24 HRS): SARS Coronavirus 2: NEGATIVE

## 2020-07-07 MED ORDER — ACETAMINOPHEN 500 MG PO TABS: 500.0000 mg | ORAL_TABLET | Freq: Four times a day (QID) | ORAL | 0 refills | Status: DC | PRN

## 2020-07-07 MED ORDER — TRAMADOL HCL 50 MG PO TABS: 50.0000 mg | ORAL_TABLET | Freq: Four times a day (QID) | ORAL | 0 refills | Status: DC | PRN

## 2020-07-07 NOTE — Care Management Important Message (Signed)
Important Message  Patient Details  Name: Jessica Norman MRN: [id number] Date of Birth: 08/10/1950   Medicare Important Message Given:  Yes  Patient is in an isolation room so I called her room [phone]) and reviewed her Important Message from Medicare with her again.  She signed a copy on Friday, 6/17 and I asked if she would like another copy and he replied no as she still had the one from Friday.  I wished her well and thanked her for her time.  Olegario Messier A Areebah Meinders 07/07/2020, 11:25 AM

## 2020-07-07 NOTE — TOC Progression Note (Signed)
Transition of Care [facility]) - Progression Note    Patient Details  Name: Jessica Norman MRN: [id number] Date of Birth: 08/25/1950  Transition of Care [facility]) CM/SW Contact  Barrie Dunker, RN Phone Number: 07/07/2020, 2:49 PM  Clinical Narrative:      Spoke with the patient and called the husband the patient wil be picked up to go to room 23 A at [facility] at 4 PM by [facility]      Expected Discharge Plan and Services           Expected Discharge Date: 07/07/20                                     Social Determinants of Health (SDOH) Interventions    Readmission Risk Interventions No flowsheet data found.

## 2020-07-07 NOTE — Progress Notes (Signed)
Report given to [employer] at [facility]. Patient awaiting transportation from [facility].

## 2020-07-07 NOTE — Progress Notes (Signed)
  Subjective: 6 Days Post-Op Procedure(s) (LRB): TOTAL KNEE ARTHROPLASTY (Right) Patient reports pain as well-controlled. Patient is well, and has had no acute complaints or problems Plan is to go Skilled nursing facility after hospital stay. Negative for chest pain and shortness of breath Fever: no Gastrointestinal: negative for nausea and vomiting.  Patient has had a bowel movement.  Objective: Vital signs in last 24 hours: Temp:  [97.8 F (36.6 C)-98.4 F (36.9 C)] 98.1 F (36.7 C) (06/20 0413) Pulse Rate:  [65-70] 67 (06/20 0413) Resp:  [16-18] 18 (06/20 0413) BP: (119-163)/(63-76) 145/68 (06/20 0413) SpO2:  [96 %-100 %] 99 % (06/20 0413)  Intake/Output from previous day:  Intake/Output Summary (Last 24 hours) at 07/07/2020 0755 Last data filed at 07/07/2020 0424 Gross per 24 hour  Intake 240 ml  Output 300 ml  Net -60 ml    Intake/Output this shift: No intake/output data recorded.  Labs: No results for input(s): HGB in the last 72 hours.  No results for input(s): WBC, RBC, HCT, PLT in the last 72 hours.  No results for input(s): NA, K, CL, CO2, BUN, CREATININE, GLUCOSE, CALCIUM in the last 72 hours.  No results for input(s): LABPT, INR in the last 72 hours.   EXAM General - Patient is Alert, Appropriate, and Oriented Extremity - Neurovascular intact Dorsiflexion/Plantar flexion intact Compartment soft, Polar Care in place  TED hose intact to the right leg. Dressing/Incision -No drainage noted to the fresh honeycomb applied yesterday. Motor Function - intact, moving foot and toes well on exam.    Cardiovascular- Regular rate and rhythm, no murmurs/rubs/gallops Respiratory- Lungs clear to auscultation bilaterally Gastrointestinal- soft, nontender, and active bowel sounds  Assessment/Plan: 6 Days Post-Op Procedure(s) (LRB): TOTAL KNEE ARTHROPLASTY (Right) Active Problems:   Status post total knee replacement using cement, right  Estimated body mass index  is 30.13 kg/m as calculated from the following:   Height as of this encounter: 5\' 9"  (1.753 m).   Weight as of this encounter: 92.5 kg.  Up with therapy today. Patient has had a BM. Plan for discharge to SNF today. COVID test negative yesterday.  DVT Prophylaxis - Eliquis, Ted hose, and foot pumps Weight-Bearing as tolerated to right leg  J. , PA-C [facility] Orthopaedic Surgery 07/07/2020, 7:55 AM

## 2020-07-07 NOTE — Progress Notes (Signed)
Physical Therapy Treatment Patient Details Name: Jessica Norman MRN: [id number] DOB: November 09, 1950 Today's Date: 07/07/2020    History of Present Illness Pt is a 70 y.o. female who presents for follow-up of her right knee pain secondary to degenerative joint disease and is s/p R TKA. PMH includes asthma, bipolar disorder, anemia, HTN, neuropathy, L TKA, cervical disc surgery, and lumbar laminectomy.    PT Comments    Pt was pleasant and motivated to participate during the session. Pt gave great effort throughout session. Pt is making good progress towards goals but still limited by fatigue and generalized weakness for functional mobility. Pt required physical assistance performing sit<>stand from non-elevated surfaces without arm rest as well as verbal cues for hand and RLE placement. Pt ambulated faster than past visits but still has a short left step length,  difficulty maintaining step-through gait pattern, and limited endurance. Pt tends to step outside of walker while turning requiring verbal cueing to stay within walker for patient safety. Pt will benefit from PT services in a SNF setting upon discharge to safely address deficits listed in patient problem list for decreased caregiver assistance and eventual return to PLOF.   Follow Up Recommendations  SNF     Equipment Recommendations  None recommended by PT    Recommendations for Other Services       Precautions / Restrictions Precautions Precautions: Fall Restrictions Weight Bearing Restrictions: Yes RLE Weight Bearing: Weight bearing as tolerated    Mobility  Bed Mobility               General bed mobility comments: NT, pt in recliner chair upon arrival    Transfers Overall transfer level: Needs assistance Equipment used: Rolling walker (2 wheeled) Transfers: Sit to/from Stand Sit to Stand: Supervision;Mod assist         General transfer comment: Verbal cueing for hand and RLE placement. Mod assist when  standing from surface with no arm rests and non-elevated surface.  Ambulation/Gait Ambulation/Gait assistance: Min guard Gait Distance (Feet): 50 Feet Assistive device: Rolling walker (2 wheeled) Gait Pattern/deviations: Step-to pattern;Decreased step length - left;Decreased stance time - right;Step-through pattern Gait velocity: decreased   General Gait Details: Verbal cues for increasing step length and walker position especially during turns. Pt demonstrated step through pattern intermittently.   Stairs Stairs: Yes Stairs assistance: Min assist Stair Management: Two rails;Step to pattern;Forwards;Backwards Number of Stairs: 1 General stair comments: Stair navigation education provided verbally and via demonstration to pt and husband. Verbal cues for sequencing. Pt ascended forward and descended backward. Pt limited to 1 step secondary to fatigue.   Wheelchair Mobility    Modified Rankin (Stroke Patients Only)       Balance Overall balance assessment: Needs assistance Sitting-balance support: Feet supported Sitting balance-Leahy Scale: Good     Standing balance support: Bilateral upper extremity supported Standing balance-Leahy Scale: Fair Standing balance comment: heavy reliance on walker                            Cognition Arousal/Alertness: Awake/alert Behavior During Therapy: WFL for tasks assessed/performed Overall Cognitive Status: Within Functional Limits for tasks assessed                                        Exercises Total Joint Exercises Quad Sets: AROM;Strengthening;Right;10 reps;Seated;Other (comment) (reclined) Long Arc Quad: AROM;Strengthening;Right;10 reps;Seated Knee  Flexion: AROM;Strengthening;Right;Seated;10 reps Goniometric ROM: R knee AROM 17-87 Other Exercises Other Exercises: Transfer education provided to pt's husband    General Comments        Pertinent Vitals/Pain Pain Score: 7  Pain Location: R  knee Pain Descriptors / Indicators: Aching;Sore Pain Intervention(s): Monitored during session;Ice applied    Home Living                      Prior Function            PT Goals (current goals can now be found in the care plan section) Progress towards PT goals: Progressing toward goals    Frequency    BID      PT Plan Current plan remains appropriate    Co-evaluation              AM-PAC PT "6 Clicks" Mobility   Outcome Measure  Help needed turning from your back to your side while in a flat bed without using bedrails?: A Little Help needed moving from lying on your back to sitting on the side of a flat bed without using bedrails?: A Lot Help needed moving to and from a bed to a chair (including a wheelchair)?: A Lot Help needed standing up from a chair using your arms (e.g., wheelchair or bedside chair)?: A Little Help needed to walk in hospital room?: A Little Help needed climbing 3-5 steps with a railing? : A Lot 6 Click Score: 15    End of Session Equipment Utilized During Treatment: Gait belt Activity Tolerance: Patient tolerated treatment well Patient left: in chair;with call bell/phone within reach;with chair alarm set;with family/visitor present;with SCD's reapplied;Other (comment) (Polar care applied to RLE) Nurse Communication: Mobility status PT Visit Diagnosis: Other abnormalities of gait and mobility (R26.89);Muscle weakness (generalized) (M62.81);History of falling (Z91.81);Pain Pain - Right/Left: Right Pain - part of body: Knee     Time: 1004-1057 PT Time Calculation (min) (ACUTE ONLY): 53 min  Charges:                        Desiree Hane SPT 07/07/20, 1:35 PM

## 2020-08-19 ENCOUNTER — Other Ambulatory Visit: Payer: Self-pay | Admitting: Neurosurgery

## 2020-08-19 DIAGNOSIS — Z9889 Other specified postprocedural states: Secondary | ICD-10-CM

## 2020-08-19 DIAGNOSIS — M5136 Other intervertebral disc degeneration, lumbar region: Secondary | ICD-10-CM

## 2020-08-22 ENCOUNTER — Other Ambulatory Visit (HOSPITAL_COMMUNITY): Payer: Self-pay | Admitting: Neurosurgery

## 2020-08-22 ENCOUNTER — Other Ambulatory Visit: Payer: Self-pay | Admitting: Neurosurgery

## 2020-08-22 DIAGNOSIS — M5136 Other intervertebral disc degeneration, lumbar region: Secondary | ICD-10-CM

## 2020-08-22 DIAGNOSIS — M5416 Radiculopathy, lumbar region: Secondary | ICD-10-CM

## 2020-08-22 DIAGNOSIS — Z9889 Other specified postprocedural states: Secondary | ICD-10-CM

## 2020-08-31 ENCOUNTER — Ambulatory Visit
Admission: RE | Admit: 2020-08-31 | Discharge: 2020-08-31 | Disposition: A | Discharge status: Home / Self Care | Payer: Medicare Other | Source: Ambulatory Visit | Attending: Neurosurgery | Admitting: Neurosurgery

## 2020-08-31 ENCOUNTER — Other Ambulatory Visit: Payer: Self-pay

## 2020-08-31 DIAGNOSIS — M5136 Other intervertebral disc degeneration, lumbar region: Secondary | ICD-10-CM | POA: Insufficient documentation

## 2020-08-31 DIAGNOSIS — M5416 Radiculopathy, lumbar region: Secondary | ICD-10-CM | POA: Insufficient documentation

## 2020-08-31 DIAGNOSIS — Z9889 Other specified postprocedural states: Secondary | ICD-10-CM | POA: Diagnosis present

## 2020-08-31 MED ORDER — GADOBUTROL 1 MMOL/ML IV SOLN
9.0000 mL | Freq: Once | INTRAVENOUS | Status: AC | PRN
  Administered 2020-08-31: 10 mL via INTRAVENOUS

## 2021-03-19 ENCOUNTER — Other Ambulatory Visit: Payer: Self-pay

## 2021-03-19 ENCOUNTER — Emergency Department
Admission: EM | Admit: 2021-03-19 | Discharge: 2021-03-19 | Disposition: A | Discharge status: Home / Self Care | Payer: Medicare Other | Attending: Emergency Medicine | Admitting: Emergency Medicine

## 2021-03-19 ENCOUNTER — Emergency Department: Payer: Medicare Other

## 2021-03-19 DIAGNOSIS — X58XXXA Exposure to other specified factors, initial encounter: Secondary | ICD-10-CM | POA: Diagnosis not present

## 2021-03-19 DIAGNOSIS — J45909 Unspecified asthma, uncomplicated: Secondary | ICD-10-CM | POA: Diagnosis not present

## 2021-03-19 DIAGNOSIS — T17208A Unspecified foreign body in pharynx causing other injury, initial encounter: Secondary | ICD-10-CM

## 2021-03-19 DIAGNOSIS — T17228A Food in pharynx causing other injury, initial encounter: Secondary | ICD-10-CM | POA: Insufficient documentation

## 2021-03-19 NOTE — ED Provider Notes (Signed)
? ?[facility] ?Provider Note ? ? ? Event Date/Time  ? First MD Initiated Contact with Patient 03/19/21 1152   ?  (approximate) ? ? ?History  ? ?Chief Complaint ?Foreign Body (In throat) ? ? ?HPI ?Jessica Norman is a 71 y.o. female, history of asthma, hyperlipidemia, lumbar stenosis, disturbed cognition, presents to the emergency department for evaluation of foreign body.  Patient states that she was eating fish approximately 2 days prior.  She states that this fish had several bones in it and feels like she swallowed some.  However, she states that she feels like 1 fishbone is stuck along the left side of her throat.  She states that it is mildly sore, but otherwise does not bother her while eating/drinking.  Denies fever/chills, dysphagia, shortness of breath, chest pain, headache, rashes/lesions, or abdominal pain. ? ?History Limitations: No limitations ? ?  ? ? ?Physical Exam  ?Triage Vital Signs: ?ED Triage Vitals  ?Enc Vitals Group  ?   BP 03/19/21 1144 117/74  ?   Pulse Rate 03/19/21 1144 (!) 58  ?   Resp 03/19/21 1144 18  ?   Temp 03/19/21 1144 98.5 ?F (36.9 ?C)  ?   Temp Source 03/19/21 1144 Oral  ?   SpO2 03/19/21 1144 99 %  ?   Weight 03/19/21 1143 199 lb (90.3 kg)  ?   Height 03/19/21 1143 5\' 9"  (1.753 m)  ?   Head Circumference --   ?   Peak Flow --   ?   Pain Score 03/19/21 1141 5  ?   Pain Loc --   ?   Pain Edu? --   ?   Excl. in GC? --   ? ? ?Most recent vital signs: ?Vitals:  ? 03/19/21 1144  ?BP: 117/74  ?Pulse: (!) 58  ?Resp: 18  ?Temp: 98.5 ?F (36.9 ?C)  ?SpO2: 99%  ? ? ?General: Awake, NAD.  ?CV: Good peripheral perfusion.  ?Resp: Normal effort.  ?Abd: Soft, non-tender. No distention.  ?Neuro: At baseline. No gross neurological deficits. ?Other: No foreign body appreciated on throat examination.  No erythema, bleeding/discharge, or tenderness.  Neck exam unremarkable.  No evidence of masses or lymphadenopathy.  No tenderness ? ?Physical Exam ? ? ? ?ED Results / Procedures  / Treatments  ?Labs ?(all labs ordered are listed, but only abnormal results are displayed) ?Labs Reviewed - No data to display ? ? ?EKG ?Not applicable ? ? ?RADIOLOGY ? ?ED Provider Interpretation: I personally reviewed this image, no foreign body evidence noted ? ?DG Neck Soft Tissue ? ?Result Date: 03/19/2021 ?CLINICAL DATA:  Possible foreign body. EXAM: NECK SOFT TISSUES - 1+ VIEW COMPARISON:  CT of December 11, 2014. FINDINGS: There is no evidence of retropharyngeal soft tissue swelling or epiglottic enlargement. The cervical airway is unremarkable and no radio-opaque foreign body identified. Probable right cervical carotid artery calcifications are noted. Status post surgical anterior fusion of C3-4 and C4-5. IMPRESSION: Status post surgical anterior fusion of C3-4 and C4-5. Probable right cervical carotid artery calcifications are noted. No definite radiopaque foreign body is noted. Electronically Signed   By: December 13, 2014 M.D.   On: 03/19/2021 12:44   ? ?PROCEDURES: ? ?Critical Care performed: None ? ?Procedures ? ? ? ?MEDICATIONS ORDERED IN ED: ?Medications - No data to display ? ? ?IMPRESSION / MDM / ASSESSMENT AND PLAN / ED COURSE  ?I reviewed the triage vital signs and the nursing notes. ?             ?               ? ? ?  Differential diagnosis includes, but is not limited to, foreign body, esophageal perforation, Mallory-Weiss tear ? ?ED Course ?Patient appears well.  Vital signs within normal limits.  NAD ? ?X-ray shows no evidence of foreign body.  Incidental findings noted and communicated to the patient. ? ?Assessment/Plan ?Given the patient's history, physical exam, and work-up thus far, I do not suspect any serious or life-threatening pathology.  The patient endorses foreign body sensation in her throat, DG soft tissue neck shows no evidence of such.  Likely experiencing mild soft tissue irritation or injury, though cannot definitively rule out foreign body at this time.  Will provide patient  with a referral to gastroenterology if patient continues to feel foreign body sensation over the course of the week.  Encouraged her to utilize over-the-counter benzocaine or cough drops as needed for relief. We will discharge this patient.  ? ?Patient was provided with anticipatory guidance, return precautions, and educational material. Encouraged the patient to return to the emergency department at any time if they begin to experience any new or worsening symptoms.  ? ?  ? ? ?FINAL CLINICAL IMPRESSION(S) / ED DIAGNOSES  ? ?Final diagnoses:  ?Foreign body in pharynx, initial encounter  ? ? ? ?Rx / DC Orders  ? ?ED Discharge Orders   ? ? None  ? ?  ? ? ? ?Note:  This document was prepared using Dragon voice recognition software and may include unintentional dictation errors. ?  ?Varney Daily, [place] ?03/19/21 1616 ? ?  ?Arnaldo Natal, MD ?03/19/21 1658 ? ?

## 2021-03-19 NOTE — ED Triage Notes (Signed)
Pt was eating fish on Tuesday and swallowed a fish bone and feels like it is stuck on the left side of her throat. Pt in NAD, denies swelling, pt still able to swallow.  ?

## 2021-03-19 NOTE — Discharge Instructions (Addendum)
-  Follow-up with the gastroenterologist listed above, as discussed ?-Treat pain with Tylenol/ibuprofen as needed. ?-Return to the emergency department anytime if you begin to experience any new or worsening symptoms. ?

## 2022-03-31 ENCOUNTER — Other Ambulatory Visit: Payer: Self-pay | Admitting: Physical Medicine & Rehabilitation

## 2022-03-31 DIAGNOSIS — M48062 Spinal stenosis, lumbar region with neurogenic claudication: Secondary | ICD-10-CM

## 2022-04-01 ENCOUNTER — Encounter: Payer: Self-pay | Admitting: Physical Medicine & Rehabilitation

## 2022-04-01 ENCOUNTER — Other Ambulatory Visit: Payer: Self-pay | Admitting: Family Medicine

## 2022-04-01 DIAGNOSIS — Z1231 Encounter for screening mammogram for malignant neoplasm of breast: Secondary | ICD-10-CM

## 2022-04-06 ENCOUNTER — Ambulatory Visit
Admission: RE | Admit: 2022-04-06 | Discharge: 2022-04-06 | Disposition: A | Discharge status: Home / Self Care | Payer: Medicare Other | Source: Ambulatory Visit | Attending: Family Medicine | Admitting: Family Medicine

## 2022-04-06 ENCOUNTER — Other Ambulatory Visit: Payer: Self-pay | Admitting: Family Medicine

## 2022-04-06 ENCOUNTER — Ambulatory Visit: Payer: Medicare Other

## 2022-04-06 DIAGNOSIS — Z1231 Encounter for screening mammogram for malignant neoplasm of breast: Secondary | ICD-10-CM

## 2022-04-10 ENCOUNTER — Ambulatory Visit
Admission: RE | Admit: 2022-04-10 | Discharge: 2022-04-10 | Disposition: A | Discharge status: Home / Self Care | Payer: Medicare Other | Source: Ambulatory Visit | Attending: Physical Medicine & Rehabilitation | Admitting: Physical Medicine & Rehabilitation

## 2022-04-10 DIAGNOSIS — M48062 Spinal stenosis, lumbar region with neurogenic claudication: Secondary | ICD-10-CM

## 2022-04-20 ENCOUNTER — Ambulatory Visit
Admission: RE | Admit: 2022-04-20 | Discharge: 2022-04-20 | Disposition: A | Discharge status: Home / Self Care | Payer: Medicare Other | Source: Ambulatory Visit | Attending: Physical Medicine & Rehabilitation | Admitting: Physical Medicine & Rehabilitation

## 2022-05-25 ENCOUNTER — Other Ambulatory Visit: Payer: Self-pay | Admitting: Otolaryngology

## 2022-05-25 DIAGNOSIS — H9042 Sensorineural hearing loss, unilateral, left ear, with unrestricted hearing on the contralateral side: Secondary | ICD-10-CM

## 2022-06-17 ENCOUNTER — Other Ambulatory Visit: Payer: Medicare Other

## 2022-06-30 ENCOUNTER — Ambulatory Visit
Admission: RE | Admit: 2022-06-30 | Discharge: 2022-06-30 | Disposition: A | Discharge status: Home / Self Care | Payer: Medicare Other | Source: Ambulatory Visit | Attending: Otolaryngology | Admitting: Otolaryngology

## 2022-06-30 DIAGNOSIS — H9042 Sensorineural hearing loss, unilateral, left ear, with unrestricted hearing on the contralateral side: Secondary | ICD-10-CM

## 2022-06-30 MED ORDER — GADOPICLENOL 0.5 MMOL/ML IV SOLN
10.0000 mL | Freq: Once | INTRAVENOUS | Status: AC | PRN
  Administered 2022-06-30: 10 mL via INTRAVENOUS

## 2022-11-24 ENCOUNTER — Other Ambulatory Visit: Payer: Self-pay | Admitting: Internal Medicine

## 2022-11-24 DIAGNOSIS — R079 Chest pain, unspecified: Secondary | ICD-10-CM

## 2022-11-25 ENCOUNTER — Other Ambulatory Visit: Payer: Self-pay | Admitting: Internal Medicine

## 2022-11-25 ENCOUNTER — Ambulatory Visit
Admission: RE | Admit: 2022-11-25 | Discharge: 2022-11-25 | Disposition: A | Discharge status: Home / Self Care | Payer: Self-pay | Source: Ambulatory Visit | Attending: Internal Medicine | Admitting: Internal Medicine

## 2022-11-25 DIAGNOSIS — R079 Chest pain, unspecified: Secondary | ICD-10-CM

## 2022-12-01 ENCOUNTER — Ambulatory Visit
Admission: RE | Admit: 2022-12-01 | Discharge: 2022-12-01 | Disposition: A | Discharge status: Home / Self Care | Payer: Medicare Other | Source: Ambulatory Visit | Attending: Internal Medicine | Admitting: Internal Medicine

## 2022-12-01 DIAGNOSIS — I10 Essential (primary) hypertension: Secondary | ICD-10-CM | POA: Diagnosis not present

## 2022-12-01 DIAGNOSIS — R079 Chest pain, unspecified: Secondary | ICD-10-CM | POA: Insufficient documentation

## 2022-12-29 ENCOUNTER — Other Ambulatory Visit: Payer: Self-pay

## 2022-12-29 ENCOUNTER — Ambulatory Visit
Admission: RE | Admit: 2022-12-29 | Discharge: 2022-12-29 | Disposition: A | Discharge status: Home / Self Care | Payer: Medicare Other | Attending: Internal Medicine | Admitting: Internal Medicine

## 2022-12-29 ENCOUNTER — Encounter
Admission: RE | Disposition: A | Discharge status: Home / Self Care | Payer: Self-pay | Source: Home / Self Care | Attending: Internal Medicine

## 2022-12-29 DIAGNOSIS — K219 Gastro-esophageal reflux disease without esophagitis: Secondary | ICD-10-CM | POA: Diagnosis not present

## 2022-12-29 DIAGNOSIS — Z87891 Personal history of nicotine dependence: Secondary | ICD-10-CM | POA: Insufficient documentation

## 2022-12-29 DIAGNOSIS — E669 Obesity, unspecified: Secondary | ICD-10-CM | POA: Insufficient documentation

## 2022-12-29 DIAGNOSIS — Z8249 Family history of ischemic heart disease and other diseases of the circulatory system: Secondary | ICD-10-CM | POA: Diagnosis not present

## 2022-12-29 DIAGNOSIS — M3501 Sicca syndrome with keratoconjunctivitis: Secondary | ICD-10-CM | POA: Diagnosis not present

## 2022-12-29 DIAGNOSIS — R9430 Abnormal result of cardiovascular function study, unspecified: Secondary | ICD-10-CM | POA: Diagnosis present

## 2022-12-29 DIAGNOSIS — I2584 Coronary atherosclerosis due to calcified coronary lesion: Secondary | ICD-10-CM | POA: Diagnosis not present

## 2022-12-29 DIAGNOSIS — J4520 Mild intermittent asthma, uncomplicated: Secondary | ICD-10-CM | POA: Insufficient documentation

## 2022-12-29 DIAGNOSIS — R601 Generalized edema: Secondary | ICD-10-CM | POA: Diagnosis not present

## 2022-12-29 DIAGNOSIS — Z6833 Body mass index (BMI) 33.0-33.9, adult: Secondary | ICD-10-CM | POA: Diagnosis not present

## 2022-12-29 DIAGNOSIS — I2510 Atherosclerotic heart disease of native coronary artery without angina pectoris: Secondary | ICD-10-CM | POA: Diagnosis not present

## 2022-12-29 DIAGNOSIS — E785 Hyperlipidemia, unspecified: Secondary | ICD-10-CM | POA: Insufficient documentation

## 2022-12-29 DIAGNOSIS — Z79899 Other long term (current) drug therapy: Secondary | ICD-10-CM | POA: Insufficient documentation

## 2022-12-29 HISTORY — PX: LEFT HEART CATH AND CORONARY ANGIOGRAPHY: CATH118249

## 2022-12-29 LAB — CBC
HCT: 34.4 % — ABNORMAL LOW (ref 36.0–46.0)
Hemoglobin: 11.1 g/dL — ABNORMAL LOW (ref 12.0–15.0)
MCH: 27.9 pg (ref 26.0–34.0)
MCHC: 32.3 g/dL (ref 30.0–36.0)
MCV: 86.4 fL (ref 80.0–100.0)
Platelets: 141 10*3/uL — ABNORMAL LOW (ref 150–400)
RBC: 3.98 MIL/uL (ref 3.87–5.11)
RDW: 13.4 % (ref 11.5–15.5)
WBC: 4.5 10*3/uL (ref 4.0–10.5)
nRBC: 0 % (ref 0.0–0.2)

## 2022-12-29 LAB — BASIC METABOLIC PANEL
Anion gap: 5 (ref 5–15)
BUN: 16 mg/dL (ref 8–23)
CO2: 27 mmol/L (ref 22–32)
Calcium: 8.6 mg/dL — ABNORMAL LOW (ref 8.9–10.3)
Chloride: 105 mmol/L (ref 98–111)
Creatinine, Ser: 0.82 mg/dL (ref 0.44–1.00)
GFR, Estimated: >60 mL/min (ref >60)
Glucose, Bld: 79 mg/dL (ref 70–99)
Potassium: 4 mmol/L (ref 3.5–5.1)
Sodium: 137 mmol/L (ref 135–145)

## 2022-12-29 SURGERY — LEFT HEART CATH AND CORONARY ANGIOGRAPHY
Anesthesia: Moderate Sedation | Laterality: Left

## 2022-12-29 MED ORDER — SODIUM CHLORIDE 0.9% FLUSH: 3.0000 mL | Freq: Two times a day (BID) | INTRAVENOUS | Status: DC

## 2022-12-29 MED ORDER — LIDOCAINE HCL 1 % IJ SOLN
INTRAMUSCULAR | Status: AC
  Filled 2022-12-29: qty 20

## 2022-12-29 MED ORDER — MIDAZOLAM HCL 2 MG/2ML IJ SOLN
INTRAMUSCULAR | Status: DC | PRN
  Administered 2022-12-29: 1 mg via INTRAVENOUS

## 2022-12-29 MED ORDER — IOHEXOL 300 MG/ML  SOLN
INTRAMUSCULAR | Status: DC | PRN
  Administered 2022-12-29: 72 mL

## 2022-12-29 MED ORDER — HEPARIN (PORCINE) IN NACL 2000-0.9 UNIT/L-% IV SOLN
INTRAVENOUS | Status: DC | PRN
  Administered 2022-12-29: 1000 mL

## 2022-12-29 MED ORDER — LABETALOL HCL 5 MG/ML IV SOLN
INTRAVENOUS | Status: AC
  Filled 2022-12-29: qty 4

## 2022-12-29 MED ORDER — MIDAZOLAM HCL 2 MG/2ML IJ SOLN
INTRAMUSCULAR | Status: AC
  Filled 2022-12-29: qty 2

## 2022-12-29 MED ORDER — VERAPAMIL HCL 2.5 MG/ML IV SOLN
INTRAVENOUS | Status: DC | PRN
  Administered 2022-12-29: 2.5 mg via INTRAVENOUS

## 2022-12-29 MED ORDER — FENTANYL CITRATE (PF) 100 MCG/2ML IJ SOLN
INTRAMUSCULAR | Status: DC | PRN
  Administered 2022-12-29: 25 ug via INTRAVENOUS

## 2022-12-29 MED ORDER — LABETALOL HCL 5 MG/ML IV SOLN
INTRAVENOUS | Status: DC | PRN
  Administered 2022-12-29: 10 mg via INTRAVENOUS

## 2022-12-29 MED ORDER — VERAPAMIL HCL 2.5 MG/ML IV SOLN
INTRAVENOUS | Status: AC
  Filled 2022-12-29: qty 2

## 2022-12-29 MED ORDER — SODIUM CHLORIDE 0.9 % WEIGHT BASED INFUSION
3.0000 mL/kg/h | INTRAVENOUS | Status: AC
  Administered 2022-12-29: 3 mL/kg/h via INTRAVENOUS

## 2022-12-29 MED ORDER — SODIUM CHLORIDE 0.9% FLUSH: 3.0000 mL | INTRAVENOUS | Status: DC | PRN

## 2022-12-29 MED ORDER — HEPARIN SODIUM (PORCINE) 1000 UNIT/ML IJ SOLN
INTRAMUSCULAR | Status: DC | PRN
  Administered 2022-12-29: 4000 [IU] via INTRAVENOUS

## 2022-12-29 MED ORDER — FENTANYL CITRATE (PF) 100 MCG/2ML IJ SOLN
INTRAMUSCULAR | Status: AC
  Filled 2022-12-29: qty 2

## 2022-12-29 MED ORDER — SODIUM CHLORIDE 0.9 % IV SOLN: 250.0000 mL | INTRAVENOUS | Status: DC | PRN

## 2022-12-29 MED ORDER — HEPARIN (PORCINE) IN NACL 1000-0.9 UT/500ML-% IV SOLN
INTRAVENOUS | Status: AC
  Filled 2022-12-29: qty 1000

## 2022-12-29 MED ORDER — HEPARIN SODIUM (PORCINE) 1000 UNIT/ML IJ SOLN
INTRAMUSCULAR | Status: AC
Start: 2022-12-29 — End: ?
  Filled 2022-12-29: qty 10

## 2022-12-29 MED ORDER — VERAPAMIL HCL 2.5 MG/ML IV SOLN
INTRAVENOUS | Status: AC
Start: 2022-12-29 — End: ?
  Filled 2022-12-29: qty 2

## 2022-12-29 MED ORDER — SODIUM CHLORIDE 0.9 % WEIGHT BASED INFUSION: 1.0000 mL/kg/h | INTRAVENOUS | Status: DC

## 2022-12-29 MED ORDER — HEPARIN (PORCINE) IN NACL 1000-0.9 UT/500ML-% IV SOLN
INTRAVENOUS | Status: AC
  Filled 2022-12-29: qty 500

## 2022-12-29 MED ORDER — SODIUM CHLORIDE 0.9 % WEIGHT BASED INFUSION
1.0000 mL/kg/h | INTRAVENOUS | Status: DC
Start: 2022-12-29 — End: 2022-12-29

## 2022-12-29 MED ORDER — ASPIRIN 81 MG PO CHEW
81.0000 mg | CHEWABLE_TABLET | ORAL | Status: AC
  Administered 2022-12-29: 81 mg via ORAL

## 2022-12-29 MED ORDER — LIDOCAINE HCL (PF) 1 % IJ SOLN
INTRAMUSCULAR | Status: DC | PRN
  Administered 2022-12-29: 2 mL

## 2022-12-29 MED ORDER — ASPIRIN 81 MG PO CHEW
CHEWABLE_TABLET | ORAL | Status: AC
Start: 2022-12-29 — End: ?
  Filled 2022-12-29: qty 1

## 2022-12-29 SURGICAL SUPPLY — 10 items
CATH 5FR JL3.5 JR4 ANG PIG MP (CATHETERS) IMPLANT
DEVICE RAD TR BAND REGULAR (VASCULAR PRODUCTS) IMPLANT
DRAPE BRACHIAL (DRAPES) IMPLANT
GLIDESHEATH SLEND SS 6F .021 (SHEATH) IMPLANT
GUIDEWIRE INQWIRE 1.5J.035X260 (WIRE) IMPLANT
INQWIRE 1.5J .035X260CM (WIRE) ×1
PACK CARDIAC CATH (CUSTOM PROCEDURE TRAY) ×1 IMPLANT
PROTECTION STATION PRESSURIZED (MISCELLANEOUS) ×1
SET ATX-X65L (MISCELLANEOUS) IMPLANT
STATION PROTECTION PRESSURIZED (MISCELLANEOUS) IMPLANT

## 2022-12-29 NOTE — Discharge Instructions (Signed)
Radial Site Care Refer to this sheet in the next few weeks. These instructions provide you with information about caring for yourself after your procedure. Your health care provider may also give you more specific instructions. Your treatment has been planned according to current medical practices, but problems sometimes occur. Call your health care provider if you have any problems or questions after your procedure. What can I expect after the procedure? After your procedure, it is typical to have the following: Bruising at the radial site that usually fades within 1-2 weeks. Blood collecting in the tissue (hematoma) that may be painful to the touch. It should usually decrease in size and tenderness within 1-2 weeks.  Follow these instructions at home: Take medicines only as directed by your health care provider. If you are on a medication called Metformin please do not take for 48 hours after your procedure. Over the next 48hrs please increase your fluid intake of water and non caffeine beverages to flush the contrast dye out of your system.  You may shower 24 hours after the procedure  Leave your bandage on and gently wash the site with plain soap and water. Pat the area dry with a clean towel. Do not rub the site, because this may cause bleeding.  Remove your dressing 48hrs after your procedure and leave open to air.  Do not submerge your site in water for 7 days. This includes swimming and washing dishes.  Check your insertion site every day for redness, swelling, or drainage. Do not apply powder or lotion to the site. Do not flex or bend the affected arm for 24 hours or as directed by your health care provider. Do not push or pull heavy objects with the affected arm for 24 hours or as directed by your health care provider. Do not lift over 10 lb (4.5 kg) for 5 days after your procedure or as directed by your health care provider. Ask your health care provider when it is okay to: Return to  work or school. Resume usual physical activities or sports. Resume sexual activity. Do not drive home if you are discharged the same day as the procedure. Have someone else drive you. You may drive 48 hours after the procedure Do not operate machinery or power tools for 24 hours after the procedure. If your procedure was done as an outpatient procedure, which means that you went home the same day as your procedure, a responsible adult should be with you for the first 24 hours after you arrive home. Keep all follow-up visits as directed by your health care provider. This is important. Contact a health care provider if: You have a fever. You have chills. You have increased bleeding from the radial site. Hold pressure on the site. Get help right away if: You have unusual pain at the radial site. You have redness, warmth, or swelling at the radial site. You have drainage (other than a small amount of blood on the dressing) from the radial site. The radial site is bleeding, and the bleeding does not stop after 15 minutes of holding steady pressure on the site. Your arm or hand becomes pale, cool, tingly, or numb. This information is not intended to replace advice given to you by your health care provider. Make sure you discuss any questions you have with your health care provider. Document Released: 02/06/2010 Document Revised: 06/12/2015 Document Reviewed: 07/23/2013 Elsevier Interactive Patient Education  2018 [employer]  

## 2022-12-31 ENCOUNTER — Encounter: Payer: Self-pay | Admitting: Internal Medicine

## 2022-12-31 LAB — CARDIAC CATHETERIZATION: Cath EF Quantitative: 55 %

## 2023-03-18 ENCOUNTER — Other Ambulatory Visit: Payer: Self-pay | Admitting: Family Medicine

## 2023-03-18 DIAGNOSIS — Z780 Asymptomatic menopausal state: Secondary | ICD-10-CM

## 2023-04-26 ENCOUNTER — Emergency Department

## 2023-04-26 ENCOUNTER — Encounter: Payer: Self-pay | Admitting: Emergency Medicine

## 2023-04-26 ENCOUNTER — Other Ambulatory Visit: Payer: Self-pay

## 2023-04-26 ENCOUNTER — Emergency Department: Admission: EM | Admit: 2023-04-26 | Discharge: 2023-04-26 | Disposition: A | Discharge status: Home / Self Care

## 2023-04-26 DIAGNOSIS — I10 Essential (primary) hypertension: Secondary | ICD-10-CM | POA: Insufficient documentation

## 2023-04-26 DIAGNOSIS — R519 Headache, unspecified: Secondary | ICD-10-CM | POA: Diagnosis present

## 2023-04-26 LAB — BASIC METABOLIC PANEL WITH GFR
Anion gap: 7 (ref 5–15)
BUN: 17 mg/dL (ref 8–23)
CO2: 25 mmol/L (ref 22–32)
Calcium: 9 mg/dL (ref 8.9–10.3)
Chloride: 109 mmol/L (ref 98–111)
Creatinine, Ser: 0.74 mg/dL (ref 0.44–1.00)
GFR, Estimated: >60 mL/min (ref >60)
Glucose, Bld: 83 mg/dL (ref 70–99)
Potassium: 4.2 mmol/L (ref 3.5–5.1)
Sodium: 141 mmol/L (ref 135–145)

## 2023-04-26 LAB — CBC WITH DIFFERENTIAL/PLATELET
Abs Immature Granulocytes: 0.01 10*3/uL (ref 0.00–0.07)
Basophils Absolute: 0 10*3/uL (ref 0.0–0.1)
Basophils Relative: 1 %
Eosinophils Absolute: 0.3 10*3/uL (ref 0.0–0.5)
Eosinophils Relative: 7 %
HCT: 34.2 % — ABNORMAL LOW (ref 36.0–46.0)
Hemoglobin: 10.9 g/dL — ABNORMAL LOW (ref 12.0–15.0)
Immature Granulocytes: 0 %
Lymphocytes Relative: 11 %
Lymphs Abs: 0.5 10*3/uL — ABNORMAL LOW (ref 0.7–4.0)
MCH: 26.7 pg (ref 26.0–34.0)
MCHC: 31.9 g/dL (ref 30.0–36.0)
MCV: 83.6 fL (ref 80.0–100.0)
Monocytes Absolute: 0.5 10*3/uL (ref 0.1–1.0)
Monocytes Relative: 12 %
Neutro Abs: 3.1 10*3/uL (ref 1.7–7.7)
Neutrophils Relative %: 69 %
Platelets: 157 10*3/uL (ref 150–400)
RBC: 4.09 MIL/uL (ref 3.87–5.11)
RDW: 13.8 % (ref 11.5–15.5)
WBC: 4.5 10*3/uL (ref 4.0–10.5)
nRBC: 0 % (ref 0.0–0.2)

## 2023-04-26 LAB — TROPONIN I (HIGH SENSITIVITY): Troponin I (High Sensitivity): 10 ng/L (ref <18)

## 2023-04-26 MED ORDER — ACETAMINOPHEN 500 MG PO TABS
1000.0000 mg | ORAL_TABLET | Freq: Once | ORAL | Status: AC
  Administered 2023-04-26: 1000 mg via ORAL
  Filled 2023-04-26: qty 2

## 2023-04-26 MED ORDER — METOCLOPRAMIDE HCL 5 MG/ML IJ SOLN
10.0000 mg | Freq: Once | INTRAMUSCULAR | Status: AC
  Administered 2023-04-26: 10 mg via INTRAVENOUS
  Filled 2023-04-26: qty 2

## 2023-04-26 MED ORDER — SODIUM CHLORIDE 0.9 % IV BOLUS
250.0000 mL | Freq: Once | INTRAVENOUS | Status: AC
  Administered 2023-04-26: 250 mL via INTRAVENOUS

## 2023-04-26 NOTE — ED Notes (Signed)
 See triage note  Presents from PCP with elevated b/p and headache  States she has had a problem with her b/p for a few weeks  Has had some medication changes

## 2023-04-26 NOTE — ED Provider Notes (Signed)
 [facility] Provider Note    Event Date/Time   First MD Initiated Contact with Patient 04/26/23 1039     (approximate)   History   Hypertension Patient to ED via POV from Dr office for hypertension and headache. States it was over 200 in the office. Also having blurred vision. Has been taking BP meds as prescribed.    HPI  Jessica Norman is a 73 y.o. female  pmh htn, hld, bipolar disorder, back pain presents for eval of HTN, HA - pt had  PMNR appt today, planned for RFA procedure for back pain but sent to ED due to HTN (SBP >200) w/ HA. Procedure deferred.  - On my eval, states has had intermittent L-sided HA over past week. Acute onset today aroudn 9:30 AM. Also mild associated blurry vision, no focal weakness or numbness. Has had many similar HA in past. Pain 7/10. Took home meds including BP meds today AM, has not taken pain meds.  - also notes mild vague chest tightness throughout the day today but states this is chronic for her. No shortness of breath.  - no preceding head trauma, not on thinners      Physical Exam   Triage Vital Signs: ED Triage Vitals  Encounter Vitals Group     BP 04/26/23 1016 (!) 173/133     Systolic BP Percentile --      Diastolic BP Percentile --      Pulse Rate 04/26/23 1016 64     Resp 04/26/23 1016 17     Temp 04/26/23 1016 98.3 F (36.8 C)     Temp Source 04/26/23 1016 Oral     SpO2 04/26/23 1016 96 %     Weight 04/26/23 1015 212 lb 4.9 oz (96.3 kg)     Height 04/26/23 1015 5\' 9"  (1.753 m)     Head Circumference --      Peak Flow --      Pain Score 04/26/23 1015 9     Pain Loc --      Pain Education --      Exclude from Growth Chart --     Most recent vital signs: Vitals:   04/26/23 1411 04/26/23 1412  BP: (!) 190/87   Pulse: 68   Resp: 16   Temp:  98 F (36.7 C)  SpO2: 98%    General: Awake, no distress.  CV:  Good peripheral perfusion. RRR, RP 2+, no murmurs appreciated.  Resp:  Normal effort.  CTAB. Abd:  No distention. No ttp.  Head:   Atraumatic  Neuro:  Moving all extremity spontaneously   ED Results / Procedures / Treatments   Labs (all labs ordered are listed, but only abnormal results are displayed) Labs Reviewed  CBC WITH DIFFERENTIAL/PLATELET - Abnormal; Notable for the following components:      Result Value   Hemoglobin 10.9 (*)    HCT 34.2 (*)    Lymphs Abs 0.5 (*)    All other components within normal limits  BASIC METABOLIC PANEL WITH GFR  TROPONIN I (HIGH SENSITIVITY)     EKG  Ecg = sinus bradycardia, rate 58, no ST elevation or depression, no significant repolarization abnormality, normal axis, normal intervals.  Some left axis deviation present.  No evidence of ischemia on my read.   RADIOLOGY See ED course below.     PROCEDURES:  Critical Care performed: No  Procedures   MEDICATIONS ORDERED IN ED: Medications  acetaminophen (TYLENOL) tablet 1,000  mg (1,000 mg Oral Given 04/26/23 1211)  metoCLOPramide (REGLAN) injection 10 mg (10 mg Intravenous Given 04/26/23 1216)  sodium chloride 0.9 % bolus 250 mL (0 mLs Intravenous Stopped 04/26/23 1332)     IMPRESSION / MDM / ASSESSMENT AND PLAN / ED COURSE  I reviewed the triage vital signs and the nursing notes.                              Differential diagnosis includes, but is not limited to, consider possibility of hypertensive emergency though suspect hypertension may actually be secondary to patient's headache and back pain.  Given age with history of acute onset headache in the setting of hypertension, will screen for [facility] or other intracranial pathology with CT head.  Will defer acute blood pressure management, systolic 160s at time of my eval though diastolic elevated--plan for more aggressive blood pressure management if not improving with symptomatic control.  Will give migraine cocktail, add troponin and EKG do screening labs ordered at triage and get screening chest x-ray.  No evidence of  flash pulmonary edema at this time.  Plan -Labs - EKG -CT head -Chest x-ray -Tylenol, Reglan, small bolus IV fluid -Monitor BP closely, intervene as needed  Patient's presentation is most consistent with acute presentation with potential threat to life or bodily function.  The patient is on the cardiac monitor to evaluate for evidence of arrhythmia and/or significant heart rate changes.  See ED course below.  Workup unremarkable, no evidence of endorgan damage.  Treated headache, all symptoms resolved.  Blood pressure did improve to 190s over 80s.  Patient tells me when she used to check her blood pressure at home it was usually in the 170s.  At this point I believe has asymptomatic hypertension that was likely exacerbated by a headache.  Will evaluate aggressive blood pressure control at this point given patient is likely usually hypertensive but recommend close PMD follow-up for repeat blood pressure check and possible blood pressure medication management. Is already on several antihypertensives.  ED return precautions in place.  Patient and family bedside agree with plan.  Clinical Course as of 04/26/23 1420  Tue Apr 26, 2023  1056 CBC, BMP unremarkable [MM]  1201 Trop wnl [MM]  1231 Cxr reviewed, unremarkable on my interpretation. Radiology report reviewed [MM]  1336 Called radiology, CT not yet assigned, signing out [MM]  1357 Formal radiology read completed, agree no acute pathology.  Same as my interpretation. [MM]    Clinical Course User Index [MM] Marinell Blight, MD     FINAL CLINICAL IMPRESSION(S) / ED DIAGNOSES   Final diagnoses:  Hypertension, unspecified type  Nonintractable headache, unspecified chronicity pattern, unspecified headache type     Rx / DC Orders   ED Discharge Orders     None        Note:  This document was prepared using Dragon voice recognition software and may include unintentional dictation errors.   Marinell Blight, MD 04/26/23  1420

## 2023-04-26 NOTE — Discharge Instructions (Addendum)
 Your evaluation in the emergency department was notable for an elevated blood pressure.  Your workup was reassuring, and your blood pressure did improve with treating your pain.  Please continue to take all of your blood pressure medications as already prescribed, and follow-up with your primary care doctor for reevaluation.  They may wish to increase your blood pressure medications.  Return to the emergency department with any new or worsening symptoms.

## 2023-04-26 NOTE — ED Triage Notes (Signed)
 Patient to ED via POV from Dr office for hypertension and headache. States it was over 200 in the office. Also having blurred vision. Has been taking BP meds as prescribed.

## 2023-08-19 ENCOUNTER — Other Ambulatory Visit: Payer: Self-pay | Admitting: Physician Assistant

## 2023-08-19 DIAGNOSIS — M4807 Spinal stenosis, lumbosacral region: Secondary | ICD-10-CM

## 2023-08-19 DIAGNOSIS — R200 Anesthesia of skin: Secondary | ICD-10-CM

## 2023-08-19 DIAGNOSIS — R2689 Other abnormalities of gait and mobility: Secondary | ICD-10-CM

## 2023-08-19 DIAGNOSIS — N39498 Other specified urinary incontinence: Secondary | ICD-10-CM

## 2023-08-20 ENCOUNTER — Ambulatory Visit
Admission: RE | Admit: 2023-08-20 | Discharge: 2023-08-20 | Disposition: A | Discharge status: Home / Self Care | Source: Ambulatory Visit | Attending: Physician Assistant | Admitting: Physician Assistant

## 2023-08-20 DIAGNOSIS — R200 Anesthesia of skin: Secondary | ICD-10-CM | POA: Diagnosis present

## 2023-08-20 DIAGNOSIS — R2689 Other abnormalities of gait and mobility: Secondary | ICD-10-CM | POA: Diagnosis present

## 2023-08-20 DIAGNOSIS — M4807 Spinal stenosis, lumbosacral region: Secondary | ICD-10-CM | POA: Insufficient documentation

## 2023-08-20 DIAGNOSIS — R202 Paresthesia of skin: Secondary | ICD-10-CM | POA: Diagnosis present

## 2023-08-20 DIAGNOSIS — N39498 Other specified urinary incontinence: Secondary | ICD-10-CM | POA: Diagnosis present

## 2023-08-20 MED ORDER — GADOBUTROL 1 MMOL/ML IV SOLN
9.0000 mL | Freq: Once | INTRAVENOUS | Status: AC | PRN
  Administered 2023-08-20: 9 mL via INTRAVENOUS

## 2023-09-05 ENCOUNTER — Other Ambulatory Visit: Payer: Self-pay | Admitting: Family Medicine

## 2023-09-05 ENCOUNTER — Inpatient Hospital Stay
Admission: RE | Admit: 2023-09-05 | Discharge: 2023-09-05 | Disposition: A | Discharge status: Home / Self Care | Payer: Self-pay | Source: Ambulatory Visit | Attending: Orthopedic Surgery | Admitting: Orthopedic Surgery

## 2023-09-05 DIAGNOSIS — Z049 Encounter for examination and observation for unspecified reason: Secondary | ICD-10-CM

## 2023-09-09 ENCOUNTER — Encounter: Payer: Self-pay | Admitting: Orthopedic Surgery

## 2023-09-09 NOTE — Progress Notes (Unsigned)
 Referring Physician:  Dodson Delon FERNS, MD [address] [place],  [place] [address]  Primary Physician:  Buren Rock HERO, MD  History of Present Illness: 09/15/2023 [name] has a history of HTN, asthma, fatty liver, hyperlipidemia, bipolar, GERD, sjogrens syndrome  History of lumbar laminectomy L2-L4 on 02/04/20 by Dr. Bluford. She feels like she did not get better after this surgery.   Has seen PMR at [facility] and had multiple injections.   She has intermittent LBP with bilateral posterior leg pain to her feet. She has intermittent numbness/tingling in her legs/feet. She has weakness in her legs as well. Symptoms are worse with standing, walking, and increased activity. Better with sitting or laying flat.   History of cervical fusion and she did well after surgery. She has intermittent pain in her neck. No arm pain. She has intermittent numbness and tingling in the arms. Drops things sometimes. She has balance issues that are getting worse.   Did well with lumbar injection about at month ago, it is helping.   Tobacco use: Does not smoke.   Bowel/Bladder Dysfunction: none  Conservative measures:  Physical therapy: has participated in at [facility], this was over a year ago.  Multimodal medical therapy including regular antiinflammatories: naproxen, gabapentin Injections:  03/18/22: Bilateral S1 TFESI (50% relief) 07/09/22: Bilateral S1 TFESI (80% relief) 09/17/22: Bilateral L3, L4 medial branch and dorsal rami block (100% relief during the anesthetic phase) 10/14/22: bilateral L3, L4 medial branch and dorsal rami block (100% relief during the anesthetic phase) 08/11/23: Bilateral S1 TFESI 08/11/2023 (50% improvement)   Past Surgery: 02/04/20: L2-4 Open Laminectomy and Right L5-S1 Hemilaminectomy by Dr. Bluford Previous back and neck surgery   Jessica Norman has no symptoms of cervical myelopathy.  The symptoms are causing a significant impact on the patient's life.    Review of Systems:  A 10 point review of systems is negative, except for the pertinent positives and negatives detailed in the HPI.  Past Medical History: Past Medical History:  Diagnosis Date   Arthritis    everywhere   Asthma    Bipolar disorder (HCC)    GERD (gastroesophageal reflux disease)    Hearing loss    History of hiatal hernia 2011   treated with medication, no surgical repair   History of Sjogren's disease (HCC)    HLD (hyperlipidemia)    Hypertension    Hypothyroidism    per patient, not taking synthroid  anymore   Incomplete bladder emptying    Memory loss    Obesity    Recurrent UTI    Ringing in ear, bilateral    Vision abnormalities     Past Surgical History: Past Surgical History:  Procedure Laterality Date   ABDOMINAL HYSTERECTOMY     BACK SURGERY     x 3   CARPAL TUNNEL RELEASE Right    COLONOSCOPY     EXCISION OF TONGUE LESION N/A 12/19/2014   Procedure: EXCISION OF TONGUE LESION;  Surgeon: Carolee Hunter, MD;  Location: [facility] ORS;  Service: ENT;  Laterality: N/A;   EYE SURGERY     cataracts   FOOT SURGERY Right    lump on bottom of toe. removed and straightened toes   JOINT REPLACEMENT Left    tkr   LEFT HEART CATH AND CORONARY ANGIOGRAPHY Left 12/29/2022   Procedure: LEFT HEART CATH AND CORONARY ANGIOGRAPHY;  Surgeon: Florencio Cara BIRCH, MD;  Location: [facility] INVASIVE CV LAB;  Service: Cardiovascular;  Laterality: Left;  LUMBAR LAMINECTOMY/DECOMPRESSION MICRODISCECTOMY Right 02/04/2020   Procedure: L2-4 OPEN LAMINECTOMY, RIGHT L5/S1 HEMILAMINECTOMY;  Surgeon: Bluford Standing, MD;  Location: [facility] ORS;  Service: Neurosurgery;  Laterality: Right;   NECK SURGERY     cadaver tissue used for cervical surgery   SHOULDER ARTHROSCOPY WITH OPEN ROTATOR CUFF REPAIR Left 01/16/2015   Procedure: SHOULDER ARTHROSCOPY WITH mini OPEN ROTATOR CUFF REPAIR,subacromial decompression, abrasion chondrop;asty with microfracture of glenoid, excision distal clavicle,  biceps tenodesis, extensive debridement;  Surgeon: Norleen JINNY Maltos, MD;  Location: [facility] ORS;  Service: Orthopedics;  Laterality: Left;   TOTAL KNEE ARTHROPLASTY Left 07/26/2017   Procedure: TOTAL KNEE ARTHROPLASTY;  Surgeon: Maltos Norleen JINNY, MD;  Location: [facility] ORS;  Service: Orthopedics;  Laterality: Left;   TOTAL KNEE ARTHROPLASTY Right 07/01/2020   Procedure: TOTAL KNEE ARTHROPLASTY;  Surgeon: Maltos Norleen JINNY, MD;  Location: [facility] ORS;  Service: Orthopedics;  Laterality: Right;   VESICOVAGINAL FISTULA CLOSURE W/ TAH      Allergies: Allergies as of 09/15/2023 - Review Complete 09/15/2023  Allergen Reaction Noted   Pantoprazole  Itching 01/05/2019   Codeine Itching 06/20/2014   Penicillin g Itching 06/20/2014   Sulfa antibiotics Itching 12/11/2014    Medications: Outpatient Encounter Medications as of 09/15/2023  Medication Sig   amLODipine  (NORVASC ) 10 MG tablet Take 10 mg by mouth daily.   aspirin  EC 81 MG tablet Take 81 mg by mouth daily.   gabapentin (NEURONTIN) 300 MG capsule Take 300 mg by mouth 3 (three) times daily.   albuterol  (VENTOLIN  HFA) 108 (90 Base) MCG/ACT inhaler Inhale 1-2 puffs into the lungs every 6 (six) hours as needed for shortness of breath or wheezing.   carvedilol  (COREG ) 25 MG tablet Take 25 mg by mouth in the morning.   diclofenac Sodium (VOLTAREN) 1 % GEL Apply 1 application topically 4 (four) times daily as needed (pain).   divalproex  (DEPAKOTE  ER) 500 MG 24 hr tablet Take 1,000 mg by mouth at bedtime.   esomeprazole (NEXIUM) 40 MG capsule Take 40 mg by mouth in the morning.   hydrALAZINE  (APRESOLINE ) 25 MG tablet Take 25 mg by mouth in the morning and at bedtime.   hydrochlorothiazide  (HYDRODIURIL ) 25 MG tablet Take 25 mg by mouth daily.   hydroxychloroquine  (PLAQUENIL ) 200 MG tablet Take 200 mg by mouth in the morning and at bedtime.   isosorbide mononitrate (IMDUR) 30 MG 24 hr tablet Take 30 mg by mouth in the morning.   levothyroxine  (SYNTHROID ) 150 MCG tablet  Take 150 mcg by mouth daily before breakfast.   Lifitegrast  (XIIDRA ) 5 % SOLN Place 1 drop into both eyes in the morning and at bedtime. (Patient not taking: Reported on 12/29/2022)   lisinopril  (ZESTRIL ) 20 MG tablet Take 20 mg by mouth in the morning.   loratadine  (CLARITIN ) 10 MG tablet Take 10 mg by mouth in the morning.   lovastatin (MEVACOR) 10 MG tablet Take 10 mg by mouth at bedtime.   meclizine (ANTIVERT) 25 MG tablet Take 12.5-25 mg by mouth every 6 (six) hours as needed for dizziness.   montelukast (SINGULAIR) 10 MG tablet Take 10 mg by mouth at bedtime.   Multiple Vitamin (MULTIVITAMIN WITH MINERALS) TABS tablet Take 1 tablet by mouth in the morning. Women's Multivitamin   naproxen (NAPROSYN) 500 MG tablet Take 1 tablet by mouth 2 (two) times daily as needed.   QUEtiapine  (SEROQUEL ) 25 MG tablet Take 25 mg by mouth 2 (two) times daily.   sertraline  (ZOLOFT ) 100 MG tablet Take 100 mg by  mouth in the morning.   solifenacin (VESICARE) 5 MG tablet Take 5 mg by mouth in the morning.   TURMERIC PO Take 1,000 mg by mouth in the morning.   No facility-administered encounter medications on file as of 09/15/2023.    Social History: Social History   Tobacco Use   Smoking status: Former    Current packs/day: 0.00    Types: Cigarettes    Quit date: 01/13/1965    Years since quitting: 58.7   Smokeless tobacco: Never  Vaping Use   Vaping status: Never Used  Substance Use Topics   Alcohol  use: No    Alcohol /week: 0.0 standard drinks of alcohol     Comment: occ   Drug use: No    Family Medical History: Family History  Problem Relation Age of Onset   Diabetes Mother    Stroke Mother    COPD Father    Tuberculosis Father    Kidney disease Neg Hx    Bladder Cancer Neg Hx     Physical Examination: Vitals:   09/15/23 0905  BP: 136/84    General: Patient is well developed, well nourished, calm, collected, and in no apparent distress. Attention to examination is  appropriate.  Respiratory: Patient is breathing without any difficulty.   NEUROLOGICAL:     Awake, alert, oriented to person, place, and time.  Speech is clear and fluent. Fund of knowledge is appropriate.   Cranial Nerves: Pupils equal round and reactive to light.  Facial tone is symmetric.    Well healed anterior cervical incision and posterior lumbar incision.   No abnormal lesions on exposed skin.   Strength: Side Biceps Triceps Deltoid Interossei Grip Wrist Ext. Wrist Flex.  R 5 5 5 5 5 5 5   L 5 5 5 5 5 5 5    Side Iliopsoas Quads Hamstring PF DF EHL  R 5 5 5 5 5 5   L 5 5 5 5 5 5    Reflexes are 2+ and symmetric at the biceps, brachioradialis, patella and achilles.   Hoffman's is positive on right.  Clonus is not present.   Bilateral upper and lower extremity sensation is intact to light touch.     She has an unsteady gait.   Medical Decision Making  Imaging: MRI lumbar spine dated 08/20/23:  FINDINGS:   BONES AND ALIGNMENT: Mild-to-moderate chronic compression deformity of L1, unchanged in the interim. Mild compression deformity of L3, primarily involving the superior endplate, also unchanged.   SPINAL CORD: The conus medullaris terminates at T12-L1.   SOFT TISSUES: No abnormal contrast enhancement demonstrated.   L1-L2: Mild diffuse disc bulging and bilateral facet arthrosis, causing mild central spinal canal stenosis and bilateral lateral recess stenosis. Mild-to-moderate bilateral facet arthrosis. No significant spinal canal or neural foraminal stenosis.   L2-L3: Status post bilateral laminotomies. Broad-based disc bulge with moderate central spinal canal stenosis and bilateral lateral recess stenosis. Mild bilateral neural foraminal stenosis.   L3-L4: Status post decompression laminectomies. Some broad-based disc bulge with mild-to-moderate bilateral lateral recess stenosis. No apparent nerve root impingement.   L4-L5: Diffuse disc bulging. Status  post left laminotomy. Mild central spinal canal stenosis and bilateral lateral recess stenosis without definite nerve root impingement.   L5-S1: Diffuse disc bulging and bilateral facet arthrosis with mild-to-moderate central spinal canal stenosis and bilateral lateral recess stenosis.   There is no significant adverse change from the prior study.   IMPRESSION: 1. Mild central spinal canal stenosis and bilateral lateral recess stenosis at L4-5 without  definite nerve root impingement. 2. Mild-to-moderate central spinal canal stenosis and bilateral lateral recess stenosis at L5-S1. 3. Mild central spinal canal stenosis and bilateral lateral recess stenosis at T12-L1. 4. Moderate central spinal canal stenosis and bilateral lateral recess stenosis at L2-3 with mild bilateral neural foraminal stenosis. 5. Mild-to-moderate bilateral lateral recess stenosis at L3-4 without apparent nerve root impingement.   Electronically signed by: Jessica Norman 08/20/2023 04:50 PM EDT RP Workstation: [id number]   I have personally reviewed the images and agree with the above interpretation.  Cervical xrays dated 10/04/22:  ACDF C3-C5.   No report available for above xrays.  Assessment and Plan: Ms. Mazurek has a history of lumbar laminectomy L2-L4 on 02/04/20 by Dr. Bluford. She feels like she did not get better after this surgery.   She has intermittent LBP with bilateral posterior leg pain to her feet. She has intermittent numbness/tingling in her legs/feet. She has weakness in her legs as well. Symptoms are worse with standing, walking, and increased activity.   She has known chronic compression fractures L1 and L3. She has diffuse lumbar spondylosis with mild central stenosis L1-L2 and L4-L5 with moderate central stenosis L2-L3 and L5-S1. She has multilevel foraminal stenosis/lateral recess stenosis.   History of cervical fusion and she did well after surgery. She has intermittent pain in her neck.  No arm pain. She has intermittent numbness and tingling in the arms. Drops things sometimes. She has balance issues that are getting worse.   Xrays from 2024 show ACDF C3-C5.   Treatment options discussed with patient and following plan made:   - Recommend she continue with prn injections with PMR. Can also consider PT for lumbar spine.  - She is not interested in any further lumbar surgery.  - MRI of cervical spine to further evaluate gait/balance issues and dexterity issues.  - Will get updated cervical xrays with flex/ext when she gets MRI.  - Will schedule MyChart visit to review MRI results once I get them back.   I spent a total of 40 minutes in face-to-face and non-face-to-face activities related to this patient's care today including review of outside records, review of imaging, review of symptoms, physical exam, discussion of differential diagnosis, discussion of treatment options, and documentation.   Thank you for involving me in the care of this patient.   Glade Boys PA-C Dept. of Neurosurgery

## 2023-09-15 ENCOUNTER — Ambulatory Visit (INDEPENDENT_AMBULATORY_CARE_PROVIDER_SITE_OTHER): Admitting: Orthopedic Surgery

## 2023-09-15 ENCOUNTER — Encounter: Payer: Self-pay | Admitting: Orthopedic Surgery

## 2023-09-15 VITALS — BP 136/84 | Ht 69.0 in | Wt 212.0 lb

## 2023-09-15 DIAGNOSIS — M48061 Spinal stenosis, lumbar region without neurogenic claudication: Secondary | ICD-10-CM | POA: Diagnosis not present

## 2023-09-15 DIAGNOSIS — M4856XD Collapsed vertebra, not elsewhere classified, lumbar region, subsequent encounter for fracture with routine healing: Secondary | ICD-10-CM | POA: Diagnosis not present

## 2023-09-15 DIAGNOSIS — M5416 Radiculopathy, lumbar region: Secondary | ICD-10-CM

## 2023-09-15 DIAGNOSIS — M542 Cervicalgia: Secondary | ICD-10-CM | POA: Diagnosis not present

## 2023-09-15 DIAGNOSIS — R2681 Unsteadiness on feet: Secondary | ICD-10-CM

## 2023-09-15 DIAGNOSIS — M47816 Spondylosis without myelopathy or radiculopathy, lumbar region: Secondary | ICD-10-CM | POA: Diagnosis not present

## 2023-09-15 DIAGNOSIS — R29898 Other symptoms and signs involving the musculoskeletal system: Secondary | ICD-10-CM

## 2023-09-15 DIAGNOSIS — Z9889 Other specified postprocedural states: Secondary | ICD-10-CM

## 2023-09-15 NOTE — Patient Instructions (Addendum)
 It was so nice to see you today. Thank you so much for coming in.    You have some wear and tear in your back. This is likely causing your back and leg pain. I would continue to have injections as needed. Can also consider revisiting PT.   I want to get an MRI of your neck to look into things further. We will get this approved through your insurance and [facility] will call you to schedule the appointment. Ask about your patient responsibility. You do not need to pay this prior to getting MRI, they can bill you.   Remind them to do xrays of your neck when you have the MRI done.   [facility] (building with the white pillars) is located off of [address]. The address is [address], [place], [place] [address].    After you have the MRI and xrays, it can take 14-28 days for me to get the results back. If I don't have them in 2 weeks, we will call to try to get the results.   Once I have the results, we will call you to schedule a follow up MyChart visit with me to review them.   Please do not hesitate to call if you have any questions or concerns. You can also message me in MyChart.   Glade Boys PA-C [phone]     The physicians and staff at [facility] at [facility] are committed to providing excellent care. You may receive a survey asking for feedback about your experience at our office. We value you your feedback and appreciate you taking the time to to fill it out. The [facility] leadership team is also available to discuss your experience in person, feel free to contact us  [phone].

## 2023-09-27 ENCOUNTER — Emergency Department (HOSPITAL_COMMUNITY): Admitting: Anesthesiology

## 2023-09-27 ENCOUNTER — Emergency Department (HOSPITAL_COMMUNITY)

## 2023-09-27 ENCOUNTER — Encounter (HOSPITAL_COMMUNITY)
Admission: EM | Disposition: A | Discharge status: Home Health | Payer: Self-pay | Source: Home / Self Care | Attending: Neurology

## 2023-09-27 ENCOUNTER — Inpatient Hospital Stay (HOSPITAL_COMMUNITY)

## 2023-09-27 ENCOUNTER — Inpatient Hospital Stay (HOSPITAL_COMMUNITY)
Admission: EM | Admit: 2023-09-27 | Discharge: 2023-09-29 | DRG: 024 | Disposition: A | Discharge status: Home Health | Attending: Neurology | Admitting: Neurology

## 2023-09-27 DIAGNOSIS — Z833 Family history of diabetes mellitus: Secondary | ICD-10-CM | POA: Diagnosis not present

## 2023-09-27 DIAGNOSIS — E785 Hyperlipidemia, unspecified: Secondary | ICD-10-CM | POA: Diagnosis present

## 2023-09-27 DIAGNOSIS — Z6832 Body mass index (BMI) 32.0-32.9, adult: Secondary | ICD-10-CM

## 2023-09-27 DIAGNOSIS — I480 Paroxysmal atrial fibrillation: Secondary | ICD-10-CM | POA: Diagnosis present

## 2023-09-27 DIAGNOSIS — I161 Hypertensive emergency: Secondary | ICD-10-CM | POA: Diagnosis present

## 2023-09-27 DIAGNOSIS — E871 Hypo-osmolality and hyponatremia: Secondary | ICD-10-CM | POA: Diagnosis not present

## 2023-09-27 DIAGNOSIS — Z7901 Long term (current) use of anticoagulants: Secondary | ICD-10-CM

## 2023-09-27 DIAGNOSIS — G8929 Other chronic pain: Secondary | ICD-10-CM | POA: Diagnosis present

## 2023-09-27 DIAGNOSIS — I6302 Cerebral infarction due to thrombosis of basilar artery: Secondary | ICD-10-CM

## 2023-09-27 DIAGNOSIS — I651 Occlusion and stenosis of basilar artery: Secondary | ICD-10-CM

## 2023-09-27 DIAGNOSIS — E039 Hypothyroidism, unspecified: Secondary | ICD-10-CM | POA: Diagnosis present

## 2023-09-27 DIAGNOSIS — R079 Chest pain, unspecified: Secondary | ICD-10-CM | POA: Diagnosis not present

## 2023-09-27 DIAGNOSIS — R29701 NIHSS score 1: Secondary | ICD-10-CM | POA: Diagnosis not present

## 2023-09-27 DIAGNOSIS — I63542 Cerebral infarction due to unspecified occlusion or stenosis of left cerebellar artery: Secondary | ICD-10-CM | POA: Diagnosis not present

## 2023-09-27 DIAGNOSIS — E876 Hypokalemia: Secondary | ICD-10-CM

## 2023-09-27 DIAGNOSIS — Z882 Allergy status to sulfonamides status: Secondary | ICD-10-CM

## 2023-09-27 DIAGNOSIS — I63531 Cerebral infarction due to unspecified occlusion or stenosis of right posterior cerebral artery: Secondary | ICD-10-CM | POA: Diagnosis not present

## 2023-09-27 DIAGNOSIS — Z79890 Hormone replacement therapy (postmenopausal): Secondary | ICD-10-CM

## 2023-09-27 DIAGNOSIS — Z87891 Personal history of nicotine dependence: Secondary | ICD-10-CM

## 2023-09-27 DIAGNOSIS — H9190 Unspecified hearing loss, unspecified ear: Secondary | ICD-10-CM | POA: Diagnosis present

## 2023-09-27 DIAGNOSIS — I10 Essential (primary) hypertension: Secondary | ICD-10-CM

## 2023-09-27 DIAGNOSIS — R29727 NIHSS score 27: Secondary | ICD-10-CM | POA: Diagnosis present

## 2023-09-27 DIAGNOSIS — I6389 Other cerebral infarction: Secondary | ICD-10-CM | POA: Diagnosis not present

## 2023-09-27 DIAGNOSIS — E7800 Pure hypercholesterolemia, unspecified: Secondary | ICD-10-CM | POA: Diagnosis not present

## 2023-09-27 DIAGNOSIS — E669 Obesity, unspecified: Secondary | ICD-10-CM | POA: Diagnosis present

## 2023-09-27 DIAGNOSIS — R29810 Facial weakness: Secondary | ICD-10-CM | POA: Diagnosis present

## 2023-09-27 DIAGNOSIS — K219 Gastro-esophageal reflux disease without esophagitis: Secondary | ICD-10-CM

## 2023-09-27 DIAGNOSIS — E870 Hyperosmolality and hypernatremia: Secondary | ICD-10-CM | POA: Diagnosis present

## 2023-09-27 DIAGNOSIS — I6312 Cerebral infarction due to embolism of basilar artery: Principal | ICD-10-CM | POA: Diagnosis present

## 2023-09-27 DIAGNOSIS — Z825 Family history of asthma and other chronic lower respiratory diseases: Secondary | ICD-10-CM

## 2023-09-27 DIAGNOSIS — Z7982 Long term (current) use of aspirin: Secondary | ICD-10-CM

## 2023-09-27 DIAGNOSIS — R1310 Dysphagia, unspecified: Secondary | ICD-10-CM | POA: Diagnosis present

## 2023-09-27 DIAGNOSIS — Z880 Allergy status to penicillin: Secondary | ICD-10-CM

## 2023-09-27 DIAGNOSIS — I2510 Atherosclerotic heart disease of native coronary artery without angina pectoris: Secondary | ICD-10-CM | POA: Diagnosis present

## 2023-09-27 DIAGNOSIS — K760 Fatty (change of) liver, not elsewhere classified: Secondary | ICD-10-CM | POA: Diagnosis present

## 2023-09-27 DIAGNOSIS — F319 Bipolar disorder, unspecified: Secondary | ICD-10-CM | POA: Diagnosis present

## 2023-09-27 DIAGNOSIS — J449 Chronic obstructive pulmonary disease, unspecified: Secondary | ICD-10-CM | POA: Diagnosis present

## 2023-09-27 DIAGNOSIS — Z823 Family history of stroke: Secondary | ICD-10-CM

## 2023-09-27 DIAGNOSIS — Z96653 Presence of artificial knee joint, bilateral: Secondary | ICD-10-CM | POA: Diagnosis present

## 2023-09-27 DIAGNOSIS — I4891 Unspecified atrial fibrillation: Secondary | ICD-10-CM

## 2023-09-27 DIAGNOSIS — Z885 Allergy status to narcotic agent status: Secondary | ICD-10-CM

## 2023-09-27 DIAGNOSIS — Z79899 Other long term (current) drug therapy: Secondary | ICD-10-CM

## 2023-09-27 DIAGNOSIS — M3500 Sicca syndrome, unspecified: Secondary | ICD-10-CM | POA: Diagnosis present

## 2023-09-27 DIAGNOSIS — M159 Polyosteoarthritis, unspecified: Secondary | ICD-10-CM | POA: Diagnosis present

## 2023-09-27 DIAGNOSIS — I160 Hypertensive urgency: Secondary | ICD-10-CM | POA: Diagnosis not present

## 2023-09-27 DIAGNOSIS — I6322 Cerebral infarction due to unspecified occlusion or stenosis of basilar arteries: Secondary | ICD-10-CM | POA: Diagnosis not present

## 2023-09-27 DIAGNOSIS — Z888 Allergy status to other drugs, medicaments and biological substances status: Secondary | ICD-10-CM

## 2023-09-27 DIAGNOSIS — R29700 NIHSS score 0: Secondary | ICD-10-CM | POA: Diagnosis not present

## 2023-09-27 HISTORY — PX: IR ANGIO EXTRACRAN SEL COM CAROTID INNOMINATE UNI R MOD SED: IMG5356

## 2023-09-27 HISTORY — PX: IR PERCUTANEOUS ART THROMBECTOMY/INFUSION INTRACRANIAL INC DIAG ANGIO: IMG6087

## 2023-09-27 HISTORY — PX: RADIOLOGY WITH ANESTHESIA: SHX6223

## 2023-09-27 HISTORY — PX: IR US GUIDE VASC ACCESS RIGHT: IMG2390

## 2023-09-27 LAB — I-STAT CHEM 8, ED
BUN: 8 mg/dL (ref 8–23)
Calcium, Ion: 1.13 mmol/L — ABNORMAL LOW (ref 1.15–1.40)
Chloride: 111 mmol/L (ref 98–111)
Creatinine, Ser: 0.8 mg/dL (ref 0.44–1.00)
Glucose, Bld: 97 mg/dL (ref 70–99)
HCT: 36 % (ref 36.0–46.0)
Hemoglobin: 12.2 g/dL (ref 12.0–15.0)
Potassium: 2.9 mmol/L — ABNORMAL LOW (ref 3.5–5.1)
Sodium: 150 mmol/L — ABNORMAL HIGH (ref 135–145)
TCO2: 22 mmol/L (ref 22–32)

## 2023-09-27 LAB — POCT I-STAT 7, (LYTES, BLD GAS, ICA,H+H)
Acid-Base Excess: 0 mmol/L (ref 0.0–2.0)
Bicarbonate: 25.1 mmol/L (ref 20.0–28.0)
Calcium, Ion: 1.21 mmol/L (ref 1.15–1.40)
HCT: 36 % (ref 36.0–46.0)
Hemoglobin: 12.2 g/dL (ref 12.0–15.0)
O2 Saturation: 100 %
Potassium: 3.6 mmol/L (ref 3.5–5.1)
Sodium: 143 mmol/L (ref 135–145)
TCO2: 26 mmol/L (ref 22–32)
pCO2 arterial: 40.7 mmHg (ref 32–48)
pH, Arterial: 7.397 (ref 7.35–7.45)
pO2, Arterial: 469 mmHg — ABNORMAL HIGH (ref 83–108)

## 2023-09-27 LAB — HEMOGLOBIN A1C
Hgb A1c MFr Bld: 4.9 % (ref 4.8–5.6)
Mean Plasma Glucose: 93.93 mg/dL

## 2023-09-27 LAB — CBC
HCT: 38 % (ref 36.0–46.0)
Hemoglobin: 11.8 g/dL — ABNORMAL LOW (ref 12.0–15.0)
MCH: 26.8 pg (ref 26.0–34.0)
MCHC: 31.1 g/dL (ref 30.0–36.0)
MCV: 86.4 fL (ref 80.0–100.0)
Platelets: 173 K/uL (ref 150–400)
RBC: 4.4 MIL/uL (ref 3.87–5.11)
RDW: 14.7 % (ref 11.5–15.5)
WBC: 10.7 K/uL — ABNORMAL HIGH (ref 4.0–10.5)
nRBC: 0 % (ref 0.0–0.2)

## 2023-09-27 LAB — BASIC METABOLIC PANEL WITH GFR
Anion gap: 9 (ref 5–15)
BUN: 10 mg/dL (ref 8–23)
CO2: 23 mmol/L (ref 22–32)
Calcium: 8.7 mg/dL — ABNORMAL LOW (ref 8.9–10.3)
Chloride: 109 mmol/L (ref 98–111)
Creatinine, Ser: 1.01 mg/dL — ABNORMAL HIGH (ref 0.44–1.00)
GFR, Estimated: 59 mL/min — ABNORMAL LOW (ref >60)
Glucose, Bld: 93 mg/dL (ref 70–99)
Potassium: 4 mmol/L (ref 3.5–5.1)
Sodium: 141 mmol/L (ref 135–145)

## 2023-09-27 LAB — SARS CORONAVIRUS 2 BY RT PCR: SARS Coronavirus 2 by RT PCR: NEGATIVE

## 2023-09-27 SURGERY — RADIOLOGY WITH ANESTHESIA
Anesthesia: General

## 2023-09-27 MED ORDER — PHENYLEPHRINE HCL-NACL 20-0.9 MG/250ML-% IV SOLN
INTRAVENOUS | Status: DC | PRN
  Administered 2023-09-27: 25 ug/min via INTRAVENOUS

## 2023-09-27 MED ORDER — CLEVIDIPINE BUTYRATE 0.5 MG/ML IV EMUL
0.0000 mg/h | INTRAVENOUS | Status: DC
  Administered 2023-09-27: 2 mg/h via INTRAVENOUS

## 2023-09-27 MED ORDER — TENECTEPLASE FOR STROKE
0.2500 mg/kg | PACK | Freq: Once | INTRAVENOUS | Status: AC
  Administered 2023-09-27: 25 mg via INTRAVENOUS
  Filled 2023-09-27: qty 10

## 2023-09-27 MED ORDER — STROKE: EARLY STAGES OF RECOVERY BOOK
Freq: Once | Status: AC
  Filled 2023-09-27: qty 1

## 2023-09-27 MED ORDER — SODIUM CHLORIDE 0.9 % IV SOLN: INTRAVENOUS | Status: DC | PRN

## 2023-09-27 MED ORDER — FENTANYL CITRATE PF 50 MCG/ML IJ SOSY
50.0000 ug | PREFILLED_SYRINGE | INTRAMUSCULAR | Status: DC | PRN
  Administered 2023-09-27: 50 ug via INTRAVENOUS
  Filled 2023-09-27: qty 1

## 2023-09-27 MED ORDER — CLEVIDIPINE BUTYRATE 0.5 MG/ML IV EMUL
0.0000 mg/h | INTRAVENOUS | Status: DC
  Administered 2023-09-27: 12 mg/h via INTRAVENOUS
  Administered 2023-09-27: 10 mg/h via INTRAVENOUS
  Administered 2023-09-27: 8 mg/h via INTRAVENOUS
  Administered 2023-09-28: 12 mg/h via INTRAVENOUS
  Administered 2023-09-28: 10 mg/h via INTRAVENOUS
  Filled 2023-09-27 (×6): qty 50

## 2023-09-27 MED ORDER — LIDOCAINE 2% (20 MG/ML) 5 ML SYRINGE
INTRAMUSCULAR | Status: DC | PRN
Start: 2023-09-27 — End: 2023-09-27
  Administered 2023-09-27: 20 mg via INTRAVENOUS

## 2023-09-27 MED ORDER — ORAL CARE MOUTH RINSE
15.0000 mL | OROMUCOSAL | Status: DC
  Administered 2023-09-27 (×2): 15 mL via OROMUCOSAL

## 2023-09-27 MED ORDER — LEVOTHYROXINE SODIUM 75 MCG PO TABS
150.0000 ug | ORAL_TABLET | Freq: Every day | ORAL | Status: DC
  Administered 2023-09-28: 150 ug
  Filled 2023-09-27: qty 2

## 2023-09-27 MED ORDER — HYDROXYCHLOROQUINE SULFATE 200 MG PO TABS
200.0000 mg | ORAL_TABLET | Freq: Two times a day (BID) | ORAL | Status: DC
  Filled 2023-09-27: qty 1

## 2023-09-27 MED ORDER — DOCUSATE SODIUM 50 MG/5ML PO LIQD
100.0000 mg | Freq: Two times a day (BID) | ORAL | Status: DC
  Administered 2023-09-28 – 2023-09-29 (×3): 100 mg via ORAL
  Filled 2023-09-27 (×4): qty 10

## 2023-09-27 MED ORDER — FAMOTIDINE 20 MG PO TABS
20.0000 mg | ORAL_TABLET | Freq: Two times a day (BID) | ORAL | Status: DC
  Administered 2023-09-27: 20 mg
  Filled 2023-09-27: qty 1

## 2023-09-27 MED ORDER — PROPOFOL 10 MG/ML IV BOLUS
INTRAVENOUS | Status: DC | PRN
  Administered 2023-09-27: 30 mg via INTRAVENOUS
  Administered 2023-09-27: 130 mg via INTRAVENOUS

## 2023-09-27 MED ORDER — IOHEXOL 300 MG/ML  SOLN
150.0000 mL | Freq: Once | INTRAMUSCULAR | Status: AC | PRN
Start: 2023-09-27 — End: 2023-09-27
  Administered 2023-09-27: 50 mL via INTRA_ARTERIAL

## 2023-09-27 MED ORDER — PROPOFOL 500 MG/50ML IV EMUL: INTRAVENOUS | Status: DC | PRN

## 2023-09-27 MED ORDER — ALBUTEROL SULFATE (2.5 MG/3ML) 0.083% IN NEBU: 2.5000 mg | INHALATION_SOLUTION | Freq: Four times a day (QID) | RESPIRATORY_TRACT | Status: DC | PRN

## 2023-09-27 MED ORDER — FAMOTIDINE 20 MG PO TABS
20.0000 mg | ORAL_TABLET | Freq: Two times a day (BID) | ORAL | Status: DC
  Administered 2023-09-27: 20 mg via ORAL
  Filled 2023-09-27 (×2): qty 1

## 2023-09-27 MED ORDER — LABETALOL HCL 5 MG/ML IV SOLN
INTRAVENOUS | Status: DC | PRN
  Administered 2023-09-27: 20 mg via INTRAVENOUS

## 2023-09-27 MED ORDER — CHLORHEXIDINE GLUCONATE CLOTH 2 % EX PADS
6.0000 | MEDICATED_PAD | Freq: Every day | CUTANEOUS | Status: DC
  Administered 2023-09-28: 6 via TOPICAL

## 2023-09-27 MED ORDER — ACETAMINOPHEN 325 MG PO TABS
650.0000 mg | ORAL_TABLET | ORAL | Status: DC | PRN
  Administered 2023-09-27 – 2023-09-29 (×4): 650 mg via ORAL
  Filled 2023-09-27 (×4): qty 2

## 2023-09-27 MED ORDER — ONDANSETRON HCL 4 MG/2ML IJ SOLN
INTRAMUSCULAR | Status: DC | PRN
  Administered 2023-09-27: 4 mg via INTRAVENOUS

## 2023-09-27 MED ORDER — SENNOSIDES-DOCUSATE SODIUM 8.6-50 MG PO TABS: 1.0000 | ORAL_TABLET | Freq: Every evening | ORAL | Status: DC | PRN

## 2023-09-27 MED ORDER — ACETAMINOPHEN 160 MG/5ML PO SOLN: 650.0000 mg | ORAL | Status: DC | PRN

## 2023-09-27 MED ORDER — POLYETHYLENE GLYCOL 3350 17 G PO PACK
17.0000 g | PACK | Freq: Every day | ORAL | Status: DC
  Administered 2023-09-27 – 2023-09-28 (×2): 17 g
  Filled 2023-09-27 (×2): qty 1

## 2023-09-27 MED ORDER — IOHEXOL 350 MG/ML SOLN
75.0000 mL | Freq: Once | INTRAVENOUS | Status: AC | PRN
  Administered 2023-09-27: 75 mL via INTRAVENOUS

## 2023-09-27 MED ORDER — ORAL CARE MOUTH RINSE: 15.0000 mL | OROMUCOSAL | Status: DC | PRN

## 2023-09-27 MED ORDER — ROSUVASTATIN CALCIUM 20 MG PO TABS: 40.0000 mg | ORAL_TABLET | Freq: Every day | ORAL | Status: DC

## 2023-09-27 MED ORDER — EPHEDRINE SULFATE-NACL 50-0.9 MG/10ML-% IV SOSY
PREFILLED_SYRINGE | INTRAVENOUS | Status: DC | PRN
  Administered 2023-09-27 (×2): 5 mg via INTRAVENOUS

## 2023-09-27 MED ORDER — ROCURONIUM BROMIDE 10 MG/ML (PF) SYRINGE
PREFILLED_SYRINGE | INTRAVENOUS | Status: DC | PRN
Start: 2023-09-27 — End: 2023-09-27
  Administered 2023-09-27: 40 mg via INTRAVENOUS

## 2023-09-27 MED ORDER — SUCCINYLCHOLINE CHLORIDE 200 MG/10ML IV SOSY
PREFILLED_SYRINGE | INTRAVENOUS | Status: DC | PRN
Start: 2023-09-27 — End: 2023-09-27
  Administered 2023-09-27: 140 mg via INTRAVENOUS

## 2023-09-27 MED ORDER — ACETAMINOPHEN 650 MG RE SUPP: 650.0000 mg | RECTAL | Status: DC | PRN

## 2023-09-27 MED ORDER — PHENYLEPHRINE 80 MCG/ML (10ML) SYRINGE FOR IV PUSH (FOR BLOOD PRESSURE SUPPORT)
PREFILLED_SYRINGE | INTRAVENOUS | Status: DC | PRN
  Administered 2023-09-27: 160 ug via INTRAVENOUS
  Administered 2023-09-27 (×2): 80 ug via INTRAVENOUS
  Administered 2023-09-27 (×2): 160 ug via INTRAVENOUS

## 2023-09-27 MED ORDER — FENTANYL CITRATE PF 50 MCG/ML IJ SOSY: 50.0000 ug | PREFILLED_SYRINGE | INTRAMUSCULAR | Status: DC | PRN

## 2023-09-27 MED ORDER — SODIUM CHLORIDE 0.9 % IV BOLUS: 250.0000 mL | INTRAVENOUS | Status: AC | PRN

## 2023-09-27 MED ORDER — ONDANSETRON HCL 4 MG/2ML IJ SOLN
4.0000 mg | Freq: Once | INTRAMUSCULAR | Status: AC
  Administered 2023-09-27: 4 mg via INTRAVENOUS

## 2023-09-27 MED ORDER — PROPOFOL 500 MG/50ML IV EMUL
INTRAVENOUS | Status: DC | PRN
  Administered 2023-09-27: 50 ug/kg/min via INTRAVENOUS

## 2023-09-27 MED ORDER — SODIUM CHLORIDE 0.9 % IV SOLN: INTRAVENOUS | Status: AC

## 2023-09-27 MED ORDER — ALBUTEROL SULFATE HFA 108 (90 BASE) MCG/ACT IN AERS: 1.0000 | INHALATION_SPRAY | Freq: Four times a day (QID) | RESPIRATORY_TRACT | Status: DC | PRN

## 2023-09-27 MED ORDER — PROPOFOL 1000 MG/100ML IV EMUL
0.0000 ug/kg/min | INTRAVENOUS | Status: DC
  Administered 2023-09-27: 40 ug/kg/min via INTRAVENOUS
  Filled 2023-09-27: qty 100

## 2023-09-27 MED ORDER — DOCUSATE SODIUM 50 MG/5ML PO LIQD
100.0000 mg | Freq: Two times a day (BID) | ORAL | Status: DC
  Administered 2023-09-27: 100 mg
  Filled 2023-09-27: qty 10

## 2023-09-27 NOTE — H&P (Signed)
 NEUROLOGY H&P NOTE   Date of service: September 27, 2023 Patient Name: Jessica Norman MRN:  [id number] DOB:  1950/09/15 Chief Complaint: Code stroke for left gaze and left-sided weakness  History of Present Illness  Jessica Norman is a 73 y.o. female with hx of GERD, bipolar, hypertension, hyperlipidemia, GERD, hypothyroidism, obesity who presents with aphasia, left gaze deviation, left facial droop, vomiting.  Per EMS, patient was walking to the bathroom and at 10:38 AM, she had sudden onset severe headache and she fell down to the ground.  Her husband called EMS and she was brought in as a code stroke.  I was unable to get in touch with patient's husband to clarify history any further.  I spoke with patient's daughter who was not present with her this a.m. but is aware that the patient was transported to the hospital.  Daughter reports that at baseline, patient is able to perform all ADLs.  CT head without contrast with aspects of 10.  CT head is notable for hyperdense basilar.  CT angio the head and neck demonstrates proximal basilar artery occlusion along with occlusion of the left SCA and occlusion of right vertebral artery V4 segment and significantly diminished dominant left V4 vertebral artery.  Last known well: 10:38 AM Modified rankin score: 0-Completely asymptomatic and back to baseline post- stroke IV Thrombolysis: Yes. I discussed mode of administration, risks and benefits and alternatives to TNKase  with patient's daughter Ms. Ginger Console over phone.  I discussed the inclusion and exclusion criteria for TNKase .  Given the disabling nature of her current deficits, daughter consented to TNKase  over phone.  Daughter understands risk of hemorrhage with TNKase  of about 3 to 5%.  There was some slight delay in TNKase  administration due to significant hypertension.  Patient's blood pressure had to be lowered prior to administering TNKase . Thrombectomy: Yes.  CTA demonstrated  significant posterior circulation deficits including proximal basilar artery occlusion, left vertebral artery V4 segment diminished contrast, right vertebral artery V4 segment occlusion along with occlusion of the left SCA at the origin.  Dr. Todd Burns and I discussed risks and benefits of thrombectomy along with details of the procedure and alternatives.  Given the disabling nature of her deficits and high risk of death if she does not undergo thrombectomy, daughter consented to thrombectomy.  Daughter understands that this is associated with risk of ICH which can worsen her symptoms and can result in death.  NIHSS components Score: Comment  1a Level of Conscious 0[]  1[x]  2[]  3[]      1b LOC Questions 0[]  1[]  2[x]       1c LOC Commands 0[]  1[]  2[x]       2 Best Gaze 0[]  1[]  2[x]       3 Visual 0[]  1[]  2[x]  3[]      4 Facial Palsy 0[]  1[x]  2[]  3[]      5a Motor Arm - left 0[]  1[]  2[]  3[]  4[x]  UN[]    5b Motor Arm - Right 0[]  1[]  2[x]  3[]  4[]  UN[]    6a Motor Leg - Left 0[]  1[]  2[]  3[x]  4[]  UN[]    6b Motor Leg - Right 0[]  1[]  2[]  3[x]  4[]  UN[]    7 Limb Ataxia 0[x]  1[]  2[]  UN[]      8 Sensory 0[x]  1[]  2[]  UN[]      9 Best Language 0[]  1[]  2[]  3[x]      10 Dysarthria 0[]  1[]  2[x]  UN[]      11 Extinct. and Inattention 0[x]  1[]  2[]       TOTAL: 27  ROS  Unable to perform due to aphasia.  Past History   Past Medical History:  Diagnosis Date   Arthritis    everywhere   Asthma    Bipolar disorder (HCC)    GERD (gastroesophageal reflux disease)    Hearing loss    History of hiatal hernia 2011   treated with medication, no surgical repair   History of Sjogren's disease (HCC)    HLD (hyperlipidemia)    Hypertension    Hypothyroidism    per patient, not taking synthroid  anymore   Incomplete bladder emptying    Memory loss    Obesity    Recurrent UTI    Ringing in ear, bilateral    Vision abnormalities    Past Surgical History:  Procedure Laterality Date   ABDOMINAL HYSTERECTOMY     BACK  SURGERY     x 3   CARPAL TUNNEL RELEASE Right    COLONOSCOPY     EXCISION OF TONGUE LESION N/A 12/19/2014   Procedure: EXCISION OF TONGUE LESION;  Surgeon: Carolee Hunter, MD;  Location: [facility] ORS;  Service: ENT;  Laterality: N/A;   EYE SURGERY     cataracts   FOOT SURGERY Right    lump on bottom of toe. removed and straightened toes   JOINT REPLACEMENT Left    tkr   LEFT HEART CATH AND CORONARY ANGIOGRAPHY Left 12/29/2022   Procedure: LEFT HEART CATH AND CORONARY ANGIOGRAPHY;  Surgeon: Florencio Cara BIRCH, MD;  Location: [facility] INVASIVE CV LAB;  Service: Cardiovascular;  Laterality: Left;   LUMBAR LAMINECTOMY/DECOMPRESSION MICRODISCECTOMY Right 02/04/2020   Procedure: L2-4 OPEN LAMINECTOMY, RIGHT L5/S1 HEMILAMINECTOMY;  Surgeon: Bluford Standing, MD;  Location: [facility] ORS;  Service: Neurosurgery;  Laterality: Right;   NECK SURGERY     cadaver tissue used for cervical surgery   SHOULDER ARTHROSCOPY WITH OPEN ROTATOR CUFF REPAIR Left 01/16/2015   Procedure: SHOULDER ARTHROSCOPY WITH mini OPEN ROTATOR CUFF REPAIR,subacromial decompression, abrasion chondrop;asty with microfracture of glenoid, excision distal clavicle, biceps tenodesis, extensive debridement;  Surgeon: Norleen JINNY Maltos, MD;  Location: [facility] OR[facility];  Service: Orthopedics;  Laterality: Left;   TOTAL KNEE ARTHROPLASTY Left 07/26/2017   Procedure: TOTAL KNEE ARTHROPLASTY;  Surgeon: Maltos Norleen JINNY, MD;  Location: [facility] OR[facility];  Service: Orthopedics;  Laterality: Left;   TOTAL KNEE ARTHROPLASTY Right 07/01/2020   Procedure: TOTAL KNEE ARTHROPLASTY;  Surgeon: Maltos Norleen JINNY, MD;  Location: [facility] OR[facility];  Service: Orthopedics;  Laterality: Right;   VESICOVAGINAL FISTULA CLOSURE W/ TAH     Family History  Problem Relation Age of Onset   Diabetes Mother    Stroke Mother    COPD Father    Tuberculosis Father    Kidney disease Neg Hx    Bladder Cancer Neg Hx    Social History   Socioeconomic History   Marital status: Married    Spouse name: Monroe    Number of children: 2   Years of education: college   Highest education level: Not on file  Occupational History   Occupation: Audiological scientist    Comment: retired  Tobacco Use   Smoking status: Former    Current packs/day: 0.00    Types: Cigarettes    Quit date: 01/13/1965    Years since quitting: 58.7   Smokeless tobacco: Never  Vaping Use   Vaping status: Never Used  Substance and Sexual Activity   Alcohol  use: No    Alcohol /week: 0.0 standard drinks of alcohol     Comment: occ   Drug use: No  Sexual activity: Yes  Other Topics Concern   Not on file  Social History Narrative   Caffeine use: 1 soda per day, 1 coffee per day   Right handed    Lives with husband.   Lives on first floor. Feels safe in her home.   Social Drivers of Corporate investment banker Strain: Low Risk  (01/06/2023)   Received from [facility]   Overall Financial Resource Strain (CARDIA)    Difficulty of Paying Living Expenses: Not hard at all  Food Insecurity: No Food Insecurity (01/06/2023)   Received from [facility]   Hunger Vital Sign    Within the past 12 months, you worried that your food would run out before you got the money to buy more.: Never true    Within the past 12 months, the food you bought just didn't last and you didn't have money to get more.: Never true  Transportation Needs: No Transportation Needs (01/06/2023)   Received [facility] - Transportation    In the past 12 months, has lack of transportation kept you from medical appointments or from getting medications?: No    Lack of Transportation (Non-Medical): No  Physical Activity: Not on file  Stress: Not on file  Social Connections: Not on file   Allergies  Allergen Reactions   Pantoprazole  Itching     Throat and chest tightness   Codeine Itching   Penicillin G Itching   Sulfa Antibiotics Itching    Medications   Current Facility-Administered  Medications:    [START ON 09/28/2023]  stroke: early stages of recovery book, , Does not apply, Once, Kamarri Fischetti, MD   0.9 %  sodium chloride  infusion, , Intravenous, Continuous, Tyrica Afzal, MD   acetaminophen  (TYLENOL ) tablet 650 mg, 650 mg, Oral, Q4H PRN **OR** acetaminophen  (TYLENOL ) 160 MG/5ML solution 650 mg, 650 mg, Per Tube, Q4H PRN **OR** acetaminophen  (TYLENOL ) suppository 650 mg, 650 mg, Rectal, Q4H PRN, Azora Bonzo, MD   clevidipine  (CLEVIPREX ) infusion 0.5 mg/mL, 0-21 mg/hr, Intravenous, Continuous, Braeden Kennan, MD, Stopped at 09/27/23 1247   iohexol  (OMNIPAQUE ) 300 MG/ML solution 150 mL, 150 mL, Intra-arterial, Once PRN, Lester Golas, MD   labetalol  (NORMODYNE ) injection, , Intravenous, Code/Trauma/Sedation Med, Tamel Abel, MD, 20 mg at 09/27/23 1218   senna-docusate (Senokot-S) tablet 1 tablet, 1 tablet, Oral, QHS PRN, Kensley Lares, MD  Current Outpatient Medications:    albuterol  (VENTOLIN  HFA) 108 (90 Base) MCG/ACT inhaler, Inhale 1-2 puffs into the lungs every 6 (six) hours as needed for shortness of breath or wheezing., Disp: , Rfl:    aluminum  hydroxide-magnesium  carbonate (GAVISCON) 95-358 MG/15ML SUSP, Take 15 mLs by mouth daily., Disp: , Rfl:    amLODipine  (NORVASC ) 10 MG tablet, Take 10 mg by mouth daily., Disp: , Rfl:    aspirin  EC 81 MG tablet, Take 81 mg by mouth daily., Disp: , Rfl:    carvedilol  (COREG ) 25 MG tablet, Take 25 mg by mouth in the morning., Disp: , Rfl:    cycloSPORINE  (RESTASIS ) 0.05 % ophthalmic emulsion, Place 1 drop into both eyes 2 (two) times daily., Disp: , Rfl:    divalproex  (DEPAKOTE  ER) 500 MG 24 hr tablet, Take 1,000 mg by mouth at bedtime., Disp: , Rfl:    esomeprazole (NEXIUM) 40 MG capsule, Take 40 mg by mouth in the morning., Disp: , Rfl:    fexofenadine (ALLEGRA) 180 MG tablet, Take 180 mg by mouth daily., Disp: , Rfl:  furosemide (LASIX) 20 MG tablet, Take 20 mg by mouth daily as needed for  edema., Disp: , Rfl:    gabapentin (NEURONTIN) 300 MG capsule, Take 300 mg by mouth 3 (three) times daily., Disp: , Rfl:    hydrALAZINE  (APRESOLINE ) 25 MG tablet, Take 25 mg by mouth in the morning and at bedtime., Disp: , Rfl:    hydrochlorothiazide  (HYDRODIURIL ) 25 MG tablet, Take 25 mg by mouth daily., Disp: , Rfl:    hydroxychloroquine  (PLAQUENIL ) 200 MG tablet, Take 200 mg by mouth in the morning and at bedtime., Disp: , Rfl:    isosorbide mononitrate (IMDUR) 30 MG 24 hr tablet, Take 30 mg by mouth in the morning., Disp: , Rfl:    levothyroxine  (SYNTHROID ) 150 MCG tablet, Take 150 mcg by mouth daily before breakfast., Disp: , Rfl:    Lifitegrast  (XIIDRA ) 5 % SOLN, Place 1 drop into both eyes in the morning and at bedtime., Disp: , Rfl:    lisinopril  (ZESTRIL ) 20 MG tablet, Take 20 mg by mouth in the morning., Disp: , Rfl:    lisinopril -hydrochlorothiazide  (ZESTORETIC ) 20-25 MG tablet, Take 1 tablet by mouth daily., Disp: , Rfl:    lovastatin (MEVACOR) 10 MG tablet, Take 10 mg by mouth at bedtime., Disp: , Rfl:    montelukast (SINGULAIR) 10 MG tablet, Take 10 mg by mouth at bedtime., Disp: , Rfl:    Multiple Vitamin (MULTIVITAMIN WITH MINERALS) TABS tablet, Take 1 tablet by mouth in the morning. Women's Multivitamin, Disp: , Rfl:    naproxen (NAPROSYN) 500 MG tablet, Take 1 tablet by mouth 2 (two) times daily as needed., Disp: , Rfl:    pilocarpine (SALAGEN) 5 MG tablet, Take 5 mg by mouth 2 (two) times daily., Disp: , Rfl:    QUEtiapine  (SEROQUEL ) 25 MG tablet, Take 25 mg by mouth 2 (two) times daily., Disp: , Rfl:    rosuvastatin  (CRESTOR ) 40 MG tablet, Take 40 mg by mouth daily., Disp: , Rfl:    sertraline  (ZOLOFT ) 100 MG tablet, Take 100 mg by mouth in the morning., Disp: , Rfl:    VESICARE 5 MG tablet, Take 5 mg by mouth daily., Disp: , Rfl:   Facility-Administered Medications Ordered in Other Encounters:    0.9 %  sodium chloride  infusion, , Intravenous, Continuous PRN, Holtzman, Ariel  Leffew, CRNA, New Bag at 09/27/23 1231   ePHEDrine  sulfate (PF) 5mg /mL syringe, , Intravenous, Anesthesia Intra-op, Holtzman, Ariel Leffew, CRNA, 5 mg at 09/27/23 1256   lidocaine  2% (20 mg/mL) 5 mL syringe, , Intravenous, Anesthesia Intra-op, Holtzman, Ariel Leffew, CRNA, 20 mg at 09/27/23 1239   phenylephrine  (NEO-SYNEPHRINE) 20mg /NS 250mL premix infusion, , Intravenous, Continuous PRN, Holtzman, Ariel Leffew, CRNA, Last Rate: 37.5 mL/hr at 09/27/23 1254, 50 mcg/min at 09/27/23 1254   PHENYLephrine  80 mcg/ml in normal saline Adult IV Push Syringe (For Blood Pressure Support), , Intravenous, Anesthesia Intra-op, Holtzman, Ariel Leffew, CRNA, 160 mcg at 09/27/23 1256   propofol  (DIPRIVAN ) 10 mg/mL bolus/IV push, , Intravenous, Anesthesia Intra-op, Holtzman, Ariel Leffew, CRNA, 130 mg at 09/27/23 1239   rocuronium  (ZEMURON ) injection, , Intravenous, Anesthesia Intra-op, Holtzman, Ariel Leffew, CRNA, 40 mg at 09/27/23 1243   succinylcholine  (ANECTINE ) syringe, , Intravenous, Anesthesia Intra-op, Holtzman, Ariel Leffew, CRNA, 140 mg at 09/27/23 1239   Vitals   Vitals:   09/27/23 1200  Weight: 99 kg  Height: 5' 9 (1.753 m)     Body mass index is 32.23 kg/m.  Physical Exam   General: Laying comfortably in bed;  in no acute distress.  HENT: Normal oropharynx and mucosa. Normal external appearance of ears and nose.  Neck: Supple, no pain or tenderness  CV: No JVD. No peripheral edema.  Pulmonary: Symmetric Chest rise. Normal respiratory effort.  Abdomen: Soft to touch, non-tender.  Ext: No cyanosis, edema, or deformity  Skin: No rash. Normal palpation of skin.   Musculoskeletal: Normal digits and nails by inspection. No clubbing.   Neurologic Examination  Mental status/Cognition: Alert, does not make eye contact.  Does not answer any orientation questions.   Speech/language: Mute, no speech output.  Does not follow commands.   Cranial nerves:   CN II Pupils equal and reactive to light,  does not blink to threat   CN III,IV,VI Left gaze deviation, does not cross midline   CN V Difficult to evaluate.   CN VII Left facial droop.   CN VIII Does not make eye contact or turn head to her speech.   CN IX & X Barely protecting her airway.   CN XI Head is slightly turned to the left   CN XII Does not protrude tongue on command.   Sensory/motor:  Muscle bulk: Normal, tone flaccid in the left upper extremity. No movement in left upper extremity to proximal pinch. Some movement in the right upper extremity to proximal pinch. Some movement in bilateral lower extremities to proximal pinch.  Coordination/Complex Motor:  Unable to assess. Labs   CBC:  Recent Labs  Lab 09/27/23 1210  HGB 12.2  HCT 36.0   Basic Metabolic Panel:  Lab Results  Component Value Date   NA 150 (H) 09/27/2023   K 2.9 (L) 09/27/2023   CO2 25 04/26/2023   GLUCOSE 97 09/27/2023   BUN 8 09/27/2023   CREATININE 0.80 09/27/2023   CALCIUM  9.0 04/26/2023   GFRNONAA >60 04/26/2023   GFRAA >60 12/27/2018   Lipid Panel: No results found for: LDLCALC HgbA1c: No results found for: HGBA1C Urine Drug Screen: No results found for: LABOPIA, COCAINSCRNUR, LABBENZ, AMPHETMU, THCU, LABBARB  Alcohol  Level No results found for: [facility] INR  Lab Results  Component Value Date   INR 1.0 01/31/2020   APTT  Lab Results  Component Value Date   APTT 26 01/31/2020     CT Head without contrast(Personally reviewed): CTH was negative for a large hypodensity concerning for a large territory infarct or hyperdensity concerning for an ICH.  Hyperdense basilar.  CT angio Head and Neck with contrast(Personally reviewed): 1. 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 2. 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 3. 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 The right superior cerebellar artery appears patent  but significantly diminished in caliber. 4. 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 Tapering and significantly diminished contrast within the distal left v4 segment. 5. Mild-to-moderate atherosclerosis of the visualized aortic arch involving the origin of the left subclavian artery. 6. Findings discussed with Dr. Vanessa at 12:20PM on 09/27/23.  MRI Brain(Personally reviewed): Pending  Assessment   VERDA MEHTA is a 73 y.o. female with hx of GERD, bipolar, hypertension, hyperlipidemia, GERD, hypothyroidism, obesity who presents with aphasia, left gaze deviation, left facial droop, vomiting.  She was walking to the bathroom and essentially collapsed while with her husband.  She was brought in as a code stroke and her CT head was negative  for ICH or large territory infarct.  TNKase  was discussed with patient's daughter over the phone and she consented the patient was given TNKase .  CT angio of the head and neck demonstrated proximal basilar artery occlusion along with right vertebral artery V4 occlusion and significantly diminished left vertebral artery V4 segment.  Given the disabling nature of her symptoms, daughter consented to thrombectomy.  Dr. Lester and I discussed in detail the risks and benefits of thrombectomy along with details of the procedure and alternatives.  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   Secondary Diagnosis: Hypertension Emergency (SBP > 180 or DBP > 120 & end organ damage)  Recommendations  Basilar artery thrombosis status post TNKase  and going to thrombectomy: Plan: - Frequent NeuroChecks for post tPA care per stroke unit protocol: - Initial CTH demonstrated no acute hemorrhage or mass - MRI Brain -pending - CTA -basilar artery occlusion, right vert V4 occlusion, left vert V4 severely diminished. - TTE -pending - Lipid Panel: LDL -pending  - Statin: If LDL  is greater than 70. - HbA1c: Pending - Antithrombotic: Start ASA 81 mg daily if 24 h CTH does not show acute hemorrhage. - DVT prophylaxis: SCDs. Pharmacologic prophylaxis if 24 h CTH does not demonstrate acute hemorrhage - Systolic Blood Pressure goal: Per neuro IR for the first 24 hours after thrombectomy but systolic has to be kept below 819 mm Hg - Telemetry monitoring for arrhythmia: 72 hours - Swallow screen - ordered - PT/OT/SLP consults  Hypothyroidism: Continue home Synthroid .  Hypertension: Hold home antihypertensives for now.  Will use as needed labetalol  and Cleviprex  for SBP above goal.  Hyperlipidemia: Continue home Crestor .  This patient is critically ill and at significant risk of neurological worsening, death and care requires constant monitoring of vital signs, hemodynamics,respiratory and cardiac monitoring, neurological assessment, discussion with family, other specialists and medical decision making of high complexity. I spent 75 minutes of neurocritical care time  in the care of  this patient. This was time spent independent of any time provided by nurse practitioner or PA.   CODE STATUS was verified with patient's daughter and patient is full code.  Allergies were verified with patient's daughter and updated in the chart.  Alazar Cherian [facility] 09/27/2023  1:19 PM  ______________________________________________________________________   Signed, Ayyub Krall, MD [facility]  Risks, benefits and alternatives of IVT discussed with patient and/or family and they agreed. CTH personally reviewed prior to TNK administration.

## 2023-09-27 NOTE — Brief Op Note (Signed)
  NEUROSURGERY BRIEF THROMBECTOMY NOTE   PREOP DX: Basilar artery occlusion  POSTOP DX: Same  PROCEDURE: Basilar artery embolectomy  SURGEON: Nancyann LULLA Burns   ANESTHESIA: GETA  EBL: Minimal  Number of Passes: 2  Technique: ASPIRATION  Final TICI score: 3  Post OP blood pressure goal: SBP<140  Arterial Angioplasty or Stent: No   Anti-Platelet Therapy: No   COMPLICATIONS: No   CONDITION: Stable to recovery  FINDINGS (Full report in [facility]): 1.  Basilar artery embolus removed with embolectomy   Nancyann LULLA Burns  @today @ 1:14 PM

## 2023-09-27 NOTE — Progress Notes (Signed)
 RT pulled back ETT from 26 to 22 @ the lip per CCM. VSS with bilateral breath sounds.

## 2023-09-27 NOTE — Progress Notes (Signed)
 PCCM Update:  Patient tolerated sedation wean and SBT on 5/5. Extubation order placed.  Dorn Chill, MD [facility] Office: [phone]   See Amion for personal pager PCCM on call pager [phone] until 7pm. Please call Elink 7p-7a. [phone]

## 2023-09-27 NOTE — Anesthesia Preprocedure Evaluation (Addendum)
 Anesthesia Evaluation  Patient identified by MRN, date of birth, ID band Patient unresponsive    Reviewed: Patient's Chart, lab work & pertinent test results, Unable to perform ROS - Chart review onlyPreop documentation limited or incomplete due to emergent nature of procedure.  Airway Mallampati: Unable to assess  TM Distance: >3 FB     Dental  (+) Missing   Pulmonary COPD,  COPD inhaler, former smoker   breath sounds clear to auscultation       Cardiovascular hypertension, Pt. on medications and Pt. 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   Ost LM to Prox LAD lesion is 25% stenosed.   Ost LM lesion is 25% stenosed.   The left ventricular systolic function is normal.   LV end diastolic pressure is normal.   The left ventricular ejection fraction is 55-65% by visual estimate.  12/2022 ECHO:  NORMAL LEFT VENTRICULAR SYSTOLIC FUNCTION WITH MODERATE LVH  ESTIMATED EF: >55%  NORMAL LA PRESSURES WITH NORMAL DIASTOLIC FUNCTION  NORMAL RIGHT VENTRICULAR SYSTOLIC FUNCTION  VALVULAR REGURGITATION: No AR, MILD MR, MILD PR, MODERATE TR  NO VALVULAR STENOSIS     Neuro/Psych  Headaches    Bipolar Disorder   1038 had sudden onset severe headache and collapsed to the ground.  CVA (acute CVA)    GI/Hepatic hiatal hernia,GERD  Medicated,,  Endo/Other  Hypothyroidism    Renal/GU      Musculoskeletal   Abdominal   Peds  Hematology Hb 12.2   Anesthesia Other Findings   Reproductive/Obstetrics                              Anesthesia Physical Anesthesia Plan  ASA: 4 and emergent  Anesthesia Plan: General   Post-op Pain Management: Minimal or no pain anticipated   Induction: Intravenous and Rapid sequence  PONV Risk Score and Plan: 3 and Treatment may vary due to  age or medical condition  Airway Management Planned: Oral ETT  Additional Equipment: None  Intra-op Plan:   Post-operative Plan: Post-operative intubation/ventilation  Informed Consent:      Only emergency history available  Plan Discussed with: CRNA and Surgeon  Anesthesia Plan Comments: (Emergent, pt non-responsive)        Anesthesia Quick Evaluation

## 2023-09-27 NOTE — Progress Notes (Signed)
 PHARMACIST CODE STROKE RESPONSE  Notified to mix TNK at 12:17 by Sal TNK preparation completed at 12:18  TNK dose = 25 mg IV over 5 seconds  Issues/delays encountered (if applicable): BP elevated and issues with BP cuff taking accurate reading  Jessica Norman Jessica Norman 09/27/23 12:12 PM

## 2023-09-27 NOTE — Consult Note (Addendum)
 NAME:  Jessica Norman, MRN:  [id number], DOB:  [birth year], LOS: 0 ADMISSION DATE:  09/27/2023, CONSULTATION DATE:  09/27/23 REFERRING MD:  Sal, CHIEF COMPLAINT:  HA    History of Present Illness:  73 yo F PMH bipolar disorder, HTN HLD GERD Hypothyroidism who presented w HA L gaze deviation L facial droop and vomiting. Was walking to bathroom at 1038am 9/9 and had sudden onset sev HA and fell to ground, prompting husband to call EMS.  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  She received tnkase  in ED Was subsequently taken for basilar artery embolectomy  Remains intubated after case and admitted to ICU  PCCM consulted in this setting    Pertinent  Medical History  Bipoalr disorder HLD HTN GERD Obesity Hypothryoidism  Sjogrens  Fatty liver  Prior L2-L4 laminectomy   Significant Hospital Events: Including procedures, antibiotic start and stop dates in addition to other pertinent events   9/9 basilar artery occlusion. Tnkase  then thrombectomy. Remains intubated after case, ICU after. PCCM consult   Interim History / Subjective:  iSTAT ED labs reviewed + VS + imaging + notes   POD0 basilar artery embolectomy, intubated and now in ICU   Objective    Blood pressure (!) 131/119, pulse (!) 101, resp. rate 18, height 5' 9 (1.753 m), weight 99 kg, SpO2 97%.    Vent Mode: PRVC FiO2 (%):  [40 %-100 %] 100 % Set Rate:  [18 bmp] 18 bmp Vt Set:  [520 mL] 520 mL PEEP:  [5 cmH20] 5 cmH20   Intake/Output Summary (Last 24 hours) at 09/27/2023 1420 Last data filed at 09/27/2023 1351 Gross per 24 hour  Intake 500 ml  Output --  Net 500 ml   Filed Weights   09/27/23 1200  Weight: 99 kg    Examination: General: chronically and acutely ill F intubated sedated NAD  HENT: ETT secure thin oral secretions  Lungs: mechanically ventilated  Cardiovascular: tachycardic s1s2 Abdomen: soft ndnt  Extremities: no obvious acute joint deformity   Neuro: Prop paused -- moving BUE BLE extremities purposefully and opens eyes to stimulation, isnt following commands for me but is still sedate  GU: no foley  Resolved problem list   Assessment and Plan   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 Will close loop with RN w answer.  Clevi available for goal & I will make sure the orders reflect intended goal.  -ECHO, lipid panel  -PT OT SLP when appropriate  -MRI timing per neuro  -close neuro checks -- my hope is with decreasing sedation she may follow commands. Certainly on my exam is much improved sounding vs pre-procedure.   Endotracheally intubated Malpositioned ETT  Hx asthma  P -WUA/SBT in AM -CXR, ABG -VAP, pulm hygiene  -RASS 0/-1.  prop+ PRN fent  -PRN albuterol  -d/w RT, pull back ETT 4cm   Afib, presumed new onset  HTN HLD  P -rate is < 100, so not adding rate control agent but fib may very well be etiology  -will get an EKG so there is capture of the fib we are seeing.  -as above,. Clarifying SBP goal. Will use clevi if needed and likely add back enteral agents 9/10  -statin   Hypothyroidism -synthroid    Hypokalemia Hypernatremia -iSTAT labs in ED P -send for serum BMP, CBC   Sjogrens -  home plaquenil    GERD Dysphagia  -as above, SLP when appropriate. Her baseline dysphagia sounds like it is r/t moisture related  -pepcid    Bipolar disorder Chronic pain  P -holding gabapentin, zoloft , seroquel , VPA -- pending med rec   Labs   CBC: Recent Labs  Lab 09/27/23 1210  HGB 12.2  HCT 36.0    Basic Metabolic Panel: Recent Labs  Lab 09/27/23 1210  NA 150*  K 2.9*  CL 111  GLUCOSE 97  BUN 8  CREATININE 0.80   GFR: Estimated Creatinine Clearance: 78.4 mL/min (by C-G formula based on SCr of 0.8 mg/dL). No results for input(s): PROCALCITON, WBC, LATICACIDVEN in the  last 168 hours.  Liver Function Tests: No results for input(s): AST, ALT, ALKPHOS, BILITOT, PROT, ALBUMIN in the last 168 hours. No results for input(s): LIPASE, AMYLASE in the last 168 hours. No results for input(s): AMMONIA in the last 168 hours.  ABG    Component Value Date/Time   TCO2 22 09/27/2023 1210     Coagulation Profile: No results for input(s): INR, PROTIME in the last 168 hours.  Cardiac Enzymes: No results for input(s): CKTOTAL, CKMB, CKMBINDEX, TROPONINI in the last 168 hours.  HbA1C: No results found for: HGBA1C  CBG: No results for input(s): GLUCAP in the last 168 hours.  Review of Systems:   Unable to obtain, intubated sedate   Past Medical History:  She,  has a past medical history of Arthritis, Asthma, Bipolar disorder (HCC), GERD (gastroesophageal reflux disease), Hearing loss, History of hiatal hernia (2011), History of Sjogren's disease (HCC), HLD (hyperlipidemia), Hypertension, Hypothyroidism, Incomplete bladder emptying, Memory loss, Obesity, Recurrent UTI, Ringing in ear, bilateral, and Vision abnormalities.   Surgical History:   Past Surgical History:  Procedure Laterality Date   ABDOMINAL HYSTERECTOMY     BACK SURGERY     x 3   CARPAL TUNNEL RELEASE Right    COLONOSCOPY     EXCISION OF TONGUE LESION N/A 12/19/2014   Procedure: EXCISION OF TONGUE LESION;  Surgeon: Carolee Hunter, MD;  Location: [facility];  Service: ENT;  Laterality: N/A;   EYE SURGERY     cataracts   FOOT SURGERY Right    lump on bottom of toe. removed and straightened toes   JOINT REPLACEMENT Left    tkr   LEFT HEART CATH AND CORONARY ANGIOGRAPHY Left 12/29/2022   Procedure: LEFT HEART CATH AND CORONARY ANGIOGRAPHY;  Surgeon: Florencio Cara BIRCH, MD;  Location: [facility];  Service: Cardiovascular;  Laterality: Left;   LUMBAR LAMINECTOMY/DECOMPRESSION MICRODISCECTOMY Right 02/04/2020   Procedure: L2-4 OPEN LAMINECTOMY, RIGHT  L5/S1 HEMILAMINECTOMY;  Surgeon: Bluford Standing, MD;  Location: [facility];  Service: Neurosurgery;  Laterality: Right;   NECK SURGERY     cadaver tissue used for cervical surgery   SHOULDER ARTHROSCOPY WITH OPEN ROTATOR CUFF REPAIR Left 01/16/2015   Procedure: SHOULDER ARTHROSCOPY WITH mini OPEN ROTATOR CUFF REPAIR,subacromial decompression, abrasion chondrop;asty with microfracture of glenoid, excision distal clavicle, biceps tenodesis, extensive debridement;  Surgeon: Norleen JINNY Maltos, MD;  Location: [facility];  Service: Orthopedics;  Laterality: Left;   TOTAL KNEE ARTHROPLASTY Left 07/26/2017   Procedure: TOTAL KNEE ARTHROPLASTY;  Surgeon: Maltos Norleen JINNY, MD;  Location: [facility];  Service: Orthopedics;  Laterality: Left;   TOTAL KNEE ARTHROPLASTY Right 07/01/2020   Procedure: TOTAL KNEE ARTHROPLASTY;  Surgeon: Maltos Norleen JINNY, MD;  Location: [facility];  Service: Orthopedics;  Laterality: Right;   VESICOVAGINAL FISTULA CLOSURE W/ TAH  Social History:   reports that she quit smoking about 58 years ago. Her smoking use included cigarettes. She has never used smokeless tobacco. She reports that she does not drink alcohol  and does not use drugs.   Family History:  Her family history includes COPD in her father; Diabetes in her mother; Stroke in her mother; Tuberculosis in her father. There is no history of Kidney disease or Bladder Cancer.   Allergies Allergies  Allergen Reactions   Pantoprazole  Itching     Throat and chest tightness   Codeine Itching   Penicillin G Itching   Sulfa Antibiotics Itching     Home Medications  Prior to Admission medications   Medication Sig Start Date End Date Taking? Authorizing Provider  albuterol  (VENTOLIN  HFA) 108 (90 Base) MCG/ACT inhaler Inhale 1-2 puffs into the lungs every 6 (six) hours as needed for shortness of breath or wheezing. 07/14/22   [provider]  aluminum  hydroxide-magnesium  carbonate (GAVISCON) 95-358 MG/15ML SUSP Take 15 mLs by  mouth daily.    [provider]  amLODipine  (NORVASC ) 10 MG tablet Take 10 mg by mouth daily. 04/29/23   [provider]  aspirin  EC 81 MG tablet Take 81 mg by mouth daily. 01/07/23 01/07/24  [provider]  carvedilol  (COREG ) 25 MG tablet Take 25 mg by mouth in the morning.    [provider]  cycloSPORINE  (RESTASIS ) 0.05 % ophthalmic emulsion Place 1 drop into both eyes 2 (two) times daily.    [provider]  divalproex  (DEPAKOTE  ER) 500 MG 24 hr tablet Take 1,000 mg by mouth at bedtime.    [provider]  esomeprazole (NEXIUM) 40 MG capsule Take 40 mg by mouth in the morning.    [provider]  fexofenadine (ALLEGRA) 180 MG tablet Take 180 mg by mouth daily.    [provider]  furosemide (LASIX) 20 MG tablet Take 20 mg by mouth daily as needed for edema.    [provider]  gabapentin (NEURONTIN) 300 MG capsule Take 300 mg by mouth 3 (three) times daily. 08/03/23   [provider]  hydrALAZINE  (APRESOLINE ) 25 MG tablet Take 25 mg by mouth in the morning and at bedtime.    [provider]  hydrochlorothiazide  (HYDRODIURIL ) 25 MG tablet Take 25 mg by mouth daily.    [provider]  hydroxychloroquine  (PLAQUENIL ) 200 MG tablet Take 200 mg by mouth in the morning and at bedtime.    [provider]  isosorbide mononitrate (IMDUR) 30 MG 24 hr tablet Take 30 mg by mouth in the morning.    [provider]  levothyroxine  (SYNTHROID ) 150 MCG tablet Take 150 mcg by mouth daily before breakfast.    [provider]  Lifitegrast  (XIIDRA ) 5 % SOLN Place 1 drop into both eyes in the morning and at bedtime.    [provider]  lisinopril  (ZESTRIL ) 20 MG tablet Take 20 mg by mouth in the morning.    [provider]  lisinopril -hydrochlorothiazide  (ZESTORETIC ) 20-25 MG tablet Take 1 tablet by mouth daily. 09/20/23   [provider]  lovastatin (MEVACOR)  10 MG tablet Take 10 mg by mouth at bedtime. 09/20/23   [provider]  montelukast (SINGULAIR) 10 MG tablet Take 10 mg by mouth at bedtime.    [provider]  Multiple Vitamin (MULTIVITAMIN WITH MINERALS) TABS tablet Take 1 tablet by mouth in the morning. Women's Multivitamin    [provider]  naproxen (NAPROSYN) 500 MG  tablet Take 1 tablet by mouth 2 (two) times daily as needed. 11/09/22   [provider]  pilocarpine (SALAGEN) 5 MG tablet Take 5 mg by mouth 2 (two) times daily.    [provider]  QUEtiapine  (SEROQUEL ) 25 MG tablet Take 25 mg by mouth 2 (two) times daily.    [provider]  rosuvastatin  (CRESTOR ) 40 MG tablet Take 40 mg by mouth daily.    [provider]  sertraline  (ZOLOFT ) 100 MG tablet Take 100 mg by mouth in the morning.    [provider]  VESICARE 5 MG tablet Take 5 mg by mouth daily. 09/20/23   [provider]     Critical care time: 44 min     CRITICAL CARE Performed by: Ronnald FORBES Gave   Total critical care time: 44 minutes  Critical care time was exclusive of separately billable procedures and treating other patients. Critical care was necessary to treat or prevent imminent or life-threatening deterioration.  Critical care was time spent personally by me on the following activities: development of treatment plan with patient and/or surrogate as well as nursing, discussions with consultants, evaluation of patient's response to treatment, examination of patient, obtaining history from patient or surrogate, ordering and performing treatments and interventions, ordering and review of laboratory studies, ordering and review of radiographic studies, pulse oximetry and re-evaluation of patient's condition.  Ronnald Gave MSN, AGACNP-BC [facility] Amion for pager  09/27/2023, 3:18 PM

## 2023-09-27 NOTE — Progress Notes (Signed)
 She is sedated on propofol  Opens eyes. Eyes track to both sides. Wrinkles forehead to command. Follows commands anti-gravity all four extremities. Able to lift head from bed. Puncture looks good.  Seems to be recovering very well. We can get a better exam once extubated.

## 2023-09-27 NOTE — Anesthesia Procedure Notes (Addendum)
 Procedure Name: Intubation Date/Time: 09/27/2023 12:40 PM  Performed by: Carolee Lauraine DASEN, CRNAPre-anesthesia Checklist: Patient identified, Emergency Drugs available, Suction available and Patient being monitored Patient Re-evaluated:Patient Re-evaluated prior to induction Oxygen Delivery Method: Circle System Utilized Preoxygenation: Pre-oxygenation with 100% oxygen Induction Type: IV induction, Rapid sequence and Cricoid Pressure applied Laryngoscope Size: Miller and 2 Grade View: Grade I Tube type: Oral Tube size: 7.5 mm Number of attempts: 1 Airway Equipment and Method: Stylet and Oral airway Placement Confirmation: ETT inserted through vocal cords under direct vision, positive ETCO2 and breath sounds checked- equal and bilateral Secured at: 23 cm Tube secured with: Tape Dental Injury: Teeth and Oropharynx as per pre-operative assessment

## 2023-09-27 NOTE — ED Provider Notes (Signed)
 [facility] NEURO/TRAUMA/SURGICAL ICU Provider Note   CSN: [id number] Arrival date & time: 09/27/23  1209  An emergency department physician performed an initial assessment on this suspected stroke patient at 1210.  History  Chief Complaint  Patient presents with   Code Stroke    Jessica Norman is a 73 y.o. female with GERD, bipolar, hypertension, hyperlipidemia, GERD, hypothyroidism, obesity who presents brought in by EMS as a code stroke. Presents w/ aphasia, L gaze deviation, L facial droop, and nausea/vomiting. Patient was walking to bathroom at 1038 AM when she had sudden onset severe headache and fell to the ground. Husband called EMS where they noted the above sxs. Patient had one episode of vomiting while with EMS now s/p 4 mg IV zofran , no other meds given by EMS.   Patient found to have L gaze deviation, total aphasia, L sided weakness on arrival. Level 5 caveat.    Past Medical History:  Diagnosis Date   Arthritis    everywhere   Asthma    Bipolar disorder (HCC)    GERD (gastroesophageal reflux disease)    Hearing loss    History of hiatal hernia 2011   treated with medication, no surgical repair   History of Sjogren's disease (HCC)    HLD (hyperlipidemia)    Hypertension    Hypothyroidism    per patient, not taking synthroid  anymore   Incomplete bladder emptying    Memory loss    Obesity    Recurrent UTI    Ringing in ear, bilateral    Vision abnormalities        Home Medications Prior to Admission medications   Medication Sig Start Date End Date Taking? Authorizing Provider  albuterol  (VENTOLIN  HFA) 108 (90 Base) MCG/ACT inhaler Inhale 1-2 puffs into the lungs every 6 (six) hours as needed for shortness of breath or wheezing. 07/14/22   [provider]  aluminum  hydroxide-magnesium  carbonate (GAVISCON) 95-358 MG/15ML SUSP Take 15 mLs by mouth daily.    [provider]  amLODipine  (NORVASC ) 10 MG tablet Take 10 mg by mouth daily. 04/29/23    [provider]  aspirin  EC 81 MG tablet Take 81 mg by mouth daily. 01/07/23 01/07/24  [provider]  carvedilol  (COREG ) 25 MG tablet Take 25 mg by mouth in the morning.    [provider]  cycloSPORINE  (RESTASIS ) 0.05 % ophthalmic emulsion Place 1 drop into both eyes 2 (two) times daily.    [provider]  divalproex  (DEPAKOTE  ER) 500 MG 24 hr tablet Take 1,000 mg by mouth at bedtime.    [provider]  esomeprazole (NEXIUM) 40 MG capsule Take 40 mg by mouth in the morning.    [provider]  fexofenadine (ALLEGRA) 180 MG tablet Take 180 mg by mouth daily.    [provider]  furosemide (LASIX) 20 MG tablet Take 20 mg by mouth daily as needed for edema.    [provider]  gabapentin (NEURONTIN) 300 MG capsule Take 300 mg by mouth 3 (three) times daily. 08/03/23   [provider]  hydrALAZINE  (APRESOLINE ) 25 MG tablet Take 25 mg by mouth in the morning and at bedtime.    [provider]  hydrochlorothiazide  (HYDRODIURIL ) 25 MG tablet Take 25 mg by mouth daily.    [provider]  hydroxychloroquine  (PLAQUENIL ) 200 MG tablet Take 200 mg by mouth in the morning and at bedtime.    [provider]  isosorbide mononitrate (IMDUR) 30 MG 24 hr  tablet Take 30 mg by mouth in the morning.    [provider]  levothyroxine  (SYNTHROID ) 150 MCG tablet Take 150 mcg by mouth daily before breakfast.    [provider]  Lifitegrast  (XIIDRA ) 5 % SOLN Place 1 drop into both eyes in the morning and at bedtime.    [provider]  lisinopril  (ZESTRIL ) 20 MG tablet Take 20 mg by mouth in the morning.    [provider]  lisinopril -hydrochlorothiazide  (ZESTORETIC ) 20-25 MG tablet Take 1 tablet by mouth daily. 09/20/23   [provider]  lovastatin (MEVACOR) 10 MG tablet Take 10 mg by mouth at bedtime. 09/20/23   [provider]  montelukast (SINGULAIR) 10 MG  tablet Take 10 mg by mouth at bedtime.    [provider]  Multiple Vitamin (MULTIVITAMIN WITH MINERALS) TABS tablet Take 1 tablet by mouth in the morning. Women's Multivitamin    [provider]  naproxen (NAPROSYN) 500 MG tablet Take 1 tablet by mouth 2 (two) times daily as needed. 11/09/22   [provider]  pilocarpine (SALAGEN) 5 MG tablet Take 5 mg by mouth 2 (two) times daily.    [provider]  QUEtiapine  (SEROQUEL ) 25 MG tablet Take 25 mg by mouth 2 (two) times daily.    [provider]  rosuvastatin  (CRESTOR ) 40 MG tablet Take 40 mg by mouth daily.    [provider]  sertraline  (ZOLOFT ) 100 MG tablet Take 100 mg by mouth in the morning.    [provider]  VESICARE 5 MG tablet Take 5 mg by mouth daily. 09/20/23   [provider]      Allergies    Pantoprazole , Codeine, Penicillin g, and Sulfa antibiotics    Review of Systems   Review of Systems A 10 point review of systems was performed and is negative unless otherwise reported in HPI.  Physical Exam Updated Vital Signs BP 136/70   Pulse 97   Resp 18   Ht 5' 9 (1.753 m)   Wt 99 kg   SpO2 97%   BMI 32.23 kg/m  Physical Exam General: Acutely ill appearing female, lying in bed.  HEENT: PERRLA, forced left gaze deviation, sclera anicteric, MMM, trachea midline.  Cardiology: RRR, no murmurs/rubs/gallops.  Resp: Normal respiratory rate and effort. CTAB, no wheezes, rhonchi, crackles.  Abd: Soft, non-tender, non-distended. No rebound tenderness or guarding.  GU: Deferred. MSK: No peripheral edema or signs of trauma. Extremities without deformity or TTP. No cyanosis or clubbing. Skin: warm, dry. Neuro: Does not follow commands, does not blink to threat on the right, head is slightly turned to the left and she has left facial droop.  Does not turn her head to speech.  1a  Level of consciousness: 1=not alert but arousable by minor stimulation to obey,  answer or respond  1b. LOC questions:  2=Performs neither task correctly  1c. LOC commands: 2=Performs neither task correctly  2.  Best Gaze: 2=forced deviation, or total gaze paresis not overcome by oculocephalic maneuver  3.  Visual: 2=Complete hemianopia  4. Facial Palsy: 1=Minor paralysis (flattened nasolabial fold, asymmetric on smiling)  5a.  Motor left arm: 4=No movement  5b.  Motor right arm: 2=Some effort against gravity, limb cannot get to or maintain (if cured) 90 (or 45) degrees, drifts down to bed, but has some effort against gravity  6a. motor left leg: 3=No effort against gravity, limb falls  6b  Motor right leg:  3=No effort against gravity, limb  falls  7. Limb Ataxia: 0=Absent  8.  Sensory: 0=Normal; no sensory loss  9. Best Language:  3=Mute, global aphasia; no usable speech or auditory comprehension  10. Dysarthria: 2=Severe; patient speech is so slurred as to be unintelligible in the absence of or our of proportion to any dysphagia, or is mute/anarthric  11. Extinction and Inattention: 0=No abnormality   Total:   27        ED Results / Procedures / Treatments   Labs (all labs ordered are listed, but only abnormal results are displayed) Labs Reviewed  I-STAT CHEM 8, ED - Abnormal; Notable for the following components:      Result Value   Sodium 150 (*)    Potassium 2.9 (*)    Calcium , Ion 1.13 (*)    All other components within normal limits  SARS CORONAVIRUS 2 BY RT PCR  HEMOGLOBIN A1C  BLOOD GAS, ARTERIAL  BASIC METABOLIC PANEL WITH GFR  CBC    EKG None  Radiology DG Abd Portable 1V Result Date: 09/27/2023 CLINICAL DATA:  Orogastric tube placement. EXAM: PORTABLE ABDOMEN - 1 VIEW COMPARISON:  Abdominopelvic CT 12/27/2018. FINDINGS: 1424 hours. Tip of the enteric tube overlies the L2 vertebral body, likely within the distal stomach. The visualized bowel gas pattern is nonobstructive. Mildly prominent colonic stool. There is contrast material within the  renal collecting systems bilaterally attributed to previous CTA. Mild degenerative changes in the spine without acute osseous abnormality. IMPRESSION: Enteric tube tip overlies the distal stomach. Electronically Signed   By: Elsie Perone M.D.   On: 09/27/2023 15:08   Portable Chest x-ray Result Date: 09/27/2023 CLINICAL DATA:  [id number] Endotracheal tube present [id number] EXAM: PORTABLE CHEST 1 VIEW COMPARISON:  Radiographs 07/13/2017 and 04/26/2023. Cardiac CT 11/25/2022. FINDINGS: Tip of the endotracheal tube is at the carina and should be withdrawn approximately 5 cm. An enteric tube projects below the diaphragm, tip not visualized on this examination. There are low lung volumes with mild resulting atelectasis at both lung bases. No confluent airspace disease, pneumothorax or significant pleural effusion identified. The heart size and mediastinal contours are stable with aortic atherosclerosis. Patient is status post lower cervical fusion. No acute osseous findings are evident. IMPRESSION: 1. Tip of the endotracheal tube is at the carina and should be withdrawn approximately 5 cm. 2. Low lung volumes with mild bibasilar atelectasis. 3. These results will be called to the ordering clinician or representative by the Radiologist Assistant, and communication documented in the PACS or [employer]. Electronically Signed   By: Elsie Perone M.D.   On: 09/27/2023 15:06   CT ANGIO HEAD NECK W WO CM (CODE STROKE) Result Date: 09/27/2023 EXAM: CTA Head and Neck with Intravenous Contrast. CLINICAL HISTORY: Neuro deficit, acute, stroke suspected. Left sided gaze. AMS TECHNIQUE: Axial CTA images of the head and neck performed with intravenous contrast. MIP reconstructed images were created and reviewed. Axial computed tomography images of the head/brain performed without intravenous contrast. Note: Per PQRS, the description of internal carotid artery percent stenosis, including 0 percent or normal exam, is based on  [place] Symptomatic Carotid Endarterectomy Trial (NASCET) criteria. Dose reduction technique was used including one or more of the following: automated exposure control, adjustment of mA and kV according to patient size, and/or iterative reconstruction. CONTRAST: Without and with; 75mL iohexol  (OMNIPAQUE ) 350 MG/ML injection COMPARISON: CT head same day FINDINGS: CTA NECK: COMMON CAROTID ARTERIES: Mild-to-moderate atherosclerosis of the visualized aortic arch involving the origin of the left subclavian artery  without significant stenosis. INTERNAL CAROTID ARTERIES: Mild calcified atherosclerosis at the right carotid bifurcation without hemodynamically significant stenosis. Minimal focal atherosclerosis at the left carotid bifurcation without hemodynamically significant stenosis. VERTEBRAL ARTERIES: The left vertebral artery is dominant. Atherosclerosis at the vertebral artery origins. There is mild stenosis at the right vertebral artery origin. There is mild tortuosity of the left V1 segment. The vertebral arteries are patent from the origins to the proximal V4 segments. There is diminished contrast within the vertebral arteries within the V3 and proximal V4 segments. There is occlusion of the V4 segment of the right vertebral artery likely distal to the origin of the right PICA. There is tapering and significantly diminished contrast within the distal left V4 segment. CTA HEAD: The intracranial internal carotid arteries are patent bilaterally. Atherosclerosis of the bilateral carotid siphons with mild stenosis of the bilateral cavernous segments. ANTERIOR CEREBRAL ARTERIES: The anterior cerebral arteries are patent bilaterally. MIDDLE CEREBRAL ARTERIES: The middle cerebral arteries are patent bilaterally. POSTERIOR CEREBRAL ARTERIES: The right posterior cerebral artery is supplied via the right posterior communicating artery and appears patent although somewhat diminished in caliber. There is reconstitution  of the P1 segment of the left PCA. There is severe stenosis of the P2P segment of the left PCA. There are patent parieto-occipital branches of the left PCA. There is likely occlusion of the calcarine artery on the left. BASILAR ARTERY: Occlusion of the proximal basilar artery. OTHER: There is faint contrast within the proximal aspect of the superior cerebellar arteries. The left superior cerebellar artery is occluded proximally. The right superior cerebellar artery appears patent but diminished in caliber. There is diminished contrast within the posterior inferior cerebellar arteries bilaterally. The left PICA origin is possibly extradural. SOFT TISSUES: No acute finding. No masses or lymphadenopathy. BONES: Degenerative changes in the visualized spine. Anterior cervical fusion hardware from C3 to C5. IMPRESSION: 1. 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 2. 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 3. 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 The right superior cerebellar artery appears patent but significantly diminished in caliber. 4. 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 Tapering and significantly diminished contrast within the distal left v4 segment. 5. Mild-to-moderate atherosclerosis of the visualized aortic arch involving the origin of the left subclavian artery. 6. Findings discussed with Dr. Vanessa at 12:20PM on 09/27/23. Electronically signed by: Donnice Mania MD 09/27/2023 12:51 PM EDT RP Workstation: [id number]   CT HEAD CODE STROKE WO CONTRAST Result Date: 09/27/2023 EXAM: CT HEAD WITHOUT CONTRAST 09/27/2023 12:12:00 PM TECHNIQUE: CT of the head was performed without the administration of intravenous contrast. Automated exposure control, iterative reconstruction, and/or weight based adjustment of the mA/kV was utilized to reduce the  radiation dose to as low as reasonably achievable. COMPARISON: CT head 48 25 CLINICAL HISTORY: Neuro deficit, acute, stroke suspected. Aphasia. Left sided gaze. FINDINGS: BRAIN AND VENTRICLES: Nonspecific hypoattenuation in the periventricular and subcortical white matter, most likely representing chronic small vessel disease. Similar volume loss similar prominence of the ventricles. ORBITS: Bilateral lens replacement. SINUSES: No acute abnormality. SOFT TISSUES AND SKULL: No acute soft tissue abnormality. No skull fracture. VASCULATURE: Atherosclerosis involving the carotid siphons. [place] stroke program early CT (ASPECT) score: Ganglionic (caudate, IC, lentiform nucleus, insula, M1-M3): 7. Supraganglionic (M4-M6): 3. Total: 10. IMPRESSION: 1. No acute intracranial abnormality. 2. Findings messaged  to Dr. Khaliqdina at 12:19PM on 09/27/23. Electronically signed by: Donnice Mania MD 09/27/2023 12:20 PM EDT RP Workstation: [id number]    Procedures .Critical Care  Performed by: Franklyn Sid SAILOR, MD Authorized by: Franklyn Sid SAILOR, MD   Critical care provider statement:    Critical care time (minutes):  30   Critical care was necessary to treat or prevent imminent or life-threatening deterioration of the following conditions:  CNS failure or compromise   Critical care was time spent personally by me on the following activities:  Development of treatment plan with patient or surrogate, discussions with consultants, evaluation of patient's response to treatment, examination of patient, ordering and review of laboratory studies, ordering and review of radiographic studies, ordering and performing treatments and interventions, pulse oximetry and review of old charts   Care discussed with: admitting provider       Medications Ordered in ED Medications   stroke: early stages of recovery book (has no administration in time range)  0.9 %  sodium chloride  infusion ( Intravenous New Bag/Given 09/27/23 1414)   acetaminophen  (TYLENOL ) tablet 650 mg (has no administration in time range)    Or  acetaminophen  (TYLENOL ) 160 MG/5ML solution 650 mg (has no administration in time range)    Or  acetaminophen  (TYLENOL ) suppository 650 mg (has no administration in time range)  senna-docusate (Senokot-S) tablet 1 tablet (has no administration in time range)  sodium chloride  0.9 % bolus 250 mL (has no administration in time range)  clevidipine  (CLEVIPREX ) infusion 0.5 mg/mL (0 mg/hr Intravenous Hold 09/27/23 1412)  Chlorhexidine  Gluconate Cloth 2 % PADS 6 each (has no administration in time range)  Oral care mouth rinse (has no administration in time range)  Oral care mouth rinse (has no administration in time range)  famotidine  (PEPCID ) tablet 20 mg (has no administration in time range)  docusate (COLACE) 50 MG/5ML liquid 100 mg (has no administration in time range)  polyethylene glycol (MIRALAX  / GLYCOLAX ) packet 17 g (has no administration in time range)  fentaNYL  (SUBLIMAZE ) injection 50 mcg (has no administration in time range)  fentaNYL  (SUBLIMAZE ) injection 50-200 mcg (has no administration in time range)  propofol  (DIPRIVAN ) 1000 MG/100ML infusion (40 mcg/kg/min  99 kg Intravenous New Bag/Given 09/27/23 1443)  hydroxychloroquine  (PLAQUENIL ) tablet 200 mg (has no administration in time range)  levothyroxine  (SYNTHROID ) tablet 150 mcg (has no administration in time range)  rosuvastatin  (CRESTOR ) tablet 40 mg (has no administration in time range)  albuterol  (PROVENTIL ) (2.5 MG/3ML) 0.083% nebulizer solution 2.5 mg (has no administration in time range)  iohexol  (OMNIPAQUE ) 350 MG/ML injection 75 mL (75 mLs Intravenous Contrast Given 09/27/23 1217)  tenecteplase  (TNKASE ) injection for Stroke 25 mg (25 mg Intravenous Given 09/27/23 1228)  iohexol  (OMNIPAQUE ) 300 MG/ML solution 150 mL (50 mLs Intra-arterial Contrast Given 09/27/23 1322)  ondansetron  (ZOFRAN ) injection 4 mg (4 mg Intravenous Given 09/27/23 1209)     ED Course/ Medical Decision Making/ A&P                          Medical Decision Making Risk Decision regarding hospitalization.    This patient presents to the ED for concern of strokelike symptoms, this involves an extensive number of treatment options, and is a complaint that carries with it a high risk of complications and morbidity.  I considered the following differential and admission for this acute, potentially life threatening condition.   MDM:    Patient with acute onset neurodeficits and headache  with nausea and vomiting.  NIH stroke scale 27.  She received IV Zofran  with EMS and is protecting her airway on arrival.  Straight to CT scanner and CTA demonstrates an acute basilar artery occlusion as well as right vert V4 occlusion and left vert V4 severely diminished.  Blood pressure is 177/107.  Neurology consented patient's daughter over the phone for TNK.  Daughter was also consented for thrombectomy.  Went immediately to thrombectomy with Dr. Lester.     Labs: I Ordered, and personally interpreted labs.  The pertinent results include:  those listed above  Imaging Studies ordered: I ordered imaging studies including CTH, CTA H&N I independently visualized and interpreted imaging. I agree with the radiologist interpretation  Additional history obtained from chart review, EMS.  Social Determinants of Health:  lives independently  Disposition:  to IR suite  Co morbidities that complicate the patient evaluation  Past Medical History:  Diagnosis Date   Arthritis    everywhere   Asthma    Bipolar disorder (HCC)    GERD (gastroesophageal reflux disease)    Hearing loss    History of hiatal hernia 2011   treated with medication, no surgical repair   History of Sjogren's disease (HCC)    HLD (hyperlipidemia)    Hypertension    Hypothyroidism    per patient, not taking synthroid  anymore   Incomplete bladder emptying    Memory loss    Obesity    Recurrent UTI     Ringing in ear, bilateral    Vision abnormalities      Medicines Meds ordered this encounter  Medications   iohexol  (OMNIPAQUE ) 350 MG/ML injection 75 mL   tenecteplase  (TNKASE ) injection for Stroke 25 mg   iohexol  (OMNIPAQUE ) 300 MG/ML solution 150 mL   DISCONTD: clevidipine  (CLEVIPREX ) infusion 0.5 mg/mL   ondansetron  (ZOFRAN ) injection 4 mg   DISCONTD: labetalol  (NORMODYNE ) injection    stroke: early stages of recovery book   0.9 %  sodium chloride  infusion   OR Linked Order Group    acetaminophen  (TYLENOL ) tablet 650 mg    acetaminophen  (TYLENOL ) 160 MG/5ML solution 650 mg    acetaminophen  (TYLENOL ) suppository 650 mg   senna-docusate (Senokot-S) tablet 1 tablet   sodium chloride  0.9 % bolus 250 mL   clevidipine  (CLEVIPREX ) infusion 0.5 mg/mL   Chlorhexidine  Gluconate Cloth 2 % PADS 6 each   Oral care mouth rinse   Oral care mouth rinse   famotidine  (PEPCID ) tablet 20 mg   docusate (COLACE) 50 MG/5ML liquid 100 mg   polyethylene glycol (MIRALAX  / GLYCOLAX ) packet 17 g   fentaNYL  (SUBLIMAZE ) injection 50 mcg   fentaNYL  (SUBLIMAZE ) injection 50-200 mcg   propofol  (DIPRIVAN ) 1000 MG/100ML infusion   DISCONTD: albuterol  (VENTOLIN  HFA) 108 (90 Base) MCG/ACT inhaler 1-2 puff   hydroxychloroquine  (PLAQUENIL ) tablet 200 mg   levothyroxine  (SYNTHROID ) tablet 150 mcg   rosuvastatin  (CRESTOR ) tablet 40 mg   albuterol  (PROVENTIL ) (2.5 MG/3ML) 0.083% nebulizer solution 2.5 mg    I have reviewed the patients home medicines and have made adjustments as needed  Problem List / ED Course: Problem List Items Addressed This Visit   None Visit Diagnoses       Basilar artery occlusion    -  Primary   Relevant Medications   lisinopril -hydrochlorothiazide  (ZESTORETIC ) 20-25 MG tablet   lovastatin (MEVACOR) 10 MG tablet   clevidipine  (CLEVIPREX ) infusion 0.5 mg/mL   rosuvastatin  (CRESTOR ) tablet 40 mg (Start on 09/28/2023 10:00 AM)  This note was created  using dictation software, which may contain spelling or grammatical errors.    Franklyn Sid SAILOR, MD 09/27/23 [phone]

## 2023-09-27 NOTE — Transfer of Care (Signed)
 Immediate Anesthesia Transfer of Care Note  Patient: Jessica Norman  Procedure(s) Performed: RADIOLOGY WITH ANESTHESIA  Patient Location: ICU  Anesthesia Type:General  Level of Consciousness: Patient remains intubated per anesthesia plan  Airway & Oxygen Therapy: Patient remains intubated per anesthesia plan and Patient placed on Ventilator (see vital sign flow sheet for setting)  Post-op Assessment: Report given to RN and Post -op Vital signs reviewed and stable  Post vital signs: Reviewed and stable  Last Vitals:  Vitals Value Taken Time  BP 108/94 09/27/23 13:45  Temp    Pulse 116 09/27/23 13:51  Resp 18 09/27/23 13:51  SpO2 99 % 09/27/23 13:51  Vitals shown include unfiled device data.  Last Pain:  Vitals:   09/27/23 1246  PainSc: 0-No pain         Complications: No notable events documented.

## 2023-09-27 NOTE — Procedures (Signed)
 Extubation Procedure Note  Patient Details:   Name: Jessica Norman DOB: 06-05-1950 MRN: [id number]   Airway Documentation:    Vent end date: 09/27/23 Vent end time: 1840   Evaluation  O2 sats: stable throughout Complications: No apparent complications Patient did tolerate procedure well. Bilateral Breath Sounds: Rhonchi   Yes  Pt extubated to 4l [place] with RN at bedside with positive cuff leak noted. VSS and pt tolerating well.   [name] 09/27/2023, 6:49 PM

## 2023-09-27 NOTE — ED Notes (Signed)
 This paramedic accompanied pt to CT with stroke team

## 2023-09-27 NOTE — Code Documentation (Signed)
 Stroke Response Nurse Documentation Code Documentation  Jessica Norman is a 73 y.o. female arriving to [facility]  via [facility] on 9/9 with past medical hx of HLD, HTN, GERD. On No antithrombotic. Code stroke was activated by EMS.   Patient from home where she was LKW at 1038 at which time she had sudden onset of severe headache and collapsed to the ground.    Stroke team at the bedside on patient arrival. Labs drawn and patient cleared for CT by Dr. Franklyn. Patient to CT with team. NIHSS 27, see documentation for details and code stroke times. Patient with decreased LOC, disoriented, not following commands, left gaze preference , right hemianopia, left facial droop, bilateral arm weakness, bilateral leg weakness, Global aphasia , and dysarthria  on exam. The following imaging was completed:  CT Head and CTA. Patient is a candidate for IV Thrombolytic. TNK given 1228. Patient is a candidate for IR due to basilar artery occlusion.   Care Plan: TNK, IR.   Process delays noted: blood pressure control prior to TNK  Bedside handoff with IR RN Monkia.    Lauraine LITTIE Searle  Stroke Response RN

## 2023-09-27 NOTE — Sedation Documentation (Signed)
 Pt udner care of anesthesia. See CRNA charting.

## 2023-09-27 NOTE — Anesthesia Postprocedure Evaluation (Signed)
 Anesthesia Post Note  Patient: Jessica Norman  Procedure(s) Performed: RADIOLOGY WITH ANESTHESIA     Patient location during evaluation: ICU Anesthesia Type: General Level of consciousness: responds to stimulation and patient remains intubated per anesthesia plan (following commands) Pain management: pain level controlled Vital Signs Assessment: post-procedure vital signs reviewed and stable Respiratory status: patient remains intubated per anesthesia plan and patient on ventilator - see flowsheet for VS Cardiovascular status: stable (on cleviprex ) Postop Assessment: no apparent nausea or vomiting Anesthetic complications: no Comments: Pt doing well, moving all extrem, following simple commands for ICU team   No notable events documented.  Last Vitals:  Vitals:   09/27/23 1415 09/27/23 1447  BP: (!) 131/119 136/70  Pulse:  97  Resp: 18 18  SpO2:      Last Pain:  Vitals:   09/27/23 1246  PainSc: 0-No pain                 Kamia Insalaco,E. Terika Pillard

## 2023-09-27 NOTE — ED Triage Notes (Signed)
 Patient from home as code stroke. Normal state of health, walking to the bathroom and at 1038 had sudden onset severe headache and collapsed to the ground.

## 2023-09-28 ENCOUNTER — Inpatient Hospital Stay (HOSPITAL_COMMUNITY)

## 2023-09-28 ENCOUNTER — Telehealth (HOSPITAL_COMMUNITY): Payer: Self-pay | Admitting: Pharmacy Technician

## 2023-09-28 ENCOUNTER — Encounter (HOSPITAL_COMMUNITY): Payer: Self-pay | Admitting: Radiology

## 2023-09-28 ENCOUNTER — Other Ambulatory Visit (HOSPITAL_COMMUNITY): Payer: Self-pay

## 2023-09-28 DIAGNOSIS — R29701 NIHSS score 1: Secondary | ICD-10-CM

## 2023-09-28 DIAGNOSIS — I651 Occlusion and stenosis of basilar artery: Secondary | ICD-10-CM

## 2023-09-28 DIAGNOSIS — E7800 Pure hypercholesterolemia, unspecified: Secondary | ICD-10-CM | POA: Diagnosis not present

## 2023-09-28 DIAGNOSIS — I6322 Cerebral infarction due to unspecified occlusion or stenosis of basilar arteries: Secondary | ICD-10-CM

## 2023-09-28 DIAGNOSIS — I63531 Cerebral infarction due to unspecified occlusion or stenosis of right posterior cerebral artery: Secondary | ICD-10-CM | POA: Diagnosis not present

## 2023-09-28 DIAGNOSIS — I63542 Cerebral infarction due to unspecified occlusion or stenosis of left cerebellar artery: Secondary | ICD-10-CM | POA: Diagnosis not present

## 2023-09-28 DIAGNOSIS — R079 Chest pain, unspecified: Secondary | ICD-10-CM

## 2023-09-28 DIAGNOSIS — I4891 Unspecified atrial fibrillation: Secondary | ICD-10-CM

## 2023-09-28 DIAGNOSIS — I480 Paroxysmal atrial fibrillation: Secondary | ICD-10-CM

## 2023-09-28 DIAGNOSIS — I2510 Atherosclerotic heart disease of native coronary artery without angina pectoris: Secondary | ICD-10-CM | POA: Diagnosis not present

## 2023-09-28 DIAGNOSIS — I6389 Other cerebral infarction: Secondary | ICD-10-CM

## 2023-09-28 LAB — COMPREHENSIVE METABOLIC PANEL WITH GFR
ALT: 18 U/L (ref 0–44)
AST: 25 U/L (ref 15–41)
Albumin: 3.3 g/dL — ABNORMAL LOW (ref 3.5–5.0)
Alkaline Phosphatase: 59 U/L (ref 38–126)
Anion gap: 8 (ref 5–15)
BUN: 7 mg/dL — ABNORMAL LOW (ref 8–23)
CO2: 23 mmol/L (ref 22–32)
Calcium: 9.2 mg/dL (ref 8.9–10.3)
Chloride: 108 mmol/L (ref 98–111)
Creatinine, Ser: 1.06 mg/dL — ABNORMAL HIGH (ref 0.44–1.00)
GFR, Estimated: 55 mL/min — ABNORMAL LOW (ref >60)
Glucose, Bld: 101 mg/dL — ABNORMAL HIGH (ref 70–99)
Potassium: 3.6 mmol/L (ref 3.5–5.1)
Sodium: 139 mmol/L (ref 135–145)
Total Bilirubin: 0.9 mg/dL (ref 0.0–1.2)
Total Protein: 7 g/dL (ref 6.5–8.1)

## 2023-09-28 LAB — MAGNESIUM: Magnesium: 1.8 mg/dL (ref 1.7–2.4)

## 2023-09-28 LAB — LIPID PANEL
Cholesterol: 110 mg/dL (ref 0–200)
HDL: 43 mg/dL (ref >40)
LDL Cholesterol: 56 mg/dL (ref 0–99)
Total CHOL/HDL Ratio: 2.6 ratio
Triglycerides: 53 mg/dL (ref <150)
VLDL: 11 mg/dL (ref 0–40)

## 2023-09-28 LAB — ECHOCARDIOGRAM COMPLETE
Area-P 1/2: 3.15 cm2
Height: 69 in
S' Lateral: 3.1 cm
Single Plane A4C EF: 53.5 %
Weight: 3492.09 [oz_av]

## 2023-09-28 LAB — TRIGLYCERIDES: Triglycerides: 55 mg/dL (ref <150)

## 2023-09-28 MED ORDER — POTASSIUM CHLORIDE CRYS ER 20 MEQ PO TBCR
40.0000 meq | EXTENDED_RELEASE_TABLET | Freq: Once | ORAL | Status: AC
  Administered 2023-09-28: 40 meq via ORAL
  Filled 2023-09-28: qty 2

## 2023-09-28 MED ORDER — PRAVASTATIN SODIUM 10 MG PO TABS
10.0000 mg | ORAL_TABLET | Freq: Every day | ORAL | Status: DC
  Administered 2023-09-28: 10 mg via ORAL
  Filled 2023-09-28 (×2): qty 1

## 2023-09-28 MED ORDER — APIXABAN 5 MG PO TABS: 5.0000 mg | ORAL_TABLET | Freq: Two times a day (BID) | ORAL | Status: DC

## 2023-09-28 MED ORDER — POLYETHYLENE GLYCOL 3350 17 G PO PACK
17.0000 g | PACK | Freq: Every day | ORAL | Status: DC
  Filled 2023-09-28: qty 1

## 2023-09-28 MED ORDER — METOPROLOL TARTRATE 25 MG PO TABS
25.0000 mg | ORAL_TABLET | Freq: Three times a day (TID) | ORAL | Status: DC
  Administered 2023-09-28 – 2023-09-29 (×2): 25 mg via ORAL
  Filled 2023-09-28 (×2): qty 1

## 2023-09-28 MED ORDER — LISINOPRIL 20 MG PO TABS
20.0000 mg | ORAL_TABLET | Freq: Every day | ORAL | Status: DC
  Administered 2023-09-28 – 2023-09-29 (×2): 20 mg via ORAL
  Filled 2023-09-28 (×2): qty 1

## 2023-09-28 MED ORDER — HYDRALAZINE HCL 20 MG/ML IJ SOLN
10.0000 mg | INTRAMUSCULAR | Status: DC | PRN
  Administered 2023-09-28: 10 mg via INTRAVENOUS
  Filled 2023-09-28: qty 1

## 2023-09-28 MED ORDER — AMLODIPINE BESYLATE 5 MG PO TABS
10.0000 mg | ORAL_TABLET | Freq: Every day | ORAL | Status: DC
  Administered 2023-09-28 – 2023-09-29 (×2): 10 mg via ORAL
  Filled 2023-09-28: qty 1
  Filled 2023-09-28: qty 2

## 2023-09-28 MED ORDER — MAGNESIUM SULFATE 2 GM/50ML IV SOLN
2.0000 g | Freq: Once | INTRAVENOUS | Status: AC
  Administered 2023-09-28: 2 g via INTRAVENOUS
  Filled 2023-09-28: qty 50

## 2023-09-28 MED ORDER — ONDANSETRON HCL 4 MG/2ML IJ SOLN
4.0000 mg | Freq: Four times a day (QID) | INTRAMUSCULAR | Status: DC | PRN
  Administered 2023-09-28: 4 mg via INTRAVENOUS
  Filled 2023-09-28: qty 2

## 2023-09-28 MED ORDER — LABETALOL HCL 5 MG/ML IV SOLN
10.0000 mg | INTRAVENOUS | Status: DC | PRN
  Administered 2023-09-28: 10 mg via INTRAVENOUS
  Filled 2023-09-28: qty 4

## 2023-09-28 MED ORDER — PERFLUTREN LIPID MICROSPHERE
1.0000 mL | INTRAVENOUS | Status: AC | PRN
  Administered 2023-09-28: 4 mL via INTRAVENOUS

## 2023-09-28 MED ORDER — HYDROXYCHLOROQUINE SULFATE 200 MG PO TABS
200.0000 mg | ORAL_TABLET | Freq: Two times a day (BID) | ORAL | Status: DC
  Administered 2023-09-28 – 2023-09-29 (×2): 200 mg via ORAL
  Filled 2023-09-28 (×4): qty 1

## 2023-09-28 MED ORDER — DABIGATRAN ETEXILATE MESYLATE 150 MG PO CAPS
150.0000 mg | ORAL_CAPSULE | Freq: Two times a day (BID) | ORAL | Status: DC
  Administered 2023-09-28 – 2023-09-29 (×2): 150 mg via ORAL
  Filled 2023-09-28 (×4): qty 1

## 2023-09-28 MED ORDER — LEVOTHYROXINE SODIUM 75 MCG PO TABS
150.0000 ug | ORAL_TABLET | Freq: Every day | ORAL | Status: DC
  Administered 2023-09-29: 150 ug via ORAL
  Filled 2023-09-28: qty 2

## 2023-09-28 NOTE — Progress Notes (Addendum)
 STROKE TEAM PROGRESS NOTE    SIGNIFICANT HOSPITAL EVENTS 9/9: admitted with basilar artery occlusion, TNK and thrombectomy performed  INTERIM HISTORY/SUBJECTIVE Patient is resting in bed, while sonographer performs TTE.  Patient has irregular rhythm on monitor likely new onset A-fib, but doing very well overall, she feels the same as she did prior to stroke event. MRI scan of the brain shows nonhemorrhagic small infarction in left cerebellum and left occipital lobe, punctate infarct at the tail of right hippocampus OBJECTIVE  CBC    Component Value Date/Time   WBC 10.7 (H) 09/27/2023 1435   RBC 4.40 09/27/2023 1435   HGB 12.2 09/27/2023 1523   HCT 36.0 09/27/2023 1523   PLT 173 09/27/2023 1435   MCV 86.4 09/27/2023 1435   MCH 26.8 09/27/2023 1435   MCHC 31.1 09/27/2023 1435   RDW 14.7 09/27/2023 1435   LYMPHSABS 0.5 (L) 04/26/2023 1017   MONOABS 0.5 04/26/2023 1017   EOSABS 0.3 04/26/2023 1017   BASOSABS 0.0 04/26/2023 1017    BMET    Component Value Date/Time   NA 143 09/27/2023 1523   NA 143 05/22/2015 1033   K 3.6 09/27/2023 1523   CL 109 09/27/2023 1435   CO2 23 09/27/2023 1435   GLUCOSE 93 09/27/2023 1435   BUN 10 09/27/2023 1435   BUN 19 05/22/2015 1033   CREATININE 1.01 (H) 09/27/2023 1435   CALCIUM  8.7 (L) 09/27/2023 1435   GFRNONAA 59 (L) 09/27/2023 1435    IMAGING past 24 hours IR PERCUTANEOUS ART THROMBECTOMY/INFUSION INTRACRANIAL INC DIAG ANGIO Result Date: 09/28/2023 INDICATION: Acute Large Vessel occlusion, EXAM: Mechanical thrombectomy, aspiration thrombectomy CONSENT: Consent was obtained by telephone from the patient's daughter MEDICATIONS: No additional ANESTHESIA/SEDATION: General CONTRAST:  Thirty ml Omnipaque  300 FLUOROSCOPY: Radiation Exposure Index (as provided by the fluoroscopic device): 325 MGy reference air Kerma COMPLICATIONS: None immediate. TECHNIQUE: Maximal Sterile Barrier Technique was utilized including caps, mask, sterile gowns,  sterile gloves, sterile drape, hand hygiene and skin antiseptic. A timeout was performed prior to the initiation of the procedure. FINDINGS: Left subclavian: The left vertebral artery origin is normal. There is sluggish flow in the left vertebral artery. 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 Post thrombectomy left vertebral: There is normal flow in the left vertebral artery and basilar artery with no residual basilar thrombus. There is a normal cerebellar blush. The left posterior cerebral artery fills from the vertebral injection. There is flash flow in the right posterior cerebral artery. Right carotid post thrombectomy: The carotid artery, anterior, middle and posterior cerebral arteries are normal. There is a large posterior communicating artery supplying the right posterior cerebral artery. PROCEDURE: Femoral access was obtained with ultrasound using direct visualization of the needle puncture into the artery. A 80 cm neuron max sheath was placed. The neuron max was advanced over a Berenstein catheter into the left vertebral artery. The Red 72 Sendit catheter was advanced over a synchro support wire to the level of the occlusion and connected to suction. The catheter was then withdrawn under aspiration. This resulted in aspiration of large amount of thrombus. Follow-up arteriogram showed an embolus occluding the distal basilar artery. The Red 72 Sendit catheter was readvanced over a synchro support wire to the level of the occlusion and connected to suction. The catheter was then withdrawn under aspiration. This resulted in aspiration of the embolus. The follow-up arteriogram now demonstrated normal flow in the left vertebral artery, basilar artery, cerebellar arteries and left posterior cerebral artery.  There was flash filling of the right posterior cerebral artery. The catheters were removed from the left vertebral artery and the Berenstein catheter was placed into the right  common carotid artery for study of the cerebral circulation to identify the right posterior cerebral artery. This was found to be coming from the right carotid artery and was normal. Hemostasis was obtained with Angio-Seal. IMPRESSION: Large basilar embolus removed with embolectomy PLAN: Patient left intubated and admitted to ICU for observation Electronically Signed   By: Nancyann Burns M.D.   On: 09/28/2023 10:30   IR US  Guide Vasc Access Right Result Date: 09/28/2023 INDICATION: Acute Large Vessel occlusion, EXAM: Mechanical thrombectomy, aspiration thrombectomy CONSENT: Consent was obtained by telephone from the patient's daughter MEDICATIONS: No additional ANESTHESIA/SEDATION: General CONTRAST:  Thirty ml Omnipaque  300 FLUOROSCOPY: Radiation Exposure Index (as provided by the fluoroscopic device): 325 MGy reference air Kerma COMPLICATIONS: None immediate. TECHNIQUE: Maximal Sterile Barrier Technique was utilized including caps, mask, sterile gowns, sterile gloves, sterile drape, hand hygiene and skin antiseptic. A timeout was performed prior to the initiation of the procedure. FINDINGS: Left subclavian: The left vertebral artery origin is normal. There is sluggish flow in the left vertebral artery. 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 Post thrombectomy left vertebral: There is normal flow in the left vertebral artery and basilar artery with no residual basilar thrombus. There is a normal cerebellar blush. The left posterior cerebral artery fills from the vertebral injection. There is flash flow in the right posterior cerebral artery. Right carotid post thrombectomy: The carotid artery, anterior, middle and posterior cerebral arteries are normal. There is a large posterior communicating artery supplying the right posterior cerebral artery. PROCEDURE: Femoral access was obtained with ultrasound using direct visualization of the needle puncture into the artery. A 80 cm neuron max  sheath was placed. The neuron max was advanced over a Berenstein catheter into the left vertebral artery. The Red 72 Sendit catheter was advanced over a synchro support wire to the level of the occlusion and connected to suction. The catheter was then withdrawn under aspiration. This resulted in aspiration of large amount of thrombus. Follow-up arteriogram showed an embolus occluding the distal basilar artery. The Red 72 Sendit catheter was readvanced over a synchro support wire to the level of the occlusion and connected to suction. The catheter was then withdrawn under aspiration. This resulted in aspiration of the embolus. The follow-up arteriogram now demonstrated normal flow in the left vertebral artery, basilar artery, cerebellar arteries and left posterior cerebral artery. There was flash filling of the right posterior cerebral artery. The catheters were removed from the left vertebral artery and the Berenstein catheter was placed into the right common carotid artery for study of the cerebral circulation to identify the right posterior cerebral artery. This was found to be coming from the right carotid artery and was normal. Hemostasis was obtained with Angio-Seal. IMPRESSION: Large basilar embolus removed with embolectomy PLAN: Patient left intubated and admitted to ICU for observation Electronically Signed   By: Nancyann Burns M.D.   On: 09/28/2023 10:30   IR ANGIO EXTRACRAN SEL COM CAROTID INNOMINATE UNI R MOD SED Result Date: 09/28/2023 INDICATION: Acute Large Vessel occlusion, EXAM: Mechanical thrombectomy, aspiration thrombectomy CONSENT: Consent was obtained by telephone from the patient's daughter MEDICATIONS: No additional ANESTHESIA/SEDATION: General CONTRAST:  Thirty ml Omnipaque  300 FLUOROSCOPY: Radiation Exposure Index (as provided by the fluoroscopic device): 325 MGy reference air Kerma COMPLICATIONS: None immediate. TECHNIQUE: Maximal Sterile Barrier  Technique was utilized including caps,  mask, sterile gowns, sterile gloves, sterile drape, hand hygiene and skin antiseptic. A timeout was performed prior to the initiation of the procedure. FINDINGS: Left subclavian: The left vertebral artery origin is normal. There is sluggish flow in the left vertebral artery. 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 Post thrombectomy left vertebral: There is normal flow in the left vertebral artery and basilar artery with no residual basilar thrombus. There is a normal cerebellar blush. The left posterior cerebral artery fills from the vertebral injection. There is flash flow in the right posterior cerebral artery. Right carotid post thrombectomy: The carotid artery, anterior, middle and posterior cerebral arteries are normal. There is a large posterior communicating artery supplying the right posterior cerebral artery. PROCEDURE: Femoral access was obtained with ultrasound using direct visualization of the needle puncture into the artery. A 80 cm neuron max sheath was placed. The neuron max was advanced over a Berenstein catheter into the left vertebral artery. The Red 72 Sendit catheter was advanced over a synchro support wire to the level of the occlusion and connected to suction. The catheter was then withdrawn under aspiration. This resulted in aspiration of large amount of thrombus. Follow-up arteriogram showed an embolus occluding the distal basilar artery. The Red 72 Sendit catheter was readvanced over a synchro support wire to the level of the occlusion and connected to suction. The catheter was then withdrawn under aspiration. This resulted in aspiration of the embolus. The follow-up arteriogram now demonstrated normal flow in the left vertebral artery, basilar artery, cerebellar arteries and left posterior cerebral artery. There was flash filling of the right posterior cerebral artery. The catheters were removed from the left vertebral artery and the Berenstein catheter was  placed into the right common carotid artery for study of the cerebral circulation to identify the right posterior cerebral artery. This was found to be coming from the right carotid artery and was normal. Hemostasis was obtained with Angio-Seal. IMPRESSION: Large basilar embolus removed with embolectomy PLAN: Patient left intubated and admitted to ICU for observation Electronically Signed   By: Nancyann Burns M.D.   On: 09/28/2023 10:30   DG Abd Portable 1V Result Date: 09/27/2023 CLINICAL DATA:  Orogastric tube placement. EXAM: PORTABLE ABDOMEN - 1 VIEW COMPARISON:  Abdominopelvic CT 12/27/2018. FINDINGS: 1424 hours. Tip of the enteric tube overlies the L2 vertebral body, likely within the distal stomach. The visualized bowel gas pattern is nonobstructive. Mildly prominent colonic stool. There is contrast material within the renal collecting systems bilaterally attributed to previous CTA. Mild degenerative changes in the spine without acute osseous abnormality. IMPRESSION: Enteric tube tip overlies the distal stomach. Electronically Signed   By: Elsie Perone M.D.   On: 09/27/2023 15:08   Portable Chest x-ray Result Date: 09/27/2023 CLINICAL DATA:  [id number] Endotracheal tube present [id number] EXAM: PORTABLE CHEST 1 VIEW COMPARISON:  Radiographs 07/13/2017 and 04/26/2023. Cardiac CT 11/25/2022. FINDINGS: Tip of the endotracheal tube is at the carina and should be withdrawn approximately 5 cm. An enteric tube projects below the diaphragm, tip not visualized on this examination. There are low lung volumes with mild resulting atelectasis at both lung bases. No confluent airspace disease, pneumothorax or significant pleural effusion identified. The heart size and mediastinal contours are stable with aortic atherosclerosis. Patient is status post lower cervical fusion. No acute osseous findings are evident. IMPRESSION: 1. Tip of the endotracheal tube is at the carina and should be withdrawn approximately 5 cm. 2.  Low  lung volumes with mild bibasilar atelectasis. 3. These results will be called to the ordering clinician or representative by the Radiologist Assistant, and communication documented in the PACS or [employer]. Electronically Signed   By: Elsie Perone M.D.   On: 09/27/2023 15:06    Vitals:   09/28/23 1100 09/28/23 1200 09/28/23 1215 09/28/23 1230  BP: 135/73 (!) 174/138 (!) 143/74 (!) 157/90  Pulse: 96 (!) 113 (!) 103 100  Resp: (!) 22 19 14  (!) 23  Temp:  98.5 F (36.9 C)    TempSrc:  Axillary    SpO2: 92% 94% 95% (!) 88%  Weight:      Height:       PHYSICAL EXAM General:  Alert, well-nourished, well-developed patient in no acute distress Psych:  Mood and affect appropriate for situation CV: Irregular rate and rhythm on monitor Respiratory:  Regular, unlabored respirations on room air  NEURO:  Mental Status: AA&Ox3, patient is able to give clear and coherent history Speech/Language: speech is without dysarthria or aphasia.  Naming, repetition, fluency, and comprehension intact.  Cranial Nerves:  II: PERRL. Visual fields full.  III, IV, VI: EOMI. Eyelids elevate symmetrically.  V: Sensation is intact to light touch and symmetrical to face.  VII: Face is symmetrical resting and smiling VIII: hearing intact to voice. IX, X: Palate elevates symmetrically. Phonation is normal.  KP:Dynloizm shrug 5/5. XII: tongue is midline without fasciculations. Motor: 5/5 strength to all muscle groups tested.  Tone: is normal and bulk is normal Sensation- Decreased on R side throughout Coordination: FTN intact bilaterally, HKS: no ataxia in BLE.No drift.  Gait- deferred  Most Recent NIH 1 @ 1200 9/10   ASSESSMENT/PLAN  Jessica Norman is a 73 y.o. female with history of hypertension, hyperlipidemia, GERD, hypothyroidism, bipolar admitted for left gaze deviation and left-sided weakness.  Currently her neurologic exam is nonfocal except for some diminished sensation on her right  side.  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 Etiology likely new onset paroxysmal atrial fibrillation CT head with no acute abnormality and hyperdense basilar artery, ASPECTS 10.    CTA head & neck show proximal basilar artery occlusion, occlusion of left SCA, and occlusion of right vertebral artery V4 segment and significantly diminished dominant left V4 vertebral artery Post IR CT shows normal flow within left vertebral artery and basilar artery with no residual base or thrombus MRI small infarction in left cerebellum and left occipital lobe, punctate infarct at the tail of right hippocampus 2D Echo pending EKG showing atrial fibrillation LDL 56 HgbA1c 4.9 VTE prophylaxis -SCDs aspirin  81 mg daily prior to admission, now on Eliquis  (apixaban ) daily  Therapy recommendations:  Pending Disposition: Pending  Atrial fibrillation Home Meds: None Continue telemetry monitoring Begin anticoagulation with Eliquis   Hypertension Home meds: Lisinopril  20 mg daily, lovastatin 10 mg at night, hydralazine  25 mg twice a day, hydrochlorothiazide  25 mg daily Currently holding home medications, use labetalol  and Cleviprex  for BP control Stable Blood Pressure Goal: BP less than 180/105   Hyperlipidemia Home meds: Crestor  40 mg resumed in hospital LDL 56, goal < 70 High intensity statin not indicated  Continue statin at discharge  Other Stroke Risk Factors Obesity, Body mass index is 32.23 kg/m., BMI >/= 30 associated with increased stroke risk, recommend weight loss, diet and exercise as appropriate  Hospital day # 1 I have personally obtained history,examined this patient, reviewed notes, independently viewed imaging studies, participated in medical decision making and plan of care.ROS completed by me personally and pertinent  positives fully documented  I have made any additions or clarifications directly to the above note. Agree with note above.  Patient presented with sudden onset of left-sided weakness and gaze deviation with CT angiogram showing basilar artery occlusion underwent treatment with IV TNK followed by successful emergent mechanical thrombectomy with excellent revascularization.  Her clinical exam shows excellent improvement with very mild deficits.  MRI scan shows small left cerebellar and right PCA branch infarcts.  Cardiac monitor shows what appears to be new onset atrial fibrillation.  Recommend anticoagulation with IV heparin  24 hours after TNK and will need to be transition to oral anticoagulation at discharge.  Cardiology consult for new onset A-fib.  Continue cardiac monitoring and close neurological observation as per post thrombectomy and TNK protocol.  Discussed with patient and family at the bedside. This patient is critically ill and at significant risk of neurological worsening, death and care requires constant monitoring of vital signs, hemodynamics,respiratory and cardiac monitoring, extensive review of multiple databases, frequent neurological assessment, discussion with family, other specialists and medical decision making of high complexity.I have made any additions or clarifications directly to the above note.This critical care time does not reflect procedure time, or teaching time or supervisory time of PA/NP/Med Resident etc but could involve care discussion time.  I spent 30 minutes of neurocritical care time  in the care of  this patient.     Eather Popp, MD Medical Director [facility] Pager: [phone] 09/28/2023 4:00 PM  To contact Stroke Continuity provider, please refer to [email or web address]. After hours, contact General Neurology

## 2023-09-28 NOTE — Consult Note (Signed)
 Cardiology Consultation   Patient ID: Jessica Norman MRN: [id number]; DOB: 03/23/1950  Admit date: 09/27/2023 Date of Consult: 09/28/2023  PCP:  [place] HERO, MD   [facility] Providers Cardiologist:  Dr. Florencio with [facility] Click here to update MD or APP on Care Team, Refresh:1}     Patient Profile: Jessica Norman is a 73 y.o. female with a hx of mild-moderate CAD, asthma, bipolar disorder, HTN, HLD, depression, anemia, Sjogren's, arthritis, prior hypothyroidism, memory loss, obesity, neuropathy who is being seen 09/28/2023 for the evaluation of atrial fibrillation at the request of Dr. Rosemarie.  History of Present Illness: Jessica Norman follows with [facility]. She was seen November 2024 for chest pain and had an abnormal calcium  score and Myoview . This prompted cath in 12/2022 showing mild-moderate nonobstructive CAD (25% LM, 25% ostial-prox LAD, 50% ostial LCx, RCA OK, EF 55-60%), managed medically. No intervention needed at that time.  She was admitted yesterday with left gaze deviation and left sided weakness, found to have acute CVA felt possibly due to newly recognized atrial fibrillation. She underwent mechanical thrombectomy and TNK. Neurology team originally planned Eliquis  but has ordered Praxada due to significant cost difference, starting this evening. Echo shows EF 65-70%, mild LVH, trivial MR. Admit labs notable for K 2.9 on istat/Na 150 though unclear if accurate as recheck 2 hours later were reassuring. Blood pressure as been uptrending today. She was restarted on amlodipine  10mg  this morning and lisinopril  20mg  this afternoon. Was previously on Cleviprex  now off. She reports she can feel some awareness of her atrial fibrillation as if feels like she needs to cough though no clear-cut palpitations. Duration not entirely clar. She also shares she has had chronic chest pain - this was what prompted the cath in 12/2022 but over the last 6 months more  frequent. It happens 3-4 times a day, lasting just a few seconds at a time, primarily with exertion, but also able to exert herself other times without any symptoms at all. It resolves without intervention and is very fleeting. No recent dyspnea, edema, syncope. She was having episodic elevations in her HR to the 120s earlier. She got 10mg  IV labetalol  for elevated BP at 1725, HR 80s presently.   Past Medical History:  Diagnosis Date   Arthritis    everywhere   Asthma    Bipolar disorder (HCC)    GERD (gastroesophageal reflux disease)    Hearing loss    History of hiatal hernia 2011   treated with medication, no surgical repair   History of Sjogren's disease (HCC)    HLD (hyperlipidemia)    Hypertension    Hypothyroidism    per patient, not taking synthroid  anymore   Incomplete bladder emptying    Memory loss    Obesity    Recurrent UTI    Ringing in ear, bilateral    Vision abnormalities     Past Surgical History:  Procedure Laterality Date   ABDOMINAL HYSTERECTOMY     BACK SURGERY     x 3   CARPAL TUNNEL RELEASE Right    COLONOSCOPY     EXCISION OF TONGUE LESION N/A 12/19/2014   Procedure: EXCISION OF TONGUE LESION;  Surgeon: Carolee Hunter, MD;  Location: [facility] ORS;  Service: ENT;  Laterality: N/A;   EYE SURGERY     cataracts   FOOT SURGERY Right    lump on bottom of toe. removed and straightened toes   IR ANGIO EXTRACRAN SEL COM CAROTID INNOMINATE  UNI R MOD SED  09/27/2023   IR PERCUTANEOUS ART THROMBECTOMY/INFUSION INTRACRANIAL INC DIAG ANGIO  09/27/2023   IR US  GUIDE VASC ACCESS RIGHT  09/27/2023   JOINT REPLACEMENT Left    tkr   LEFT HEART CATH AND CORONARY ANGIOGRAPHY Left 12/29/2022   Procedure: LEFT HEART CATH AND CORONARY ANGIOGRAPHY;  Surgeon: Florencio Cara BIRCH, MD;  Location: [facility] INVASIVE CV LAB;  Service: Cardiovascular;  Laterality: Left;   LUMBAR LAMINECTOMY/DECOMPRESSION MICRODISCECTOMY Right 02/04/2020   Procedure: L2-4 OPEN LAMINECTOMY, RIGHT L5/S1  HEMILAMINECTOMY;  Surgeon: Bluford Standing, MD;  Location: [facility] ORS;  Service: Neurosurgery;  Laterality: Right;   NECK SURGERY     cadaver tissue used for cervical surgery   RADIOLOGY WITH ANESTHESIA N/A 09/27/2023   Procedure: RADIOLOGY WITH ANESTHESIA;  Surgeon: Radiologist, Medication, MD;  Location: [facility] OR;  Service: Radiology;  Laterality: N/A;   SHOULDER ARTHROSCOPY WITH OPEN ROTATOR CUFF REPAIR Left 01/16/2015   Procedure: SHOULDER ARTHROSCOPY WITH mini OPEN ROTATOR CUFF REPAIR,subacromial decompression, abrasion chondrop;asty with microfracture of glenoid, excision distal clavicle, biceps tenodesis, extensive debridement;  Surgeon: Norleen JINNY Maltos, MD;  Location: [facility] ORS;  Service: Orthopedics;  Laterality: Left;   TOTAL KNEE ARTHROPLASTY Left 07/26/2017   Procedure: TOTAL KNEE ARTHROPLASTY;  Surgeon: Maltos Norleen JINNY, MD;  Location: [facility] ORS;  Service: Orthopedics;  Laterality: Left;   TOTAL KNEE ARTHROPLASTY Right 07/01/2020   Procedure: TOTAL KNEE ARTHROPLASTY;  Surgeon: Maltos Norleen JINNY, MD;  Location: [facility] ORS;  Service: Orthopedics;  Laterality: Right;   VESICOVAGINAL FISTULA CLOSURE W/ TAH       Home Medications:  Prior to Admission medications   Medication Sig Start Date End Date Taking? Authorizing Provider  albuterol  (VENTOLIN  HFA) 108 (90 Base) MCG/ACT inhaler Inhale 1-2 puffs into the lungs every 6 (six) hours as needed for shortness of breath or wheezing. 07/14/22  Yes [provider]  amLODipine  (NORVASC ) 10 MG tablet Take 10 mg by mouth daily. 04/29/23  Yes [provider]  aspirin  EC 81 MG tablet Take 81 mg by mouth daily. 01/07/23 01/07/24 Yes [provider]  carvedilol  (COREG ) 25 MG tablet Take 25 mg by mouth 2 (two) times daily with a meal.   Yes [provider]  cycloSPORINE  (RESTASIS ) 0.05 % ophthalmic emulsion Place 1 drop into both eyes 2 (two) times daily.   Yes [provider]  divalproex  (DEPAKOTE  ER) 500 MG 24 hr tablet Take  1,000 mg by mouth at bedtime.   Yes [provider]  esomeprazole (NEXIUM) 40 MG capsule Take 40 mg by mouth in the morning.   Yes [provider]  fexofenadine (ALLEGRA) 180 MG tablet Take 180 mg by mouth daily.   Yes [provider]  furosemide (LASIX) 20 MG tablet Take 20 mg by mouth daily as needed for edema.   Yes [provider]  hydroxychloroquine  (PLAQUENIL ) 200 MG tablet Take 200 mg by mouth 2 (two) times daily.   Yes [provider]  isosorbide mononitrate (IMDUR) 30 MG 24 hr tablet Take 30 mg by mouth in the morning.   Yes [provider]  levothyroxine  (SYNTHROID ) 150 MCG tablet Take 150 mcg by mouth daily before breakfast.   Yes [provider]  lisinopril -hydrochlorothiazide  (ZESTORETIC ) 20-25 MG tablet Take 1 tablet by mouth daily. 09/20/23  Yes [provider]  lovastatin (MEVACOR) 10 MG tablet Take 10 mg by mouth at bedtime. 09/20/23  Yes [provider]  montelukast (SINGULAIR) 10 MG tablet Take 10 mg by mouth  at bedtime.   Yes [provider]  QUEtiapine  (SEROQUEL ) 25 MG tablet Take 25 mg by mouth 2 (two) times daily.   Yes [provider]  sertraline  (ZOLOFT ) 100 MG tablet Take 100 mg by mouth in the morning.   Yes [provider]  gabapentin (NEURONTIN) 300 MG capsule Take 300 mg by mouth 2 (two) times daily. Patient not taking: Reported on 09/28/2023 08/03/23   [provider]  hydrALAZINE  (APRESOLINE ) 25 MG tablet Take 25 mg by mouth in the morning and at bedtime. Patient not taking: Reported on 09/28/2023    [provider]  hydrochlorothiazide  (HYDRODIURIL ) 25 MG tablet Take 25 mg by mouth daily. Patient not taking: Reported on 09/28/2023    [provider]  pilocarpine (SALAGEN) 5 MG tablet Take 5 mg by mouth 2 (two) times daily. Patient not taking: Reported on 09/28/2023    [provider]  rosuvastatin  (CRESTOR ) 40 MG tablet Take 40 mg  by mouth daily. Patient not taking: Reported on 09/28/2023    [provider]  VESICARE 5 MG tablet Take 5 mg by mouth daily. Patient not taking: Reported on 09/28/2023 09/20/23   [provider]    Scheduled Meds:  amLODipine   10 mg Oral Daily   Chlorhexidine  Gluconate Cloth  6 each Topical Q0600   dabigatran   150 mg Oral Q12H   docusate  100 mg Oral BID   hydroxychloroquine   200 mg Oral BID   [START ON 09/29/2023] levothyroxine   150 mcg Oral QAC breakfast   lisinopril   20 mg Oral Daily   [START ON 09/29/2023] polyethylene glycol  17 g Oral Daily   pravastatin   10 mg Oral q1800   Continuous Infusions:  clevidipine  Stopped (09/28/23 1511)   PRN Meds: acetaminophen  **OR** acetaminophen  (TYLENOL ) oral liquid 160 mg/5 mL **OR** acetaminophen , albuterol , hydrALAZINE , labetalol , mouth rinse, senna-docusate  Allergies:    Allergies  Allergen Reactions   Pantoprazole  Anaphylaxis   Codeine Hives   Penicillins Hives   Sulfa Antibiotics Hives    Social History:   Social History   Socioeconomic History   Marital status: Married    Spouse name: Monroe   Number of children: 2   Years of education: college   Highest education level: Not on file  Occupational History   Occupation: accounting    Comment: retired  Tobacco Use   Smoking status: Former    Current packs/day: 0.00    Types: Cigarettes    Quit date: 01/13/1965    Years since quitting: 58.7   Smokeless tobacco: Never  Vaping Use   Vaping status: Never Used  Substance and Sexual Activity   Alcohol  use: No    Alcohol /week: 0.0 standard drinks of alcohol     Comment: occ   Drug use: No   Sexual activity: Yes  Other Topics Concern   Not on file  Social History Narrative   Caffeine use: 1 soda per day, 1 coffee per day   Right handed    Lives with husband.   Lives on first floor. Feels safe in her home.   Social Drivers of Corporate investment banker Strain: Low Risk  (01/06/2023)   Received from  [facility]   Overall Financial Resource Strain (CARDIA)    Difficulty of Paying Living Expenses: Not hard at all  Food Insecurity: No Food Insecurity (01/06/2023)   Received from [facility]   Hunger Vital Sign    Within the past 12 months, you  worried that your food would run out before you got the money to buy more.: Never true    Within the past 12 months, the food you bought just didn't last and you didn't have money to get more.: Never true  Transportation Needs: No Transportation Needs (01/06/2023)   Received from [facility] - Transportation    In the past 12 months, has lack of transportation kept you from medical appointments or from getting medications?: No    Lack of Transportation (Non-Medical): No  Physical Activity: Not on file  Stress: Not on file  Social Connections: Not on file  Intimate Partner Violence: Not on file    Family History:    Family History  Problem Relation Age of Onset   Diabetes Mother    Stroke Mother    COPD Father    Tuberculosis Father    Kidney disease Neg Hx    Bladder Cancer Neg Hx      ROS:  Please see the history of present illness.  All other ROS reviewed and negative.     Physical Exam/Data: Vitals:   09/28/23 1500 09/28/23 1600 09/28/23 1700 09/28/23 1800  BP: (!) 145/121 (!) 153/99 (!) 168/101 (!) 166/99  Pulse:  (!) 102 90 85  Resp:      Temp: 98.5 F (36.9 C)     TempSrc: Axillary     SpO2:  96% 92% 96%  Weight:      Height:        Intake/Output Summary (Last 24 hours) at 09/28/2023 1812 Last data filed at 09/28/2023 1800 Gross per 24 hour  Intake 1679.54 ml  Output 2200 ml  Net -520.46 ml      09/27/2023   12:00 PM 09/15/2023    9:05 AM 04/26/2023   10:15 AM  Last 3 Weights  Weight (lbs) 218 lb 4.1 oz 212 lb 212 lb 4.9 oz  Weight (kg) 99 kg 96.163 kg 96.3 kg     Body mass index is 32.23 kg/m.  General: Well developed, well nourished AAF, in no  acute distress. Head: Normocephalic, atraumatic, sclera non-icteric, no xanthomas, nares are without discharge. Neck: Negative for carotid bruits. JVP not elevated. Lungs: Clear bilaterally to auscultation without wheezes, rales, or rhonchi. Breathing is unlabored. Heart: Irregularly irregular S1 S2 without murmurs, rubs, or gallops.  Abdomen: Soft, non-tender, non-distended with normoactive bowel sounds. No rebound/guarding. Extremities: No clubbing or cyanosis. No edema. Distal pedal pulses are 2+ and equal bilaterally. Neuro: Alert and oriented X 3. Moves all extremities spontaneously. Psych:  Responds to questions appropriately with a normal affect.   EKG:  The EKG was personally reviewed and demonstrates:  atrial fibrillation 93bpm, prior septal infarct, NSIVCD, nonspecific STTW changes Telemetry:  Telemetry was personally reviewed and demonstrates:  AF variable rates  Relevant CV Studies: 2d echo today   1. Left ventricular ejection fraction, by estimation, is 65 to 70%. The  left ventricle has normal function. The left ventricle has no regional  wall motion abnormalities. There is mild left ventricular hypertrophy.  Left ventricular diastolic function  could not be evaluated.   2. Right ventricular systolic function is normal. The right ventricular  size is normal. There is normal pulmonary artery systolic pressure.   3. The mitral valve is grossly normal. Trivial mitral valve  regurgitation.   4. The aortic valve is tricuspid. Aortic valve regurgitation is not  visualized.   5. The inferior vena cava is normal in size with  greater than 50%  respiratory variability, suggesting right atrial pressure of 3 mmHg.   Comparison(s): No prior Echocardiogram.    Laboratory Data: High Sensitivity Troponin:  No results for input(s): TROPONINIHS in the last 720 hours.   Chemistry Recent Labs  Lab 09/27/23 1210 09/27/23 1435 09/27/23 1523 09/28/23 1331  NA 150* 141 143 139  K  2.9* 4.0 3.6 3.6  CL 111 109  --  108  CO2  --  23  --  23  GLUCOSE 97 93  --  101*  BUN 8 10  --  7*  CREATININE 0.80 1.01*  --  1.06*  CALCIUM   --  8.7*  --  9.2  MG  --   --   --  1.8  GFRNONAA  --  59*  --  55*  ANIONGAP  --  9  --  8    Recent Labs  Lab 09/28/23 1331  PROT 7.0  ALBUMIN 3.3*  AST 25  ALT 18  ALKPHOS 59  BILITOT 0.9   Lipids  Recent Labs  Lab 09/28/23 0527 09/28/23 0528  CHOL 110  --   TRIG 53 55  HDL 43  --   LDLCALC 56  --   CHOLHDL 2.6  --     Hematology Recent Labs  Lab 09/27/23 1210 09/27/23 1435 09/27/23 1523  WBC  --  10.7*  --   RBC  --  4.40  --   HGB 12.2 11.8* 12.2  HCT 36.0 38.0 36.0  MCV  --  86.4  --   MCH  --  26.8  --   MCHC  --  31.1  --   RDW  --  14.7  --   PLT  --  173  --    Thyroid No results for input(s): TSH, FREET4 in the last 168 hours.  BNPNo results for input(s): BNP, PROBNP in the last 168 hours.  DDimer No results for input(s): DDIMER in the last 168 hours.  Radiology/Studies:  ECHOCARDIOGRAM COMPLETE Result Date: 09/28/2023    ECHOCARDIOGRAM REPORT   Patient Name:   TAEYA THEALL Date of Exam: 09/28/2023 Medical Rec #:  [id number]        Height:       69.0 in Accession #:    [id number]       Weight:       218.3 lb Date of Birth:  Dec 19, 1950        BSA:          2.144 m Patient Age:    73 years         BP:           144/124 mmHg Patient Gender: F                HR:           90 bpm. Exam Location:  Inpatient Procedure: 2D Echo, Intracardiac Opacification Agent, Cardiac Doppler and Color            Doppler (Both Spectral and Color Flow Doppler were utilized during            procedure). Indications:    Stroke  History:        Patient has no prior history of Echocardiogram examinations.                 Risk Factors:Hypertension and Former Smoker.  Sonographer:    Juliene Rucks Referring Phys: [id number] [facility] KHALIQDINA IMPRESSIONS  1. Left ventricular  ejection fraction, by estimation, is 65 to 70%. The left  ventricle has normal function. The left ventricle has no regional wall motion abnormalities. There is mild left ventricular hypertrophy. Left ventricular diastolic function could not be evaluated.  2. Right ventricular systolic function is normal. The right ventricular size is normal. There is normal pulmonary artery systolic pressure.  3. The mitral valve is grossly normal. Trivial mitral valve regurgitation.  4. The aortic valve is tricuspid. Aortic valve regurgitation is not visualized.  5. The inferior vena cava is normal in size with greater than 50% respiratory variability, suggesting right atrial pressure of 3 mmHg. Comparison(s): No prior Echocardiogram. FINDINGS  Left Ventricle: Left ventricular ejection fraction, by estimation, is 65 to 70%. The left ventricle has normal function. The left ventricle has no regional wall motion abnormalities. Definity  contrast agent was given IV to delineate the left ventricular  endocardial borders. The left ventricular internal cavity size was normal in size. There is mild left ventricular hypertrophy. Left ventricular diastolic function could not be evaluated due to atrial fibrillation. Left ventricular diastolic function could not be evaluated. Right Ventricle: The right ventricular size is normal. No increase in right ventricular wall thickness. Right ventricular systolic function is normal. There is normal pulmonary artery systolic pressure. The tricuspid regurgitant velocity is 2.78 m/s, and  with an assumed right atrial pressure of 3 mmHg, the estimated right ventricular systolic pressure is 33.9 mmHg. Left Atrium: Left atrial size was normal in size. Right Atrium: Right atrial size was normal in size. Pericardium: There is no evidence of pericardial effusion. Mitral Valve: The mitral valve is grossly normal. Trivial mitral valve regurgitation. Tricuspid Valve: The tricuspid valve is grossly normal. Tricuspid valve regurgitation is mild. Aortic Valve: The aortic  valve is tricuspid. Aortic valve regurgitation is not visualized. Pulmonic Valve: The pulmonic valve was normal in structure. Pulmonic valve regurgitation is not visualized. Aorta: The aortic root and ascending aorta are structurally normal, with no evidence of dilitation. Venous: The inferior vena cava is normal in size with greater than 50% respiratory variability, suggesting right atrial pressure of 3 mmHg. IAS/Shunts: The interatrial septum was not well visualized.  LEFT VENTRICLE PLAX 2D LVIDd:         4.50 cm     Diastology LVIDs:         3.10 cm     LV e' medial:    9.25 cm/s LV PW:         1.10 cm     LV E/e' medial:  11.8 LV IVS:        1.20 cm     LV e' lateral:   9.68 cm/s LVOT diam:     2.00 cm     LV E/e' lateral: 11.3 LV SV:         57 LV SV Index:   27 LVOT Area:     3.14 cm  LV Volumes (MOD) LV vol d, MOD A4C: 61.9 ml LV vol s, MOD A4C: 28.8 ml LV SV MOD A4C:     61.9 ml RIGHT VENTRICLE             IVC RV S prime:     13.90 cm/s  IVC diam: 1.40 cm LEFT ATRIUM           Index LA diam:      4.10 cm 1.91 cm/m LA Vol (A4C): 48.1 ml 22.43 ml/m  AORTIC VALVE LVOT Vmax:   119.00 cm/s LVOT Vmean:  78.100 cm/s  LVOT VTI:    0.183 m  AORTA Ao Root diam: 3.00 cm Ao Asc diam:  3.20 cm MITRAL VALVE                TRICUSPID VALVE MV Area (PHT): 3.15 cm     TR Peak grad:   30.9 mmHg MV Decel Time: 241 msec     TR Vmax:        278.00 cm/s MV E velocity: 109.00 cm/s                             SHUNTS                             Systemic VTI:  0.18 m                             Systemic Diam: 2.00 cm Vinie Maxcy MD Electronically signed by Vinie Maxcy MD Signature Date/Time: 09/28/2023/3:22:19 PM    Final    MR BRAIN WO CONTRAST Result Date: 09/28/2023 CLINICAL DATA:  73 year old female status post code stroke presentation yesterday. 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 TNK given, status post endovascular Basilar artery revascularization. EXAM: MRI HEAD WITHOUT CONTRAST TECHNIQUE: Multiplanar, multiecho pulse  sequences of the brain and surrounding structures were obtained without intravenous contrast. COMPARISON:  Head CT, CTA head and neck yesterday. Brain MRI 06/30/2022. FINDINGS: Brain: Patchy, confluent left PCA territory restricted diffusion, limited to the occipital lobe. Similar Patchy and confluent left SCA or AICA cerebellar restricted diffusion (series 14, image 47). 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 Confluent T2 and FLAIR hyperintensity in those areas. Punctate petechial hemorrhage possible in the medial left occipital lobe (series 12, image 26) but elsewhere on SWI numerous posterior circulation chronic microhemorrhages are new from the MRI last year. Heidelberg classification 1a: HI1, scattered small petechiae, no mass effect. Punctate area of diffusion restriction at the tail of the right hippocampus (series 5, image 69). No convincing brainstem restricted diffusion. No other restricted diffusion identified. Cerebral volume not significantly changed from last year. No midline shift, mass effect, evidence of mass lesion, ventriculomegaly, extra-axial collection. Cervicomedullary junction and pituitary are within normal limits. Widely scattered cerebral white matter T2 and FLAIR hyperintensity in the supratentorial brain not significantly changed. Pontine mild to moderate T2 and FLAIR heterogeneity appears stable to minimally increased. Vascular: Major intracranial vascular flow voids appear stable from the 2024 MRI. Skull and upper cervical spine: Sequelae of cervical spine ACDF. Visualized bone marrow signal is within normal limits. Sinuses/Orbits: Stable, negative. Other: Mastoids well aerated.  Negative visible scalp and face. IMPRESSION: 1. Patchy, relatively small infarcts in the Left cerebellum and Left occipital lobe. Punctate infarct at the tail of the right hippocampus. 2. Although difficult to exclude Heidelberg classification 1a: HI1  petechial blood in the affected Left PCA territory, could instead be chronic as numerous other chronic microhemorrhages in the posterior circulation also appear new from SWI last year. 3. No intracranial mass effect. And otherwise stable from last year chronic signal changes in the bilateral cerebral white matter, pons, left cerebellum. Electronically Signed   By: VEAR Hurst M.D.   On: 09/28/2023 13:12   IR PERCUTANEOUS ART THROMBECTOMY/INFUSION INTRACRANIAL INC DIAG ANGIO Result Date: 09/28/2023 INDICATION: Acute Large Vessel occlusion, EXAM: Mechanical thrombectomy, aspiration thrombectomy CONSENT: Consent was obtained by  telephone from the patient's daughter MEDICATIONS: No additional ANESTHESIA/SEDATION: General CONTRAST:  Thirty ml Omnipaque  300 FLUOROSCOPY: Radiation Exposure Index (as provided by the fluoroscopic device): 325 MGy reference air Kerma COMPLICATIONS: None immediate. TECHNIQUE: Maximal Sterile Barrier Technique was utilized including caps, mask, sterile gowns, sterile gloves, sterile drape, hand hygiene and skin antiseptic. A timeout was performed prior to the initiation of the procedure. FINDINGS: Left subclavian: The left vertebral artery origin is normal. There is sluggish flow in the left vertebral artery. 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 Post thrombectomy left vertebral: There is normal flow in the left vertebral artery and basilar artery with no residual basilar thrombus. There is a normal cerebellar blush. The left posterior cerebral artery fills from the vertebral injection. There is flash flow in the right posterior cerebral artery. Right carotid post thrombectomy: The carotid artery, anterior, middle and posterior cerebral arteries are normal. There is a large posterior communicating artery supplying the right posterior cerebral artery. PROCEDURE: Femoral access was obtained with ultrasound using direct visualization of the needle puncture into the  artery. A 80 cm neuron max sheath was placed. The neuron max was advanced over a Berenstein catheter into the left vertebral artery. The Red 72 Sendit catheter was advanced over a synchro support wire to the level of the occlusion and connected to suction. The catheter was then withdrawn under aspiration. This resulted in aspiration of large amount of thrombus. Follow-up arteriogram showed an embolus occluding the distal basilar artery. The Red 72 Sendit catheter was readvanced over a synchro support wire to the level of the occlusion and connected to suction. The catheter was then withdrawn under aspiration. This resulted in aspiration of the embolus. The follow-up arteriogram now demonstrated normal flow in the left vertebral artery, basilar artery, cerebellar arteries and left posterior cerebral artery. There was flash filling of the right posterior cerebral artery. The catheters were removed from the left vertebral artery and the Berenstein catheter was placed into the right common carotid artery for study of the cerebral circulation to identify the right posterior cerebral artery. This was found to be coming from the right carotid artery and was normal. Hemostasis was obtained with Angio-Seal. IMPRESSION: Large basilar embolus removed with embolectomy PLAN: Patient left intubated and admitted to ICU for observation Electronically Signed   By: Nancyann Burns M.D.   On: 09/28/2023 10:30   IR US  Guide Vasc Access Right Result Date: 09/28/2023 INDICATION: Acute Large Vessel occlusion, EXAM: Mechanical thrombectomy, aspiration thrombectomy CONSENT: Consent was obtained by telephone from the patient's daughter MEDICATIONS: No additional ANESTHESIA/SEDATION: General CONTRAST:  Thirty ml Omnipaque  300 FLUOROSCOPY: Radiation Exposure Index (as provided by the fluoroscopic device): 325 MGy reference air Kerma COMPLICATIONS: None immediate. TECHNIQUE: Maximal Sterile Barrier Technique was utilized including caps, mask,  sterile gowns, sterile gloves, sterile drape, hand hygiene and skin antiseptic. A timeout was performed prior to the initiation of the procedure. FINDINGS: Left subclavian: The left vertebral artery origin is normal. There is sluggish flow in the left vertebral artery. 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 Post thrombectomy left vertebral: There is normal flow in the left vertebral artery and basilar artery with no residual basilar thrombus. There is a normal cerebellar blush. The left posterior cerebral artery fills from the vertebral injection. There is flash flow in the right posterior cerebral artery. Right carotid post thrombectomy: The carotid artery, anterior, middle and posterior cerebral arteries are normal. There is a large posterior  communicating artery supplying the right posterior cerebral artery. PROCEDURE: Femoral access was obtained with ultrasound using direct visualization of the needle puncture into the artery. A 80 cm neuron max sheath was placed. The neuron max was advanced over a Berenstein catheter into the left vertebral artery. The Red 72 Sendit catheter was advanced over a synchro support wire to the level of the occlusion and connected to suction. The catheter was then withdrawn under aspiration. This resulted in aspiration of large amount of thrombus. Follow-up arteriogram showed an embolus occluding the distal basilar artery. The Red 72 Sendit catheter was readvanced over a synchro support wire to the level of the occlusion and connected to suction. The catheter was then withdrawn under aspiration. This resulted in aspiration of the embolus. The follow-up arteriogram now demonstrated normal flow in the left vertebral artery, basilar artery, cerebellar arteries and left posterior cerebral artery. There was flash filling of the right posterior cerebral artery. The catheters were removed from the left vertebral artery and the Berenstein catheter was placed  into the right common carotid artery for study of the cerebral circulation to identify the right posterior cerebral artery. This was found to be coming from the right carotid artery and was normal. Hemostasis was obtained with Angio-Seal. IMPRESSION: Large basilar embolus removed with embolectomy PLAN: Patient left intubated and admitted to ICU for observation Electronically Signed   By: Nancyann Burns M.D.   On: 09/28/2023 10:30   IR ANGIO EXTRACRAN SEL COM CAROTID INNOMINATE UNI R MOD SED Result Date: 09/28/2023 INDICATION: Acute Large Vessel occlusion, EXAM: Mechanical thrombectomy, aspiration thrombectomy CONSENT: Consent was obtained by telephone from the patient's daughter MEDICATIONS: No additional ANESTHESIA/SEDATION: General CONTRAST:  Thirty ml Omnipaque  300 FLUOROSCOPY: Radiation Exposure Index (as provided by the fluoroscopic device): 325 MGy reference air Kerma COMPLICATIONS: None immediate. TECHNIQUE: Maximal Sterile Barrier Technique was utilized including caps, mask, sterile gowns, sterile gloves, sterile drape, hand hygiene and skin antiseptic. A timeout was performed prior to the initiation of the procedure. FINDINGS: Left subclavian: The left vertebral artery origin is normal. There is sluggish flow in the left vertebral artery. 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 Post thrombectomy left vertebral: There is normal flow in the left vertebral artery and basilar artery with no residual basilar thrombus. There is a normal cerebellar blush. The left posterior cerebral artery fills from the vertebral injection. There is flash flow in the right posterior cerebral artery. Right carotid post thrombectomy: The carotid artery, anterior, middle and posterior cerebral arteries are normal. There is a large posterior communicating artery supplying the right posterior cerebral artery. PROCEDURE: Femoral access was obtained with ultrasound using direct visualization of the needle  puncture into the artery. A 80 cm neuron max sheath was placed. The neuron max was advanced over a Berenstein catheter into the left vertebral artery. The Red 72 Sendit catheter was advanced over a synchro support wire to the level of the occlusion and connected to suction. The catheter was then withdrawn under aspiration. This resulted in aspiration of large amount of thrombus. Follow-up arteriogram showed an embolus occluding the distal basilar artery. The Red 72 Sendit catheter was readvanced over a synchro support wire to the level of the occlusion and connected to suction. The catheter was then withdrawn under aspiration. This resulted in aspiration of the embolus. The follow-up arteriogram now demonstrated normal flow in the left vertebral artery, basilar artery, cerebellar arteries and left posterior cerebral artery. There was flash filling of the  right posterior cerebral artery. The catheters were removed from the left vertebral artery and the Berenstein catheter was placed into the right common carotid artery for study of the cerebral circulation to identify the right posterior cerebral artery. This was found to be coming from the right carotid artery and was normal. Hemostasis was obtained with Angio-Seal. IMPRESSION: Large basilar embolus removed with embolectomy PLAN: Patient left intubated and admitted to ICU for observation Electronically Signed   By: Nancyann Burns M.D.   On: 09/28/2023 10:30   DG Abd Portable 1V Result Date: 09/27/2023 CLINICAL DATA:  Orogastric tube placement. EXAM: PORTABLE ABDOMEN - 1 VIEW COMPARISON:  Abdominopelvic CT 12/27/2018. FINDINGS: 1424 hours. Tip of the enteric tube overlies the L2 vertebral body, likely within the distal stomach. The visualized bowel gas pattern is nonobstructive. Mildly prominent colonic stool. There is contrast material within the renal collecting systems bilaterally attributed to previous CTA. Mild degenerative changes in the spine without acute  osseous abnormality. IMPRESSION: Enteric tube tip overlies the distal stomach. Electronically Signed   By: Elsie Perone M.D.   On: 09/27/2023 15:08   Portable Chest x-ray Result Date: 09/27/2023 CLINICAL DATA:  [id number] Endotracheal tube present [id number] EXAM: PORTABLE CHEST 1 VIEW COMPARISON:  Radiographs 07/13/2017 and 04/26/2023. Cardiac CT 11/25/2022. FINDINGS: Tip of the endotracheal tube is at the carina and should be withdrawn approximately 5 cm. An enteric tube projects below the diaphragm, tip not visualized on this examination. There are low lung volumes with mild resulting atelectasis at both lung bases. No confluent airspace disease, pneumothorax or significant pleural effusion identified. The heart size and mediastinal contours are stable with aortic atherosclerosis. Patient is status post lower cervical fusion. No acute osseous findings are evident. IMPRESSION: 1. Tip of the endotracheal tube is at the carina and should be withdrawn approximately 5 cm. 2. Low lung volumes with mild bibasilar atelectasis. 3. These results will be called to the ordering clinician or representative by the Radiologist Assistant, and communication documented in the PACS or [employer]. Electronically Signed   By: Elsie Perone M.D.   On: 09/27/2023 15:06   CT ANGIO HEAD NECK W WO CM (CODE STROKE) Result Date: 09/27/2023 EXAM: CTA Head and Neck with Intravenous Contrast. CLINICAL HISTORY: Neuro deficit, acute, stroke suspected. Left sided gaze. AMS TECHNIQUE: Axial CTA images of the head and neck performed with intravenous contrast. MIP reconstructed images were created and reviewed. Axial computed tomography images of the head/brain performed without intravenous contrast. Note: Per PQRS, the description of internal carotid artery percent stenosis, including 0 percent or normal exam, is based on [place] American Symptomatic Carotid Endarterectomy Trial (NASCET) criteria. Dose reduction technique was used including  one or more of the following: automated exposure control, adjustment of mA and kV according to patient size, and/or iterative reconstruction. CONTRAST: Without and with; 75mL iohexol  (OMNIPAQUE ) 350 MG/ML injection COMPARISON: CT head same day FINDINGS: CTA NECK: COMMON CAROTID ARTERIES: Mild-to-moderate atherosclerosis of the visualized aortic arch involving the origin of the left subclavian artery without significant stenosis. INTERNAL CAROTID ARTERIES: Mild calcified atherosclerosis at the right carotid bifurcation without hemodynamically significant stenosis. Minimal focal atherosclerosis at the left carotid bifurcation without hemodynamically significant stenosis. VERTEBRAL ARTERIES: The left vertebral artery is dominant. Atherosclerosis at the vertebral artery origins. There is mild stenosis at the right vertebral artery origin. There is mild tortuosity of the left V1 segment. The vertebral arteries are patent from the origins to the proximal V4 segments. There is diminished contrast within the  vertebral arteries within the V3 and proximal V4 segments. There is occlusion of the V4 segment of the right vertebral artery likely distal to the origin of the right PICA. There is tapering and significantly diminished contrast within the distal left V4 segment. CTA HEAD: The intracranial internal carotid arteries are patent bilaterally. Atherosclerosis of the bilateral carotid siphons with mild stenosis of the bilateral cavernous segments. ANTERIOR CEREBRAL ARTERIES: The anterior cerebral arteries are patent bilaterally. MIDDLE CEREBRAL ARTERIES: The middle cerebral arteries are patent bilaterally. POSTERIOR CEREBRAL ARTERIES: The right posterior cerebral artery is supplied via the right posterior communicating artery and appears patent although somewhat diminished in caliber. There is reconstitution of the P1 segment of the left PCA. There is severe stenosis of the P2P segment of the left PCA. There are patent  parieto-occipital branches of the left PCA. There is likely occlusion of the calcarine artery on the left. BASILAR ARTERY: Occlusion of the proximal basilar artery. OTHER: There is faint contrast within the proximal aspect of the superior cerebellar arteries. The left superior cerebellar artery is occluded proximally. The right superior cerebellar artery appears patent but diminished in caliber. There is diminished contrast within the posterior inferior cerebellar arteries bilaterally. The left PICA origin is possibly extradural. SOFT TISSUES: No acute finding. No masses or lymphadenopathy. BONES: Degenerative changes in the visualized spine. Anterior cervical fusion hardware from C3 to C5. IMPRESSION: 1. 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 2. 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 3. 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 The right superior cerebellar artery appears patent but significantly diminished in caliber. 4. 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 Tapering and significantly diminished contrast within the distal left v4 segment. 5. Mild-to-moderate atherosclerosis of the visualized aortic arch involving the origin of the left subclavian artery. 6. Findings discussed with Dr. Vanessa at 12:20PM on 09/27/23. Electronically signed by: Donnice Mania MD 09/27/2023 12:51 PM EDT RP Workstation: [id number]   CT HEAD CODE STROKE WO CONTRAST Result Date: 09/27/2023 EXAM: CT HEAD WITHOUT CONTRAST 09/27/2023 12:12:00 PM TECHNIQUE: CT of the head was performed without the administration of intravenous contrast. Automated exposure control, iterative reconstruction, and/or weight based adjustment of the mA/kV was utilized to reduce the radiation dose to as low as reasonably achievable. COMPARISON: CT head 48 25 CLINICAL HISTORY: Neuro deficit,  acute, stroke suspected. Aphasia. Left sided gaze. FINDINGS: BRAIN AND VENTRICLES: Nonspecific hypoattenuation in the periventricular and subcortical white matter, most likely representing chronic small vessel disease. Similar volume loss similar prominence of the ventricles. ORBITS: Bilateral lens replacement. SINUSES: No acute abnormality. SOFT TISSUES AND SKULL: No acute soft tissue abnormality. No skull fracture. VASCULATURE: Atherosclerosis involving the carotid siphons. [place] stroke program early CT (ASPECT) score: Ganglionic (caudate, IC, lentiform nucleus, insula, M1-M3): 7. Supraganglionic (M4-M6): 3. Total: 10. IMPRESSION: 1. No acute intracranial abnormality. 2. Findings messaged to Dr. Vanessa at 12:19PM on 09/27/23. Electronically signed by: Donnice Mania MD 09/27/2023 12:20 PM EDT RP Workstation: [id number]     Assessment and Plan:  1. Newly recognized atrial fibrillation, here with acute stroke - HR intermittently 120s prior to our arrival, received 10mg  IV labetalol  at 1725 with improvement in HR to 80s - patient reports some awareness of atrial fibrillation in a sense that she feels like she needs to cough, but no clear palpitations - echocardiogram  shows normal LV function - patient was on carvedilol  25mg  BID PTA - per d/w Dr. Shlomo, will start metoprolol  25mg  q8hr (place hold parameters for today) and titrate up tomorrow as we are able - neuro team has started Pradaxa  chosen due to cost - will need outpatient follow-up with her primary cardiologist to discuss plan for rate vs rhythm control, would not acutely pursue DCCV in setting of acute stroke - check TSH in AM  2. CAD, episodic chest pain - mild-moderate disease by cath 12/2022, recommended for medical mgmt - pt reports chronic fleeting chest pains though more frequent over the last 6 months lasting seconds at a time a few times a day - sounds somewhat atypical but there is a minor exertional component. Would recommend  medical mgmt for now given her acute admission for stroke with outpatient cardiology follow-up once on intended regimen - not on ASA given concomitant Pradaxa  for now  3. Essential HTN - home regimen includes amlodipine  10mg  daily (resumed here), lisinopril /hydrochlorothiazide  20/25mg  daily (lisinopril  20mg  resumed here), carvedilol  25mg  BID (not yet resumed), imdur 30mg  QAM (not yet resumed) - will defer HTN mgmt to stroke team as it pertains to their goals - can f/u for resumption of Imdur in AM  Remainder per primary  Risk Assessment/Risk Scores:      CHA2DS2-VASc Score = 6   This indicates a 9.7% annual risk of stroke. The patient's score is based upon: CHF History: 0 HTN History: 1 Diabetes History: 0 Stroke History: 2 Vascular Disease History: 1 Age Score: 1 Gender Score: 1      For questions or updates, please contact  [facility] Please consult [email or web address] for contact info under    Signed, Don Tiu N Jarrod Bodkins, PA-C  09/28/2023 6:12 PM

## 2023-09-28 NOTE — Telephone Encounter (Addendum)
 Patient Product/process development scientist completed.    The patient is insured through [employer].     Ran test claim for Eliquis  5 mg and the current 30 day co-pay is $105.58.  Ran test claim for Xarelto 20 mg and the current 30 day co-pay is $104.15  Ran test claim for dabigatran  (Pradaxa ) 150 mg and the current 30 day co-pay is $6.63.   This test claim was processed through [facility]- copay amounts may vary at other pharmacies due to pharmacy/plan contracts, or as the patient moves through the different stages of their insurance plan.     Reyes Sharps, CPHT Pharmacy Technician III Certified Patient Advocate [facility] Patient Advocate Team Direct Number: [phone]  Fax: [phone]

## 2023-09-28 NOTE — Progress Notes (Addendum)
 PHARMACY - ANTICOAGULATION CONSULT NOTE  Pharmacy Consult for eliquis  Indication: atrial fibrillation  Allergies  Allergen Reactions   Pantoprazole  Anaphylaxis   Codeine Hives   Penicillins Hives   Sulfa Antibiotics Hives    Patient Measurements: Height: 5' 9 (175.3 cm) Weight: 99 kg (218 lb 4.1 oz) IBW/kg (Calculated) : 66.2 HEPARIN  DW (KG): 87.6  Vital Signs: Temp: 98.5 F (36.9 C) (09/10 1200) Temp Source: Axillary (09/10 1200) BP: 141/81 (09/10 1445) Pulse Rate: 108 (09/10 1445)  Labs: Recent Labs    09/27/23 1210 09/27/23 1435 09/27/23 1523 09/28/23 1331  HGB 12.2 11.8* 12.2  --   HCT 36.0 38.0 36.0  --   PLT  --  173  --   --   CREATININE 0.80 1.01*  --  1.06*    Estimated Creatinine Clearance: 59.2 mL/min (A) (by C-G formula based on SCr of 1.06 mg/dL (H)).   Assessment: 73 yo F admitted with stroke with new atrial fibrillation detected on EKG. Pharmacy consulted to start eliquis  for anticoagulation.  Pt 73 yo, 99kg, Scr 1.06 - does not qualify for dose reduction  Goal of Therapy:  Monitor platelets by anticoagulation protocol: Yes   Plan:  Eliquis  5 mg BID for afib Copay check pending with TOC team F/u CBC, s/sx bleeding  Sharyne Glatter, PharmD, BCCCP Critical Care Clinical Pharmacist 09/28/2023 3:13 PM   --- Copay check resulted as $105/mo foEliquis  and $104/mo for Xarelto Copay check for pradaxa  resulted as $6/mo will proceed w dabigatran  for afib  Pradaxa  by mouth 150mg  BID  F/u s/sx bleeding, and CBC  Sharyne Glatter, PharmD, BCCCP Critical Care Clinical Pharmacist 09/28/2023 3:34 PM

## 2023-09-28 NOTE — Evaluation (Signed)
 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   Status post total knee replacement using cement, right 07/01/2020   Lumbar stenosis 02/04/2020   Status post total knee replacement using cement, left 07/26/2017   Gait disturbance 05/25/2016   Abnormal brain MRI 05/25/2016   Memory loss 08/28/2015   Incontinence 05/06/2015   Numbness 11/25/2014   Insomnia 08/01/2014   Abdominal pain, left upper quadrant 07/16/2014   Abnormal finding on ultrasound 07/16/2014   Fatty liver disease, nonalcoholic 07/16/2014   Small intestinal bacterial overgrowth 07/16/2014   Asthma without status asthmaticus 06/20/2014   HLD (hyperlipidemia) 06/20/2014   Dermatitis, eczematoid 06/20/2014   Acid reflux 06/20/2014   Cephalalgia 06/20/2014   BP (high blood pressure) 06/20/2014   Adiposity 06/20/2014   Arthritis, degenerative 06/20/2014   Allergic rhinitis, seasonal 06/20/2014   Proximal limb weakness 06/20/2014   Proximal leg weakness 06/20/2014   Disturbed cognition 06/20/2014   Carpal tunnel syndrome 06/20/2014   Tinnitus 06/20/2014   Past Medical History:  Past Medical History:  Diagnosis Date   Arthritis    everywhere   Asthma    Bipolar disorder (HCC)    GERD (gastroesophageal reflux disease)    Hearing loss    History of hiatal hernia 2011   treated with medication, no surgical repair   History of Sjogren's disease (HCC)    HLD (hyperlipidemia)    Hypertension    Hypothyroidism    per patient, not taking synthroid  anymore   Incomplete bladder emptying    Memory loss    Obesity    Recurrent UTI    Ringing in ear, bilateral    Vision abnormalities    Past Surgical History:  Past Surgical History:  Procedure Laterality Date    ABDOMINAL HYSTERECTOMY     BACK SURGERY     x 3   CARPAL TUNNEL RELEASE Right    COLONOSCOPY     EXCISION OF TONGUE LESION N/A 12/19/2014   Procedure: EXCISION OF TONGUE LESION;  Surgeon: Carolee Hunter, MD;  Location: [facility];  Service: ENT;  Laterality: N/A;   EYE SURGERY     cataracts   FOOT SURGERY Right    lump on bottom of toe. removed and straightened toes   IR ANGIO EXTRACRAN SEL COM CAROTID INNOMINATE UNI R MOD SED  09/27/2023   IR PERCUTANEOUS ART THROMBECTOMY/INFUSION INTRACRANIAL INC DIAG ANGIO  09/27/2023   IR US  GUIDE VASC ACCESS RIGHT  09/27/2023   JOINT REPLACEMENT Left    tkr   LEFT HEART CATH AND CORONARY ANGIOGRAPHY Left 12/29/2022   Procedure: LEFT HEART CATH AND CORONARY ANGIOGRAPHY;  Surgeon: Florencio Cara BIRCH, MD;  Location: [facility];  Service: Cardiovascular;  Laterality: Left;   LUMBAR LAMINECTOMY/DECOMPRESSION MICRODISCECTOMY Right 02/04/2020   Procedure: L2-4 OPEN LAMINECTOMY, RIGHT L5/S1 HEMILAMINECTOMY;  Surgeon: Bluford Standing, MD;  Location: [facility];  Service: Neurosurgery;  Laterality: Right;   NECK SURGERY     cadaver tissue used for cervical surgery   RADIOLOGY WITH ANESTHESIA N/A 09/27/2023   Procedure: RADIOLOGY WITH ANESTHESIA;  Surgeon: Radiologist, Medication, MD;  Location: [facility];  Service: Radiology;  Laterality: N/A;   SHOULDER ARTHROSCOPY WITH OPEN ROTATOR CUFF REPAIR Left 01/16/2015   Procedure: SHOULDER ARTHROSCOPY WITH mini OPEN ROTATOR CUFF REPAIR,subacromial decompression, abrasion  chondrop;asty with microfracture of glenoid, excision distal clavicle, biceps tenodesis, extensive debridement;  Surgeon: Norleen JINNY Maltos, MD;  Location: [facility];  Service: Orthopedics;  Laterality: Left;   TOTAL KNEE ARTHROPLASTY Left 07/26/2017   Procedure: TOTAL KNEE ARTHROPLASTY;  Surgeon: Maltos Norleen JINNY, MD;  Location: [facility];  Service: Orthopedics;  Laterality: Left;   TOTAL KNEE ARTHROPLASTY Right 07/01/2020   Procedure: TOTAL KNEE ARTHROPLASTY;   Surgeon: Maltos Norleen JINNY, MD;  Location: [facility];  Service: Orthopedics;  Laterality: Right;   VESICOVAGINAL FISTULA CLOSURE W/ TAH     HPI:  Jessica Norman is a 73 yo female presenting to ED 9/9 with aphasia, L gaze deviation, L facial droop, and vomiting after sudden onset headache with fall. 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 Remained intubated after the procedure, <12 hours. PMH includes bipolar disorder, HTN, HLD, GERD, hypothyroidism   Assessment / Plan / Recommendation Clinical Impression  Pt and her daughter endorse a history of memory impairment, though pt remains independent at home. She initially presented with aphasia, which has since resolved. She presents wtih deficits related to memory and problem solving as evidenced through administered portions of the Cognistat. She could immediately recall four novel words but needed semantic cueing after a delay. Performing addition and multiplication was exceptionally challening with difficulty verbalizing the steps to explain her answers. While memory deficits may be acute on chronic, pt states noted difficulty with problem solving tasks is an acute change. Recommend ongoing SLP f/u given previous level of independence.    SLP Assessment  SLP Recommendation/Assessment: Patient needs continued Speech Language Pathology Services SLP Visit Diagnosis: Cognitive communication deficit (R41.841)     Assistance Recommended at Discharge  Set up Supervision/Assistance  Functional Status Assessment Patient has had a recent decline in their functional status and demonstrates the ability to make significant improvements in function in a reasonable and predictable amount of time.  Frequency and Duration min 1 x/week  2 weeks      SLP Evaluation Cognition  Overall Cognitive Status: History of cognitive impairments - at baseline Arousal/Alertness: Awake/alert Orientation Level: Oriented X4 Attention:  Sustained Sustained Attention: Appears intact Memory: Impaired Memory Impairment: Retrieval deficit Awareness: Appears intact Problem Solving: Impaired Problem Solving Impairment: Verbal basic Executive Function: Reasoning Reasoning: Appears intact       Comprehension  Auditory Comprehension Overall Auditory Comprehension: Appears within functional limits for tasks assessed    Expression Expression Primary Mode of Expression: Verbal Verbal Expression Overall Verbal Expression: Appears within functional limits for tasks assessed   Oral / Motor  Oral Motor/Sensory Function Overall Oral Motor/Sensory Function: Within functional limits Motor Speech Overall Motor Speech: Appears within functional limits for tasks assessed            Damien Blumenthal, M.A., CCC-SLP Speech Language Pathology, Acute Rehabilitation Services  Secure Chat preferred [phone]  09/28/2023, 11:29 AM

## 2023-09-28 NOTE — Progress Notes (Signed)
  Echocardiogram 2D Echocardiogram has been performed.  Jessica Norman 09/28/2023, 10:41 AM

## 2023-09-28 NOTE — Progress Notes (Signed)
 PT Cancellation Note  Patient Details Name: Jessica Norman MRN: [id number] DOB: 12/24/1950   Cancelled Treatment:    Reason Eval/Treat Not Completed: Active bedrest order  Aleck Daring, PT, DPT Acute Rehabilitation Services Office [phone]    [name] 09/28/2023, 7:46 AM

## 2023-09-28 NOTE — Plan of Care (Signed)
  Problem: Education: Goal: Knowledge of disease or condition will improve Outcome: Progressing Goal: Knowledge of secondary prevention will improve (MUST DOCUMENT ALL) Outcome: Progressing Goal: Knowledge of patient specific risk factors will improve (DELETE if not current risk factor) Outcome: Progressing   Problem: Ischemic Stroke/TIA Tissue Perfusion: Goal: Complications of ischemic stroke/TIA will be minimized Outcome: Progressing   Problem: Coping: Goal: Will verbalize positive feelings about self Outcome: Progressing Goal: Will identify appropriate support needs Outcome: Progressing   Problem: Health Behavior/Discharge Planning: Goal: Ability to manage health-related needs will improve Outcome: Progressing Goal: Goals will be collaboratively established with patient/family Outcome: Progressing   Problem: Self-Care: Goal: Ability to participate in self-care as condition permits will improve Outcome: Progressing Goal: Verbalization of feelings and concerns over difficulty with self-care will improve Outcome: Progressing Goal: Ability to communicate needs accurately will improve Outcome: Progressing   Problem: Nutrition: Goal: Risk of aspiration will decrease Outcome: Progressing Goal: Dietary intake will improve Outcome: Progressing   Problem: Education: Goal: Knowledge of General Education information will improve Description: Including pain rating scale, medication(s)/side effects and non-pharmacologic comfort measures Outcome: Progressing   Problem: Health Behavior/Discharge Planning: Goal: Ability to manage health-related needs will improve Outcome: Progressing   Problem: Clinical Measurements: Goal: Ability to maintain clinical measurements within normal limits will improve Outcome: Progressing Goal: Will remain free from infection Outcome: Progressing Goal: Diagnostic test results will improve Outcome: Progressing Goal: Respiratory complications will  improve Outcome: Progressing Goal: Cardiovascular complication will be avoided Outcome: Progressing   Problem: Activity: Goal: Risk for activity intolerance will decrease Outcome: Progressing   Problem: Nutrition: Goal: Adequate nutrition will be maintained Outcome: Progressing   Problem: Coping: Goal: Level of anxiety will decrease Outcome: Progressing   Problem: Elimination: Goal: Will not experience complications related to bowel motility Outcome: Progressing Goal: Will not experience complications related to urinary retention Outcome: Progressing   Problem: Pain Managment: Goal: General experience of comfort will improve and/or be controlled Outcome: Progressing   Problem: Safety: Goal: Ability to remain free from injury will improve Outcome: Progressing   Problem: Skin Integrity: Goal: Risk for impaired skin integrity will decrease Outcome: Progressing   Problem: Education: Goal: Understanding of CV disease, CV risk reduction, and recovery process will improve Outcome: Progressing Goal: Individualized Educational Video(s) Outcome: Progressing   Problem: Activity: Goal: Ability to return to baseline activity level will improve Outcome: Progressing   Problem: Cardiovascular: Goal: Ability to achieve and maintain adequate cardiovascular perfusion will improve Outcome: Progressing Goal: Vascular access site(s) Level 0-1 will be maintained Outcome: Progressing   Problem: Health Behavior/Discharge Planning: Goal: Ability to safely manage health-related needs after discharge will improve Outcome: Progressing   Problem: Activity: Goal: Ability to tolerate increased activity will improve Outcome: Progressing   Problem: Respiratory: Goal: Ability to maintain a clear airway and adequate ventilation will improve Outcome: Progressing   Problem: Role Relationship: Goal: Method of communication will improve Outcome: Progressing

## 2023-09-28 NOTE — Progress Notes (Signed)
 OT Cancellation Note  Patient Details Name: Jessica Norman MRN: [id number] DOB: [birth year]   Cancelled Treatment:    Reason Eval/Treat Not Completed: Active bedrest order  Josep Luviano K, OTD, OTR/L SecureChat Preferred Acute Rehab [phone]   Jessica Norman 09/28/2023, 7:17 AM

## 2023-09-28 NOTE — Consult Note (Signed)
 NAME:  Jessica Norman, MRN:  [id number], DOB:  Apr 22, 1950, LOS: 1 ADMISSION DATE:  09/27/2023, CONSULTATION DATE:  09/27/23 REFERRING MD:  Sal, CHIEF COMPLAINT:  HA    History of Present Illness:  73 yo F PMH bipolar disorder, HTN HLD GERD Hypothyroidism who presented w HA L gaze deviation L facial droop and vomiting. Was walking to bathroom at 1038am 9/9 and had sudden onset sev HA and fell to ground, prompting husband to call EMS.  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  She received tnkase  in ED Was subsequently taken for basilar artery embolectomy  Remains intubated after case and admitted to ICU  PCCM consulted in this setting    Pertinent  Medical History  Bipoalr disorder HLD HTN GERD Obesity Hypothryoidism  Sjogrens  Fatty liver  Prior L2-L4 laminectomy   Significant Hospital Events: Including procedures, antibiotic start and stop dates in addition to other pertinent events   9/9 basilar artery occlusion. Tnkase  then thrombectomy. Remains intubated after case, ICU after. PCCM consult   Interim History / Subjective:   Extubated last night Feeling well this morning, no acute complaints. Family at bedside, all questions answered  Objective    Blood pressure 115/65, pulse 100, temperature 99.4 F (37.4 C), temperature source Oral, resp. rate 14, height 5' 9 (1.753 m), weight 99 kg, SpO2 96%.    Vent Mode: PRVC FiO2 (%):  [40 %-100 %] 40 % Set Rate:  [18 bmp] 18 bmp Vt Set:  [520 mL] 520 mL PEEP:  [5 cmH20] 5 cmH20 Plateau Pressure:  [22 cmH20] 22 cmH20   Intake/Output Summary (Last 24 hours) at 09/28/2023 0734 Last data filed at 09/28/2023 9367 Gross per 24 hour  Intake 1533.85 ml  Output 1200 ml  Net 333.85 ml   Filed Weights   09/27/23 1200  Weight: 99 kg    Examination: General: elderly woman, no acute distress HENT: [place]/AT, moist mucous membranes Lungs: clear to auscultation, no  wheezing Cardiovascular: rrr, no murmurs Abdomen: soft ndnt  Extremities: no edema, warm Neuro: alert oriented, moving all extremities, 5/5 strength throughout GU: no foley  Resolved problem list   Assessment and Plan   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 Her baseline dysphagia sounds like it is r/t moisture related  -pepcid    Bipolar disorder Chronic pain  P -holding gabapentin, zoloft , seroquel , VPA -- pending med rec   PCCM will sign off.  Labs   CBC: Recent Labs  Lab 09/27/23 1210 09/27/23 1435 09/27/23 1523  WBC  --  10.7*  --   HGB 12.2 11.8* 12.2  HCT 36.0 38.0 36.0  MCV  --  86.4  --   PLT  --  173  --     Basic Metabolic Panel: Recent Labs  Lab 09/27/23 1210 09/27/23 1435 09/27/23 1523  NA 150* 141 143  K 2.9*  4.0 3.6  CL 111 109  --   CO2  --  23  --   GLUCOSE 97 93  --   BUN 8 10  --   CREATININE 0.80 1.01*  --   CALCIUM   --  8.7*  --    GFR: Estimated Creatinine Clearance: 62.1 mL/min (A) (by C-G formula based on SCr of 1.01 mg/dL (H)). Recent Labs  Lab 09/27/23 1435  WBC 10.7*    Liver Function Tests: No results for input(s): AST, ALT, ALKPHOS, BILITOT, PROT, ALBUMIN in the last 168 hours. No results for input(s): LIPASE, AMYLASE in the last 168 hours. No results for input(s): AMMONIA in the last 168 hours.  ABG    Component  Value Date/Time   PHART 7.397 09/27/2023 1523   PCO2ART 40.7 09/27/2023 1523   PO2ART 469 (H) 09/27/2023 1523   HCO3 25.1 09/27/2023 1523   TCO2 26 09/27/2023 1523   O2SAT 100 09/27/2023 1523     Coagulation Profile: No results for input(s): INR, PROTIME in the last 168 hours.  Cardiac Enzymes: No results for input(s): CKTOTAL, CKMB, CKMBINDEX, TROPONINI in the last 168 hours.  HbA1C: Hgb A1c MFr Bld  Date/Time Value Ref Range Status  09/27/2023 02:36 PM 4.9 4.8 - 5.6 % Final    Comment:    (NOTE) Diagnosis of Diabetes The following HbA1c ranges recommended by the American Diabetes Association (ADA) may be used as an aid in the diagnosis of diabetes mellitus.  Hemoglobin             Suggested A1C NGSP%              Diagnosis  <5.7                   Non Diabetic  5.7-6.4                Pre-Diabetic  >6.4                   Diabetic  <7.0                   Glycemic control for                       adults with diabetes.      CBG: No results for input(s): GLUCAP in the last 168 hours.  Critical care time: n/a     Dorn Chill, MD [facility] Office: [phone]   See Amion for personal pager PCCM on call pager [phone] until 7pm. Please call Elink 7p-7a. [phone]

## 2023-09-28 NOTE — Progress Notes (Signed)
 eLink Physician-Brief Progress Note Patient Name: Jessica Norman DOB: [birth year] MRN: [id number]   Date of Service  09/28/2023  HPI/Events of Note  Nausea and vomiting, QTC 450.  eICU Interventions  PRN Zofran  ordered.        [name] [name] 09/28/2023, 8:04 PM

## 2023-09-28 NOTE — Evaluation (Addendum)
 Physical Therapy Evaluation Patient Details Name: Jessica Norman MRN: [id number] DOB: 08/18/1950 Today's Date: 09/28/2023  History of Present Illness  Pt is a 73 y/o F presenting ot ED on 9/9 as code stroke with aphasia, L gaze and facial droop, fall, CT head with hyperdense basil artery occlusion of L SCA and occlusion of R vertebral artery V4 segment and singificantly diminished L V4 vertebral artery. S/p TNK and embolectomy on 9/9. PMH includes GERD, bipolar disorder, HTN, HLD, hypothyroidism, obesity.  Clinical Impression  PTA, pt lives with her spouse, is independent with ADL's and uses cane for RW for ambulation. Pt currently requiring min assist for transfers and CGA for limited room ambulation using RW. Pt deferred further activity due to headache. No focal deficits appreciated, but pt with functional weakness, decreased activity tolerance and dynamic instability. Will benefit from HHPT follow up.        If plan is discharge home, recommend the following: A little help with bathing/dressing/bathroom;Assistance with cooking/housework;Assist for transportation;Help with stairs or ramp for entrance;A little help with walking and/or transfers   Can travel by private vehicle        Equipment Recommendations None recommended by PT  Recommendations for Other Services       Functional Status Assessment Patient has had a recent decline in their functional status and demonstrates the ability to make significant improvements in function in a reasonable and predictable amount of time.     Precautions / Restrictions Precautions Precautions: Fall Precaution/Restrictions Comments: SBP <160 Restrictions Weight Bearing Restrictions Per Provider Order: No      Mobility  Bed Mobility Overal bed mobility: Needs Assistance Bed Mobility: Sit to Supine       Sit to supine: Supervision        Transfers Overall transfer level: Needs assistance Equipment used: Rolling walker (2  wheels) Transfers: Sit to/from Stand Sit to Stand: Min assist           General transfer comment: MinA to stand from chair, rocking to gain momentum    Ambulation/Gait Ambulation/Gait assistance: Contact guard assist Gait Distance (Feet): 10 Feet Assistive device: Rolling walker (2 wheels) Gait Pattern/deviations: Step-through pattern, Decreased stride length          Stairs            Wheelchair Mobility     Tilt Bed    Modified Rankin (Stroke Patients Only) Modified Rankin (Stroke Patients Only) Pre-Morbid Rankin Score: No significant disability Modified Rankin: Moderately severe disability     Balance Overall balance assessment: Needs assistance Sitting-balance support: Feet supported Sitting balance-Leahy Scale: Good Sitting balance - Comments: reaches outside BOS without LOB     Standing balance-Leahy Scale: Poor Standing balance comment: reliant on external support, brief static standing with unilateral UE support                             Pertinent Vitals/Pain Pain Assessment Pain Assessment: 0-10 Pain Score: 4  Faces Pain Scale: Hurts a little bit Pain Location: headache Pain Descriptors / Indicators: Headache Pain Intervention(s): Monitored during session, Patient requesting pain meds-RN notified    Home Living Family/patient expects to be discharged to:: Private residence Living Arrangements: Spouse/significant other Available Help at Discharge: Family;Available 24 hours/day Type of Home: House Home Access: Stairs to enter Entrance Stairs-Rails: Can reach both Entrance Stairs-Number of Steps: 3   Home Layout: Two level;Able to live on main level with bedroom/bathroom Home Equipment: Rolling  Walker (2 wheels);Cane - single point;BSC/3in1      Prior Function Prior Level of Function : Needs assist;Driving             Mobility Comments: SPC for community mobility, RW at times in the home ADLs Comments: ind with  ADL/IADL     Extremity/Trunk Assessment   Upper Extremity Assessment Upper Extremity Assessment: Defer to OT evaluation    Lower Extremity Assessment Lower Extremity Assessment: Overall WFL for tasks assessed    Cervical / Trunk Assessment Cervical / Trunk Assessment: Normal  Communication   Communication Communication: No apparent difficulties    Cognition Arousal: Alert Behavior During Therapy: Flat affect   PT - Cognitive impairments: Sequencing                         Following commands: Impaired Following commands impaired: Follows one step commands with increased time     Cueing Cueing Techniques: Verbal cues     General Comments General comments (skin integrity, edema, etc.): VSS on RA, see note for BP measures    Exercises     Assessment/Plan    PT Assessment Patient needs continued PT services  PT Problem List Decreased strength;Decreased activity tolerance;Decreased balance;Decreased mobility       PT Treatment Interventions Gait training;DME instruction;Stair training;Functional mobility training;Therapeutic activities;Therapeutic exercise;Balance training;Patient/family education    PT Goals (Current goals can be found in the Care Plan section)  Acute Rehab PT Goals Patient Stated Goal: improved headache PT Goal Formulation: With patient Time For Goal Achievement: 10/12/23 Potential to Achieve Goals: Good    Frequency Min 2X/week     Co-evaluation               AM-PAC PT 6 Clicks Mobility  Outcome Measure Help needed turning from your back to your side while in a flat bed without using bedrails?: None Help needed moving from lying on your back to sitting on the side of a flat bed without using bedrails?: A Little Help needed moving to and from a bed to a chair (including a wheelchair)?: A Little Help needed standing up from a chair using your arms (e.g., wheelchair or bedside chair)?: A Little Help needed to walk in  hospital room?: A Little Help needed climbing 3-5 steps with a railing? : A Lot 6 Click Score: 18    End of Session Equipment Utilized During Treatment: Gait belt Activity Tolerance: Patient tolerated treatment well Patient left: in bed;with call bell/phone within reach;with bed alarm set Nurse Communication: Mobility status;Patient requests pain meds PT Visit Diagnosis: Unsteadiness on feet (R26.81);Difficulty in walking, not elsewhere classified (R26.2)    Time: 8462-8452 PT Time Calculation (min) (ACUTE ONLY): 10 min   Charges:   PT Evaluation $PT Eval Low Complexity: 1 Low   PT General Charges $$ ACUTE PT VISIT: 1 Visit         Aleck Daring, PT, DPT Acute Rehabilitation Services Office [phone]   [name] 09/28/2023, 4:31 PM

## 2023-09-28 NOTE — Evaluation (Signed)
 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 S/p TNK and embolectomy on 9/9. PMH includes GERD, bipolar disorder, HTN, HLD, hypothyroidism, obesity.     Clinical Impressions Pt ind at baseline with ADLs, uses cane for community mobility and occasionally RW in the home. Lives with spouse. Pt currently needs up to min A for ADLs, min A for bed mobility and min A for transfers with HHA, increased steadiness with RW. Pt able to ambulate to toilet and wash hands at sink, then reports feeling off and dizzy returned to chair, DBP elevated, RN notified. Pt presenting with impairments listed below, will follow acutely. Recommend HHOT at d/c.   BP supine 139/79 (94) BP seated EOB 141/81 (98) BP seated after walking to bathroom 145/121 (129)     If plan is discharge home, recommend the following:   A little help with walking and/or transfers;A little help with bathing/dressing/bathroom;Assistance with cooking/housework;Direct supervision/assist for medications management;Direct supervision/assist for financial management;Assist for transportation;Help with stairs or ramp for entrance;Supervision due to cognitive status     Functional Status Assessment   Patient has had a recent decline in their functional status and demonstrates the ability to make significant improvements in function in a reasonable and predictable amount of time.     Equipment Recommendations   Tub/shower seat     Recommendations for Other Services   PT consult     Precautions/Restrictions   Precautions Precautions: Fall Precaution/Restrictions  Comments: SBP <160 Restrictions Weight Bearing Restrictions Per Provider Order: No     Mobility Bed Mobility Overal bed mobility: Needs Assistance Bed Mobility: Supine to Sit     Supine to sit: Min assist          Transfers Overall transfer level: Needs assistance Equipment used: Rolling walker (2 wheels) Transfers: Sit to/from Stand Sit to Stand: Min assist                  Balance Overall balance assessment: Needs assistance Sitting-balance support: Feet supported Sitting balance-Leahy Scale: Good Sitting balance - Comments: reaches outside BOS without LOB     Standing balance-Leahy Scale: Poor Standing balance comment: reliant on external support, brief static standing with unilateral UE support                           ADL either performed or assessed with clinical judgement   ADL Overall ADL's : Needs assistance/impaired Eating/Feeding: Set up   Grooming: Wash/dry hands;Contact guard assist;Standing   Upper Body Bathing: Minimal assistance   Lower Body Bathing: Minimal assistance   Upper Body Dressing : Contact guard assist   Lower Body Dressing: Contact guard assist Lower Body Dressing Details (indicate cue type and reason): dons socks seated EOB Toilet Transfer: Contact guard assist;Ambulation;Rolling walker (2 wheels)   Toileting- Clothing Manipulation and Hygiene: Supervision/safety       Functional mobility during ADLs: Contact guard assist;Rolling walker (2 wheels)       Vision Baseline Vision/History: 1 Wears glasses Vision Assessment?: Yes Eye Alignment: Within Functional Limits Ocular Range of Motion: Within Functional Limits Alignment/Gaze Preference: Within Defined Limits  Tracking/Visual Pursuits: Decreased smoothness of horizontal tracking;Decreased smoothness of vertical tracking Additional Comments: overshooing/noted tracking L/R     Perception         Praxis         Pertinent Vitals/Pain Pain  Assessment Pain Assessment: Faces Pain Score: 2  Faces Pain Scale: Hurts a little bit Pain Location: headache Pain Descriptors / Indicators: Headache Pain Intervention(s): Monitored during session, Limited activity within patient's tolerance, Repositioned     Extremity/Trunk Assessment Upper Extremity Assessment Upper Extremity Assessment: Generalized weakness;Right hand dominant   Lower Extremity Assessment Lower Extremity Assessment: Defer to PT evaluation   Cervical / Trunk Assessment Cervical / Trunk Assessment: Normal   Communication Communication Communication: No apparent difficulties   Cognition Arousal: Alert Behavior During Therapy: Flat affect Cognition: No family/caregiver present to determine baseline             OT - Cognition Comments: A & O x4, follows commands with incr time, noted slow processing, not formally assessed                 Following commands: Impaired Following commands impaired: Follows one step commands with increased time     Cueing  General Comments   Cueing Techniques: Verbal cues  VSS on RA, see note for BP measures; R groin site CDI   Exercises     Shoulder Instructions      Home Living Family/patient expects to be discharged to:: Private residence Living Arrangements: Spouse/significant other Available Help at Discharge: Family;Available 24 hours/day Type of Home: House Home Access: Stairs to enter [employer] of Steps: 3 Entrance Stairs-Rails: Can reach both Home Layout: Two level;Able to live on main level with bedroom/bathroom     Bathroom Shower/Tub: Arts development officer Toilet: Handicapped height     Home Equipment: Agricultural consultant (2 wheels);Cane - single point;BSC/3in1          Prior Functioning/Environment Prior Level of Function : Needs assist;Driving             Mobility Comments: SPC for community mobility, RW at times in the home ADLs Comments: ind with ADL/IADL     OT Problem List: Decreased strength;Decreased range of motion;Decreased activity tolerance;Impaired vision/perception   OT Treatment/Interventions: Self-care/ADL training;Therapeutic exercise;Energy conservation;DME and/or AE instruction;Therapeutic activities;Patient/family education;Balance training;Cognitive remediation/compensation;Visual/perceptual remediation/compensation;Neuromuscular education      OT Goals(Current goals can be found in the care plan section)   Acute Rehab OT Goals Patient Stated Goal: did not state OT Goal Formulation: With patient Time For Goal Achievement: 10/12/23 Potential to Achieve Goals: Good   OT Frequency:  Min 2X/week    Co-evaluation              AM-PAC OT 6 Clicks Daily Activity     Outcome Measure Help from another person eating meals?: A Little Help from another person taking care of personal grooming?: A Little Help from another person toileting, which includes using toliet, bedpan, or urinal?: A Little Help from another person bathing (including washing, rinsing, drying)?: A Little Help from another person to put on and taking off regular upper body clothing?: A Little Help from another person to put on and taking off regular lower body clothing?: A Little 6 Click Score: 18   End of Session Equipment Utilized During Treatment: Rolling walker (2 wheels) Nurse Communication: Mobility status;Other (comment) (IV infusion complete, diastolic BP elevated)  Activity Tolerance: Patient tolerated treatment well Patient left: in chair;with call bell/phone within reach;with chair alarm set  OT Visit Diagnosis: Unsteadiness on feet (R26.81);Other abnormalities of gait and mobility (R26.89);Muscle weakness (generalized) (M62.81);History of falling (Z91.81);Other symptoms and signs involving cognitive function                Time: 1434-1500 OT Time Calculation (min): 26 min Charges:  OT General Charges $OT Visit: 1 Visit OT  Evaluation $OT Eval Moderate Complexity: 1 Mod OT Treatments $Self Care/Home Management : 8-22 mins  Danay Mckellar K, OTD, OTR/L [employer] [facility] Acute [facility] [phone]   Laneta POUR Koonce 09/28/2023, 3:24 PM

## 2023-09-28 NOTE — Progress Notes (Signed)
 Alert and feels well. States that she notices nothing different compared with before the stroke.  Femoral puncture site intact.    Alert and oriented with normal speech expression, fluency and comprehension. Visual fields are full to confrontation.   Face is symmetric. Strength in the arms and legs is symmetric with no drift. Normal grip strength. Sensation is normal and symmetric. No ataxia. No inattention.   Brain MRI and ECHO are pending.  A: Excellent neurological recovery P: Work up in progress to identify embolic source.  No carotid disease.

## 2023-09-28 NOTE — Plan of Care (Signed)
  Problem: Education: Goal: Knowledge of disease or condition will improve Outcome: Progressing   Problem: Ischemic Stroke/TIA Tissue Perfusion: Goal: Complications of ischemic stroke/TIA will be minimized Outcome: Progressing   Problem: Coping: Goal: Will verbalize positive feelings about self Outcome: Progressing Goal: Will identify appropriate support needs Outcome: Progressing   Problem: Health Behavior/Discharge Planning: Goal: Ability to manage health-related needs will improve Outcome: Progressing   Problem: Self-Care: Goal: Verbalization of feelings and concerns over difficulty with self-care will improve Outcome: Progressing Goal: Ability to communicate needs accurately will improve Outcome: Progressing   Problem: Nutrition: Goal: Risk of aspiration will decrease Outcome: Progressing Goal: Dietary intake will improve Outcome: Progressing   Problem: Education: Goal: Knowledge of General Education information will improve Description: Including pain rating scale, medication(s)/side effects and non-pharmacologic comfort measures Outcome: Progressing   Problem: Clinical Measurements: Goal: Ability to maintain clinical measurements within normal limits will improve Outcome: Progressing Goal: Respiratory complications will improve Outcome: Progressing Goal: Cardiovascular complication will be avoided Outcome: Progressing   Problem: Activity: Goal: Risk for activity intolerance will decrease Outcome: Progressing   Problem: Nutrition: Goal: Adequate nutrition will be maintained Outcome: Progressing   Problem: Elimination: Goal: Will not experience complications related to urinary retention Outcome: Progressing   Problem: Pain Managment: Goal: General experience of comfort will improve and/or be controlled Outcome: Progressing   Problem: Safety: Goal: Ability to remain free from injury will improve Outcome: Progressing   Problem: Skin Integrity: Goal:  Risk for impaired skin integrity will decrease Outcome: Progressing

## 2023-09-29 ENCOUNTER — Other Ambulatory Visit (HOSPITAL_COMMUNITY): Payer: Self-pay

## 2023-09-29 DIAGNOSIS — I6322 Cerebral infarction due to unspecified occlusion or stenosis of basilar arteries: Secondary | ICD-10-CM | POA: Diagnosis not present

## 2023-09-29 DIAGNOSIS — R29700 NIHSS score 0: Secondary | ICD-10-CM | POA: Diagnosis not present

## 2023-09-29 DIAGNOSIS — I160 Hypertensive urgency: Secondary | ICD-10-CM

## 2023-09-29 DIAGNOSIS — I10 Essential (primary) hypertension: Secondary | ICD-10-CM | POA: Diagnosis not present

## 2023-09-29 DIAGNOSIS — I2510 Atherosclerotic heart disease of native coronary artery without angina pectoris: Secondary | ICD-10-CM | POA: Diagnosis not present

## 2023-09-29 DIAGNOSIS — I4891 Unspecified atrial fibrillation: Secondary | ICD-10-CM | POA: Diagnosis not present

## 2023-09-29 LAB — BASIC METABOLIC PANEL WITH GFR
Anion gap: 10 (ref 5–15)
BUN: 7 mg/dL — ABNORMAL LOW (ref 8–23)
CO2: 22 mmol/L (ref 22–32)
Calcium: 9.3 mg/dL (ref 8.9–10.3)
Chloride: 105 mmol/L (ref 98–111)
Creatinine, Ser: 0.88 mg/dL (ref 0.44–1.00)
GFR, Estimated: >60 mL/min (ref >60)
Glucose, Bld: 105 mg/dL — ABNORMAL HIGH (ref 70–99)
Potassium: 3.9 mmol/L (ref 3.5–5.1)
Sodium: 137 mmol/L (ref 135–145)

## 2023-09-29 LAB — TSH: TSH: 8.772 u[IU]/mL — ABNORMAL HIGH (ref 0.350–4.500)

## 2023-09-29 LAB — GLUCOSE, CAPILLARY: Glucose-Capillary: 97 mg/dL (ref 70–99)

## 2023-09-29 MED ORDER — PRAVASTATIN SODIUM 20 MG PO TABS
10.0000 mg | ORAL_TABLET | Freq: Every day | ORAL | 0 refills | Status: AC
  Filled 2023-09-29: qty 15, 30d supply, fill #0

## 2023-09-29 MED ORDER — DABIGATRAN ETEXILATE MESYLATE 150 MG PO CAPS
150.0000 mg | ORAL_CAPSULE | Freq: Two times a day (BID) | ORAL | 11 refills | Status: AC
  Filled 2023-09-29: qty 60, 30d supply, fill #0

## 2023-09-29 MED ORDER — METOPROLOL TARTRATE 25 MG PO TABS
25.0000 mg | ORAL_TABLET | Freq: Three times a day (TID) | ORAL | 0 refills | Status: AC
  Filled 2023-09-29: qty 90, 30d supply, fill #0

## 2023-09-29 MED ORDER — DABIGATRAN ETEXILATE MESYLATE 150 MG PO CAPS
150.0000 mg | ORAL_CAPSULE | Freq: Two times a day (BID) | ORAL | 0 refills | Status: DC
  Filled 2023-09-29: qty 60, 30d supply, fill #0

## 2023-09-29 NOTE — Discharge Instructions (Signed)
Information on my medicine - Pradaxa (dabigatran)  Why was Pradaxa prescribed for you? Pradaxa was prescribed for you to reduce the risk of forming blood clots that cause a stroke if you have a medical condition called atrial fibrillation (a type of irregular heartbeat).    What do you Need to know about PradAXa? Take your Pradaxa TWICE DAILY - one capsule in the morning and one tablet in the evening with or without food.  It would be best to take the doses about the same time each day.  The capsules should not be broken, chewed or opened - they must be swallowed whole.  Do not store Pradaxa in other medication containers - once the bottle is opened the Pradaxa should be used within FOUR months; throw away any capsules that haven't been by that time.  Take Pradaxa exactly as prescribed by your doctor.  DO NOT stop taking Pradaxa without talking to the doctor who prescribed the medication.  Stopping without other stroke prevention medication to take the place of Pradaxa may increase your risk of developing a clot that causes a stroke.  Refill your prescription before you run out.  After discharge, you should have regular check-up appointments with your healthcare provider that is prescribing your Pradaxa.  In the future your dose may need to be changed if your kidney function or weight changes by a significant amount.  What do you do if you miss a dose? If you miss a dose, take it as soon as you remember on the same day.  If your next dose is less than 6 hours away, skip the missed dose.  Do not take two doses of PRADAXA at the same time.  Important Safety Information A possible side effect of Pradaxa is bleeding. You should call your healthcare provider right away if you experience any of the following: ? Bleeding from an injury or your nose that does not stop. ? Unusual colored urine (red or dark brown) or unusual colored stools (red or black). ? Unusual bruising for unknown  reasons. ? A serious fall or if you hit your head (even if there is no bleeding).  Some medicines may interact with Pradaxa and might increase your risk of bleeding or clotting while on Pradaxa. To help avoid this, consult your healthcare provider or pharmacist prior to using any new prescription or non-prescription medications, including herbals, vitamins, non-steroidal anti-inflammatory drugs (NSAIDs) and supplements.  This website has more information on Pradaxa (dabigatran): [email or web address].com     

## 2023-09-29 NOTE — Progress Notes (Addendum)
 Progress Note  Patient Name: Jessica Norman Date of Encounter: 09/29/2023  [facility] Cardiologist: None   Patient Profile     Subjective   Denies any CP or SOB this am. HR well controlled on BB  Inpatient Medications    Scheduled Meds:  amLODipine   10 mg Oral Daily   Chlorhexidine  Gluconate Cloth  6 each Topical Q0600   dabigatran   150 mg Oral Q12H   docusate  100 mg Oral BID   hydroxychloroquine   200 mg Oral BID   levothyroxine   150 mcg Oral QAC breakfast   lisinopril   20 mg Oral Daily   metoprolol  tartrate  25 mg Oral Q8H   polyethylene glycol  17 g Oral Daily   pravastatin   10 mg Oral q1800   Continuous Infusions:  clevidipine  Stopped (09/28/23 1511)   PRN Meds: acetaminophen  **OR** acetaminophen  (TYLENOL ) oral liquid 160 mg/5 mL **OR** acetaminophen , albuterol , hydrALAZINE , labetalol , ondansetron  (ZOFRAN ) IV, mouth rinse, senna-docusate   Vital Signs    Vitals:   09/28/23 2354 09/29/23 0028 09/29/23 0430 09/29/23 0528  BP: (!) 148/95 (!) 171/76 (!) 171/76 (!) 159/93  Pulse: 94  95 81  Resp: 19  19   Temp: 98.9 F (37.2 C)  98.7 F (37.1 C)   TempSrc: Oral  Oral   SpO2: 99%  100%   Weight:      Height:        Intake/Output Summary (Last 24 hours) at 09/29/2023 0836 Last data filed at 09/28/2023 1800 Gross per 24 hour  Intake 762.69 ml  Output 1400 ml  Net -637.31 ml      09/27/2023   12:00 PM 09/15/2023    9:05 AM 04/26/2023   10:15 AM  Last 3 Weights  Weight (lbs) 218 lb 4.1 oz 212 lb 212 lb 4.9 oz  Weight (kg) 99 kg 96.163 kg 96.3 kg      Telemetry    Atrial fibrillation - Personally Reviewed  ECG    No new EKG to review - Personally Reviewed  Physical Exam   GEN: No acute distress.   Neck: No JVD Cardiac: irregularly irregular, no murmurs, rubs, or gallops.  Respiratory: Clear to auscultation bilaterally. GI: Soft, nontender, non-distended  MS: No edema; No deformity. Neuro:  Nonfocal  Psych: Normal affect   Labs     High Sensitivity Troponin:  No results for input(s): TROPONINIHS in the last 720 hours.    Chemistry Recent Labs  Lab 09/27/23 1435 09/27/23 1523 09/28/23 1331 09/29/23 0152  NA 141 143 139 137  K 4.0 3.6 3.6 3.9  CL 109  --  108 105  CO2 23  --  23 22  GLUCOSE 93  --  101* 105*  BUN 10  --  7* 7*  CREATININE 1.01*  --  1.06* 0.88  CALCIUM  8.7*  --  9.2 9.3  PROT  --   --  7.0  --   ALBUMIN  --   --  3.3*  --   AST  --   --  25  --   ALT  --   --  18  --   ALKPHOS  --   --  59  --   BILITOT  --   --  0.9  --   GFRNONAA 59*  --  55* >60  ANIONGAP 9  --  8 10     Hematology Recent Labs  Lab 09/27/23 1210 09/27/23 1435 09/27/23 1523  WBC  --  10.7*  --   RBC  --  4.40  --   HGB 12.2 11.8* 12.2  HCT 36.0 38.0 36.0  MCV  --  86.4  --   MCH  --  26.8  --   MCHC  --  31.1  --   RDW  --  14.7  --   PLT  --  173  --     BNPNo results for input(s): BNP, PROBNP in the last 168 hours.   DDimer No results for input(s): DDIMER in the last 168 hours.   CHA2DS2-VASc Score = 6   This indicates a 9.7% annual risk of stroke. The patient's score is based upon: CHF History: 0 HTN History: 1 Diabetes History: 0 Stroke History: 2 Vascular Disease History: 1 Age Score: 1 Gender Score: 1      Radiology    ECHOCARDIOGRAM COMPLETE Result Date: 09/28/2023    ECHOCARDIOGRAM REPORT   Patient Name:   Jessica Norman Date of Exam: 09/28/2023 Medical Rec #:  [id number]        Height:       69.0 in Accession #:    [id number]       Weight:       218.3 lb Date of Birth:  1950/03/03        BSA:          2.144 m Patient Age:    73 years         BP:           144/124 mmHg Patient Gender: F                HR:           90 bpm. Exam Location:  Inpatient Procedure: 2D Echo, Intracardiac Opacification Agent, Cardiac Doppler and Color            Doppler (Both Spectral and Color Flow Doppler were utilized during            procedure). Indications:    Stroke  History:        Patient has  no prior history of Echocardiogram examinations.                 Risk Factors:Hypertension and Former Smoker.  Sonographer:    Juliene Rucks Referring Phys: [id number] [facility] KHALIQDINA IMPRESSIONS  1. Left ventricular ejection fraction, by estimation, is 65 to 70%. The left ventricle has normal function. The left ventricle has no regional wall motion abnormalities. There is mild left ventricular hypertrophy. Left ventricular diastolic function could not be evaluated.  2. Right ventricular systolic function is normal. The right ventricular size is normal. There is normal pulmonary artery systolic pressure.  3. The mitral valve is grossly normal. Trivial mitral valve regurgitation.  4. The aortic valve is tricuspid. Aortic valve regurgitation is not visualized.  5. The inferior vena cava is normal in size with greater than 50% respiratory variability, suggesting right atrial pressure of 3 mmHg. Comparison(s): No prior Echocardiogram. FINDINGS  Left Ventricle: Left ventricular ejection fraction, by estimation, is 65 to 70%. The left ventricle has normal function. The left ventricle has no regional wall motion abnormalities. Definity  contrast agent was given IV to delineate the left ventricular  endocardial borders. The left ventricular internal cavity size was normal in size. There is mild left ventricular hypertrophy. Left ventricular diastolic function could not be evaluated due to atrial fibrillation. Left ventricular diastolic function could not be evaluated. Right Ventricle: The right ventricular size is  normal. No increase in right ventricular wall thickness. Right ventricular systolic function is normal. There is normal pulmonary artery systolic pressure. The tricuspid regurgitant velocity is 2.78 m/s, and  with an assumed right atrial pressure of 3 mmHg, the estimated right ventricular systolic pressure is 33.9 mmHg. Left Atrium: Left atrial size was normal in size. Right Atrium: Right atrial size was normal in  size. Pericardium: There is no evidence of pericardial effusion. Mitral Valve: The mitral valve is grossly normal. Trivial mitral valve regurgitation. Tricuspid Valve: The tricuspid valve is grossly normal. Tricuspid valve regurgitation is mild. Aortic Valve: The aortic valve is tricuspid. Aortic valve regurgitation is not visualized. Pulmonic Valve: The pulmonic valve was normal in structure. Pulmonic valve regurgitation is not visualized. Aorta: The aortic root and ascending aorta are structurally normal, with no evidence of dilitation. Venous: The inferior vena cava is normal in size with greater than 50% respiratory variability, suggesting right atrial pressure of 3 mmHg. IAS/Shunts: The interatrial septum was not well visualized.  LEFT VENTRICLE PLAX 2D LVIDd:         4.50 cm     Diastology LVIDs:         3.10 cm     LV e' medial:    9.25 cm/s LV PW:         1.10 cm     LV E/e' medial:  11.8 LV IVS:        1.20 cm     LV e' lateral:   9.68 cm/s LVOT diam:     2.00 cm     LV E/e' lateral: 11.3 LV SV:         57 LV SV Index:   27 LVOT Area:     3.14 cm  LV Volumes (MOD) LV vol d, MOD A4C: 61.9 ml LV vol s, MOD A4C: 28.8 ml LV SV MOD A4C:     61.9 ml RIGHT VENTRICLE             IVC RV S prime:     13.90 cm/s  IVC diam: 1.40 cm LEFT ATRIUM           Index LA diam:      4.10 cm 1.91 cm/m LA Vol (A4C): 48.1 ml 22.43 ml/m  AORTIC VALVE LVOT Vmax:   119.00 cm/s LVOT Vmean:  78.100 cm/s LVOT VTI:    0.183 m  AORTA Ao Root diam: 3.00 cm Ao Asc diam:  3.20 cm MITRAL VALVE                TRICUSPID VALVE MV Area (PHT): 3.15 cm     TR Peak grad:   30.9 mmHg MV Decel Time: 241 msec     TR Vmax:        278.00 cm/s MV E velocity: 109.00 cm/s                             SHUNTS                             Systemic VTI:  0.18 m                             Systemic Diam: 2.00 cm Vinie Maxcy MD Electronically signed by Vinie Maxcy MD Signature Date/Time: 09/28/2023/3:22:19 PM    Final    MR BRAIN WO  CONTRAST Result Date:  09/28/2023 CLINICAL DATA:  73 year old female status post code stroke presentation yesterday. 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 TNK given, status post endovascular Basilar artery revascularization. EXAM: MRI HEAD WITHOUT CONTRAST TECHNIQUE: Multiplanar, multiecho pulse sequences of the brain and surrounding structures were obtained without intravenous contrast. COMPARISON:  Head CT, CTA head and neck yesterday. Brain MRI 06/30/2022. FINDINGS: Brain: Patchy, confluent left PCA territory restricted diffusion, limited to the occipital lobe. Similar Patchy and confluent left SCA or AICA cerebellar restricted diffusion (series 14, image 47). 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 Confluent T2 and FLAIR hyperintensity in those areas. Punctate petechial hemorrhage possible in the medial left occipital lobe (series 12, image 26) but elsewhere on SWI numerous posterior circulation chronic microhemorrhages are new from the MRI last year. Heidelberg classification 1a: HI1, scattered small petechiae, no mass effect. Punctate area of diffusion restriction at the tail of the right hippocampus (series 5, image 69). No convincing brainstem restricted diffusion. No other restricted diffusion identified. Cerebral volume not significantly changed from last year. No midline shift, mass effect, evidence of mass lesion, ventriculomegaly, extra-axial collection. Cervicomedullary junction and pituitary are within normal limits. Widely scattered cerebral white matter T2 and FLAIR hyperintensity in the supratentorial brain not significantly changed. Pontine mild to moderate T2 and FLAIR heterogeneity appears stable to minimally increased. Vascular: Major intracranial vascular flow voids appear stable from the 2024 MRI. Skull and upper cervical spine: Sequelae of cervical spine ACDF. Visualized bone marrow signal is within normal limits. Sinuses/Orbits: Stable, negative. Other: Mastoids well  aerated.  Negative visible scalp and face. IMPRESSION: 1. Patchy, relatively small infarcts in the Left cerebellum and Left occipital lobe. Punctate infarct at the tail of the right hippocampus. 2. Although difficult to exclude Heidelberg classification 1a: HI1 petechial blood in the affected Left PCA territory, could instead be chronic as numerous other chronic microhemorrhages in the posterior circulation also appear new from SWI last year. 3. No intracranial mass effect. And otherwise stable from last year chronic signal changes in the bilateral cerebral white matter, pons, left cerebellum. Electronically Signed   By: VEAR Hurst M.D.   On: 09/28/2023 13:12   IR PERCUTANEOUS ART THROMBECTOMY/INFUSION INTRACRANIAL INC DIAG ANGIO Result Date: 09/28/2023 INDICATION: Acute Large Vessel occlusion, EXAM: Mechanical thrombectomy, aspiration thrombectomy CONSENT: Consent was obtained by telephone from the patient's daughter MEDICATIONS: No additional ANESTHESIA/SEDATION: General CONTRAST:  Thirty ml Omnipaque  300 FLUOROSCOPY: Radiation Exposure Index (as provided by the fluoroscopic device): 325 MGy reference air Kerma COMPLICATIONS: None immediate. TECHNIQUE: Maximal Sterile Barrier Technique was utilized including caps, mask, sterile gowns, sterile gloves, sterile drape, hand hygiene and skin antiseptic. A timeout was performed prior to the initiation of the procedure. FINDINGS: Left subclavian: The left vertebral artery origin is normal. There is sluggish flow in the left vertebral artery. 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 Post thrombectomy left vertebral: There is normal flow in the left vertebral artery and basilar artery with no residual basilar thrombus. There is a normal cerebellar blush. The left posterior cerebral artery fills from the vertebral injection. There is flash flow in the right posterior cerebral artery. Right carotid post thrombectomy: The carotid artery, anterior,  middle and posterior cerebral arteries are normal. There is a large posterior communicating artery supplying the right posterior cerebral artery. PROCEDURE: Femoral access was obtained with ultrasound using direct visualization of the needle puncture into the artery. A 80 cm neuron max sheath was placed.  The neuron max was advanced over a Berenstein catheter into the left vertebral artery. The Red 72 Sendit catheter was advanced over a synchro support wire to the level of the occlusion and connected to suction. The catheter was then withdrawn under aspiration. This resulted in aspiration of large amount of thrombus. Follow-up arteriogram showed an embolus occluding the distal basilar artery. The Red 72 Sendit catheter was readvanced over a synchro support wire to the level of the occlusion and connected to suction. The catheter was then withdrawn under aspiration. This resulted in aspiration of the embolus. The follow-up arteriogram now demonstrated normal flow in the left vertebral artery, basilar artery, cerebellar arteries and left posterior cerebral artery. There was flash filling of the right posterior cerebral artery. The catheters were removed from the left vertebral artery and the Berenstein catheter was placed into the right common carotid artery for study of the cerebral circulation to identify the right posterior cerebral artery. This was found to be coming from the right carotid artery and was normal. Hemostasis was obtained with Angio-Seal. IMPRESSION: Large basilar embolus removed with embolectomy PLAN: Patient left intubated and admitted to ICU for observation Electronically Signed   By: Nancyann Burns M.D.   On: 09/28/2023 10:30   IR US  Guide Vasc Access Right Result Date: 09/28/2023 INDICATION: Acute Large Vessel occlusion, EXAM: Mechanical thrombectomy, aspiration thrombectomy CONSENT: Consent was obtained by telephone from the patient's daughter MEDICATIONS: No additional ANESTHESIA/SEDATION:  General CONTRAST:  Thirty ml Omnipaque  300 FLUOROSCOPY: Radiation Exposure Index (as provided by the fluoroscopic device): 325 MGy reference air Kerma COMPLICATIONS: None immediate. TECHNIQUE: Maximal Sterile Barrier Technique was utilized including caps, mask, sterile gowns, sterile gloves, sterile drape, hand hygiene and skin antiseptic. A timeout was performed prior to the initiation of the procedure. FINDINGS: Left subclavian: The left vertebral artery origin is normal. There is sluggish flow in the left vertebral artery. 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 Post thrombectomy left vertebral: There is normal flow in the left vertebral artery and basilar artery with no residual basilar thrombus. There is a normal cerebellar blush. The left posterior cerebral artery fills from the vertebral injection. There is flash flow in the right posterior cerebral artery. Right carotid post thrombectomy: The carotid artery, anterior, middle and posterior cerebral arteries are normal. There is a large posterior communicating artery supplying the right posterior cerebral artery. PROCEDURE: Femoral access was obtained with ultrasound using direct visualization of the needle puncture into the artery. A 80 cm neuron max sheath was placed. The neuron max was advanced over a Berenstein catheter into the left vertebral artery. The Red 72 Sendit catheter was advanced over a synchro support wire to the level of the occlusion and connected to suction. The catheter was then withdrawn under aspiration. This resulted in aspiration of large amount of thrombus. Follow-up arteriogram showed an embolus occluding the distal basilar artery. The Red 72 Sendit catheter was readvanced over a synchro support wire to the level of the occlusion and connected to suction. The catheter was then withdrawn under aspiration. This resulted in aspiration of the embolus. The follow-up arteriogram now demonstrated normal flow in  the left vertebral artery, basilar artery, cerebellar arteries and left posterior cerebral artery. There was flash filling of the right posterior cerebral artery. The catheters were removed from the left vertebral artery and the Berenstein catheter was placed into the right common carotid artery for study of the cerebral circulation to identify the right posterior cerebral artery.  This was found to be coming from the right carotid artery and was normal. Hemostasis was obtained with Angio-Seal. IMPRESSION: Large basilar embolus removed with embolectomy PLAN: Patient left intubated and admitted to ICU for observation Electronically Signed   By: Nancyann Burns M.D.   On: 09/28/2023 10:30   IR ANGIO EXTRACRAN SEL COM CAROTID INNOMINATE UNI R MOD SED Result Date: 09/28/2023 INDICATION: Acute Large Vessel occlusion, EXAM: Mechanical thrombectomy, aspiration thrombectomy CONSENT: Consent was obtained by telephone from the patient's daughter MEDICATIONS: No additional ANESTHESIA/SEDATION: General CONTRAST:  Thirty ml Omnipaque  300 FLUOROSCOPY: Radiation Exposure Index (as provided by the fluoroscopic device): 325 MGy reference air Kerma COMPLICATIONS: None immediate. TECHNIQUE: Maximal Sterile Barrier Technique was utilized including caps, mask, sterile gowns, sterile gloves, sterile drape, hand hygiene and skin antiseptic. A timeout was performed prior to the initiation of the procedure. FINDINGS: Left subclavian: The left vertebral artery origin is normal. There is sluggish flow in the left vertebral artery. 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 Post thrombectomy left vertebral: There is normal flow in the left vertebral artery and basilar artery with no residual basilar thrombus. There is a normal cerebellar blush. The left posterior cerebral artery fills from the vertebral injection. There is flash flow in the right posterior cerebral artery. Right carotid post thrombectomy: The carotid  artery, anterior, middle and posterior cerebral arteries are normal. There is a large posterior communicating artery supplying the right posterior cerebral artery. PROCEDURE: Femoral access was obtained with ultrasound using direct visualization of the needle puncture into the artery. A 80 cm neuron max sheath was placed. The neuron max was advanced over a Berenstein catheter into the left vertebral artery. The Red 72 Sendit catheter was advanced over a synchro support wire to the level of the occlusion and connected to suction. The catheter was then withdrawn under aspiration. This resulted in aspiration of large amount of thrombus. Follow-up arteriogram showed an embolus occluding the distal basilar artery. The Red 72 Sendit catheter was readvanced over a synchro support wire to the level of the occlusion and connected to suction. The catheter was then withdrawn under aspiration. This resulted in aspiration of the embolus. The follow-up arteriogram now demonstrated normal flow in the left vertebral artery, basilar artery, cerebellar arteries and left posterior cerebral artery. There was flash filling of the right posterior cerebral artery. The catheters were removed from the left vertebral artery and the Berenstein catheter was placed into the right common carotid artery for study of the cerebral circulation to identify the right posterior cerebral artery. This was found to be coming from the right carotid artery and was normal. Hemostasis was obtained with Angio-Seal. IMPRESSION: Large basilar embolus removed with embolectomy PLAN: Patient left intubated and admitted to ICU for observation Electronically Signed   By: Nancyann Burns M.D.   On: 09/28/2023 10:30   DG Abd Portable 1V Result Date: 09/27/2023 CLINICAL DATA:  Orogastric tube placement. EXAM: PORTABLE ABDOMEN - 1 VIEW COMPARISON:  Abdominopelvic CT 12/27/2018. FINDINGS: 1424 hours. Tip of the enteric tube overlies the L2 vertebral body, likely within the  distal stomach. The visualized bowel gas pattern is nonobstructive. Mildly prominent colonic stool. There is contrast material within the renal collecting systems bilaterally attributed to previous CTA. Mild degenerative changes in the spine without acute osseous abnormality. IMPRESSION: Enteric tube tip overlies the distal stomach. Electronically Signed   By: Elsie Perone M.D.   On: 09/27/2023 15:08   Portable Chest x-ray Result Date:  09/27/2023 CLINICAL DATA:  [id number] Endotracheal tube present [id number] EXAM: PORTABLE CHEST 1 VIEW COMPARISON:  Radiographs 07/13/2017 and 04/26/2023. Cardiac CT 11/25/2022. FINDINGS: Tip of the endotracheal tube is at the carina and should be withdrawn approximately 5 cm. An enteric tube projects below the diaphragm, tip not visualized on this examination. There are low lung volumes with mild resulting atelectasis at both lung bases. No confluent airspace disease, pneumothorax or significant pleural effusion identified. The heart size and mediastinal contours are stable with aortic atherosclerosis. Patient is status post lower cervical fusion. No acute osseous findings are evident. IMPRESSION: 1. Tip of the endotracheal tube is at the carina and should be withdrawn approximately 5 cm. 2. Low lung volumes with mild bibasilar atelectasis. 3. These results will be called to the ordering clinician or representative by the Radiologist Assistant, and communication documented in the PACS or [employer]. Electronically Signed   By: Elsie Perone M.D.   On: 09/27/2023 15:06   CT ANGIO HEAD NECK W WO CM (CODE STROKE) Result Date: 09/27/2023 EXAM: CTA Head and Neck with Intravenous Contrast. CLINICAL HISTORY: Neuro deficit, acute, stroke suspected. Left sided gaze. AMS TECHNIQUE: Axial CTA images of the head and neck performed with intravenous contrast. MIP reconstructed images were created and reviewed. Axial computed tomography images of the head/brain performed without  intravenous contrast. Note: Per PQRS, the description of internal carotid artery percent stenosis, including 0 percent or normal exam, is based on [place] American Symptomatic Carotid Endarterectomy Trial (NASCET) criteria. Dose reduction technique was used including one or more of the following: automated exposure control, adjustment of mA and kV according to patient size, and/or iterative reconstruction. CONTRAST: Without and with; 75mL iohexol  (OMNIPAQUE ) 350 MG/ML injection COMPARISON: CT head same day FINDINGS: CTA NECK: COMMON CAROTID ARTERIES: Mild-to-moderate atherosclerosis of the visualized aortic arch involving the origin of the left subclavian artery without significant stenosis. INTERNAL CAROTID ARTERIES: Mild calcified atherosclerosis at the right carotid bifurcation without hemodynamically significant stenosis. Minimal focal atherosclerosis at the left carotid bifurcation without hemodynamically significant stenosis. VERTEBRAL ARTERIES: The left vertebral artery is dominant. Atherosclerosis at the vertebral artery origins. There is mild stenosis at the right vertebral artery origin. There is mild tortuosity of the left V1 segment. The vertebral arteries are patent from the origins to the proximal V4 segments. There is diminished contrast within the vertebral arteries within the V3 and proximal V4 segments. There is occlusion of the V4 segment of the right vertebral artery likely distal to the origin of the right PICA. There is tapering and significantly diminished contrast within the distal left V4 segment. CTA HEAD: The intracranial internal carotid arteries are patent bilaterally. Atherosclerosis of the bilateral carotid siphons with mild stenosis of the bilateral cavernous segments. ANTERIOR CEREBRAL ARTERIES: The anterior cerebral arteries are patent bilaterally. MIDDLE CEREBRAL ARTERIES: The middle cerebral arteries are patent bilaterally. POSTERIOR CEREBRAL ARTERIES: The right posterior cerebral  artery is supplied via the right posterior communicating artery and appears patent although somewhat diminished in caliber. There is reconstitution of the P1 segment of the left PCA. There is severe stenosis of the P2P segment of the left PCA. There are patent parieto-occipital branches of the left PCA. There is likely occlusion of the calcarine artery on the left. BASILAR ARTERY: Occlusion of the proximal basilar artery. OTHER: There is faint contrast within the proximal aspect of the superior cerebellar arteries. The left superior cerebellar artery is occluded proximally. The right superior cerebellar artery appears patent but diminished in caliber. There  is diminished contrast within the posterior inferior cerebellar arteries bilaterally. The left PICA origin is possibly extradural. SOFT TISSUES: No acute finding. No masses or lymphadenopathy. BONES: Degenerative changes in the visualized spine. Anterior cervical fusion hardware from C3 to C5. IMPRESSION: 1. 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 2. 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 3. 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 The right superior cerebellar artery appears patent but significantly diminished in caliber. 4. 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 Tapering and significantly diminished contrast within the distal left v4 segment. 5. Mild-to-moderate atherosclerosis of the visualized aortic arch involving the origin of the left subclavian artery. 6. Findings discussed with Dr. Vanessa at 12:20PM on 09/27/23. Electronically signed by: Donnice Mania MD 09/27/2023 12:51 PM EDT RP Workstation: [id number]   CT HEAD CODE STROKE WO CONTRAST Result Date: 09/27/2023 EXAM: CT HEAD WITHOUT CONTRAST 09/27/2023 12:12:00 PM TECHNIQUE: CT of the head was performed without the administration of  intravenous contrast. Automated exposure control, iterative reconstruction, and/or weight based adjustment of the mA/kV was utilized to reduce the radiation dose to as low as reasonably achievable. COMPARISON: CT head 48 25 CLINICAL HISTORY: Neuro deficit, acute, stroke suspected. Aphasia. Left sided gaze. FINDINGS: BRAIN AND VENTRICLES: Nonspecific hypoattenuation in the periventricular and subcortical white matter, most likely representing chronic small vessel disease. Similar volume loss similar prominence of the ventricles. ORBITS: Bilateral lens replacement. SINUSES: No acute abnormality. SOFT TISSUES AND SKULL: No acute soft tissue abnormality. No skull fracture. VASCULATURE: Atherosclerosis involving the carotid siphons. [place] stroke program early CT (ASPECT) score: Ganglionic (caudate, IC, lentiform nucleus, insula, M1-M3): 7. Supraganglionic (M4-M6): 3. Total: 10. IMPRESSION: 1. No acute intracranial abnormality. 2. Findings messaged to Dr. Vanessa at 12:19PM on 09/27/23. Electronically signed by: Donnice Mania MD 09/27/2023 12:20 PM EDT RP Workstation: [id number]    Patient Profile     73 y.o. female  with a hx of mild-moderate CAD, asthma, bipolar disorder, HTN, HLD, depression, anemia, Sjogren's, arthritis, prior hypothyroidism, memory loss, obesity, neuropathy who is being seen 09/28/2023 for the evaluation of atrial fibrillation at the request of Dr. Rosemarie.   Assessment & Plan    New onset atrial fibrillation  - No history of A-fib in the past - Presented with acute CVA with MRI showing multiple small infarcts in the occipital (start and left cerebellar lobes) - A-fib likely the etiology of acute CVA - no real history of palpitations but occasionally would have a awareness that she would need to cough - 2D echo with normal LV function and normal LA size - Has had problems with hypertension in the past and was been on carvedilol  25 mg twice daily PTA - Given 10 mg IV labetalol  for  elevated heart rate in ER - Will not restart PTA carvedilol  and instead add Lopressor  25 mg every 8 hours for better heart rate control - Heart rate much improved today - Transition Lopressor  25 mg every 8 hours to Lopressor  50 mg twice daily>> can consolidate to Toprol  XL 100 mg daily at discharge -Continue Pradaxa  150 mg daily (this was chosen due to cost issues) - Will plan on rate control for now and then cardioversion in the future after she has been anticoagulated for at least 3 to 4 weeks uninterrupted - TSH 8.77>> defer to TRH/stroke team   Nonobstructive  CAD Chronic Chest Pain HLD -cath 01/07/2023 showing 25% ostial and distal left main, 50% ostial left circumflex and 25% proximal LAD with medical management recommended. -has chronic CP that initially prompted cath>>Cp has continued but is atypical and very brief and fleeting with no associated sx>>she says it has actually improved since her cath -she is not a candidate for repeat cath due to acute CVA as well as no signficant change in severity, duration or quality of CP since before cath that showed no obstructive disease -2D echo this admit with normal LVF -I do not think her chest pain is cardiac in origin>> continue medical management of CAD -no ASA due to need for DOAC and concern for hemorrhagic conversion given recent TNK -Lipids 09/28/23: LDL 56, HDL 43, Tag 55 -Continue pravastatin  10 mg daily   HTN -this has been difficult to treat outpatient and has been following Dr. Florencio -BP this morning 159/93 to 171/76 mmHg -Continue amlodipine  10 mg daily, lisinopril  20 mg daily -Increasing Lopressor  to 50 mg twice daily and can consolidate to Toprol  XL 100 mg daily at discharge -PTA Hydralazine , hydrochlorothiazide  and Carvedilol  not ordered -Further BP management per stroke team   [facility] will sign off.   The patient is ready for discharge today from a cardiac standpoint. Medication Recommendations:  Toprol -XL 100 mg daily, pravastatin  10 mg daily, amlodipine  10 mg daily, Pradaxa  150 mg twice daily.  Further blood pressure recommendations per stroke team Other recommendations (labs, testing, etc): None Follow up as an outpatient: A-fib clinic in 1 week  For questions or updates, please contact [facility] Please consult [email or web address] for contact info under        Signed, Wilbert Bihari, MD  09/29/2023, 8:36 AM

## 2023-09-29 NOTE — TOC Transition Note (Signed)
 Transition of Care [facility]) - Discharge Note   Patient Details  Name: Jessica Norman MRN: [id number] Date of Birth: 17-Jul-1950  [facility]) CM/SW Contact:  Roxie KANDICE Stain, RN Phone Number: 09/29/2023, 11:26 AM   Clinical Narrative:    Patient stable for discharge.  This RNCM offered choice for Home Health, KANDEE ESCALANTE states she has no preference, RNCM made referral to [facility] with Hedda, He is able to take referral.  Patient's family will transport home. Address, Phone number and PCP verified.    Final next level of care: Home w Home Health Services Barriers to Discharge: Barriers Resolved   Patient Goals and CMS Choice Patient states their goals for this hospitalization and ongoing recovery are:: return home          Discharge Placement                 home      Discharge Plan and Services Additional resources added to the After Visit Summary for                                       Social Drivers of Health (SDOH) Interventions SDOH Screenings   Food Insecurity: No Food Insecurity (01/06/2023)   Received from [facility]  Housing: Low Risk  (04/26/2023)   Received from [facility]  Transportation Needs: No Transportation Needs (01/06/2023)   Received from [facility]  Utilities: Not At Risk (01/06/2023)   Received from [facility]  Financial Resource Strain: Low Risk  (01/06/2023)   Received from [facility]  Tobacco Use: Medium Risk (09/15/2023)     Readmission Risk Interventions    09/29/2023   11:26 AM  Readmission Risk Prevention Plan  Post Dischage Appt Complete  Medication Screening Complete  Transportation Screening Complete

## 2023-09-29 NOTE — Discharge Summary (Addendum)
 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 2/2 newly diagnosed paroxysmal atrial fibrillation treated with successful mechanical thrombectomy with excellent revascularization and clinical recovery  Patient Active Problem List   Diagnosis Date Noted   Hypertensive urgency 09/29/2023   New onset atrial fibrillation (HCC) 09/28/2023   Coronary artery disease due to lipid rich plaque 09/28/2023   Chest pain of uncertain etiology 09/28/2023   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   Status post total knee replacement using cement, right 07/01/2020   Lumbar stenosis 02/04/2020   Status post total knee replacement using cement, left 07/26/2017   Gait disturbance 05/25/2016   Abnormal brain MRI 05/25/2016   Memory loss 08/28/2015   Incontinence 05/06/2015   Numbness 11/25/2014   Insomnia 08/01/2014   Abdominal pain, left upper quadrant 07/16/2014   Abnormal finding on ultrasound 07/16/2014   Fatty liver disease, nonalcoholic 07/16/2014   Small intestinal bacterial overgrowth 07/16/2014   Asthma without status asthmaticus 06/20/2014   HLD (hyperlipidemia) 06/20/2014   Dermatitis, eczematoid 06/20/2014   Acid reflux 06/20/2014   Cephalalgia 06/20/2014   BP (high blood pressure) 06/20/2014   Adiposity 06/20/2014   Arthritis, degenerative 06/20/2014   Allergic rhinitis, seasonal 06/20/2014   Proximal limb weakness 06/20/2014   Proximal leg weakness 06/20/2014   Disturbed cognition 06/20/2014   Carpal tunnel syndrome 06/20/2014   Tinnitus 06/20/2014   Allergies as of  09/29/2023       Reactions   Pantoprazole  Anaphylaxis   Codeine Hives   Penicillins Hives   Sulfa Antibiotics Hives        Medication List     STOP taking these medications    aspirin  EC 81 MG tablet   carvedilol  25 MG tablet Commonly known as: COREG    gabapentin 300 MG capsule Commonly known as: NEURONTIN   hydrALAZINE  25 MG tablet Commonly known as: APRESOLINE    hydrochlorothiazide  25 MG tablet Commonly known as: HYDRODIURIL    lovastatin 10 MG tablet Commonly known as: MEVACOR   pilocarpine 5 MG tablet Commonly known as: SALAGEN   rosuvastatin  40 MG tablet Commonly known as: CRESTOR    VESIcare 5 MG tablet Generic drug: solifenacin       TAKE these medications    albuterol  108 (90 Base) MCG/ACT inhaler Commonly known as: VENTOLIN  HFA Inhale 1-2 puffs into the lungs every 6 (six) hours as needed for shortness of breath or wheezing.   amLODipine  10 MG tablet Commonly known as: NORVASC  Take 10 mg by mouth daily.   cycloSPORINE  0.05 % ophthalmic emulsion Commonly known as: RESTASIS  Place 1 drop into both eyes 2 (two) times daily.   dabigatran  150 MG Caps capsule Commonly known as: PRADAXA  Take 1 capsule (150 mg total) by mouth every 12 (twelve) hours.   divalproex  500 MG 24 hr tablet Commonly known as: DEPAKOTE  ER Take 1,000 mg by mouth at bedtime.   esomeprazole 40 MG capsule Commonly known as: NEXIUM Take 40 mg by mouth in the morning.   fexofenadine 180 MG tablet Commonly known as: ALLEGRA Take 180 mg by mouth daily.   furosemide 20 MG tablet Commonly  known as: LASIX Take 20 mg by mouth daily as needed for edema.   hydroxychloroquine  200 MG tablet Commonly known as: PLAQUENIL  Take 200 mg by mouth 2 (two) times daily.   isosorbide mononitrate 30 MG 24 hr tablet Commonly known as: IMDUR Take 30 mg by mouth in the morning.   levothyroxine  150 MCG tablet Commonly known as: SYNTHROID  Take 150 mcg by mouth daily before breakfast.    lisinopril -hydrochlorothiazide  20-25 MG tablet Commonly known as: ZESTORETIC  Take 1 tablet by mouth daily.   metoprolol  tartrate 25 MG tablet Commonly known as: LOPRESSOR  Take 1 tablet (25 mg total) by mouth every 8 (eight) hours.   montelukast 10 MG tablet Commonly known as: SINGULAIR Take 10 mg by mouth at bedtime.   pravastatin  10 MG tablet Commonly known as: PRAVACHOL  Take 1 tablet (10 mg total) by mouth daily at 6 PM.   QUEtiapine  25 MG tablet Commonly known as: SEROQUEL  Take 25 mg by mouth 2 (two) times daily.   sertraline  100 MG tablet Commonly known as: ZOLOFT  Take 100 mg by mouth in the morning.       LABORATORY STUDIES CBC    Component Value Date/Time   WBC 10.7 (H) 09/27/2023 1435   RBC 4.40 09/27/2023 1435   HGB 12.2 09/27/2023 1523   HCT 36.0 09/27/2023 1523   PLT 173 09/27/2023 1435   MCV 86.4 09/27/2023 1435   MCH 26.8 09/27/2023 1435   MCHC 31.1 09/27/2023 1435   RDW 14.7 09/27/2023 1435   LYMPHSABS 0.5 (L) 04/26/2023 1017   MONOABS 0.5 04/26/2023 1017   EOSABS 0.3 04/26/2023 1017   BASOSABS 0.0 04/26/2023 1017   CMP    Component Value Date/Time   NA 137 09/29/2023 0152   NA 143 05/22/2015 1033   K 3.9 09/29/2023 0152   CL 105 09/29/2023 0152   CO2 22 09/29/2023 0152   GLUCOSE 105 (H) 09/29/2023 0152   BUN 7 (L) 09/29/2023 0152   BUN 19 05/22/2015 1033   CREATININE 0.88 09/29/2023 0152   CALCIUM  9.3 09/29/2023 0152   PROT 7.0 09/28/2023 1331   PROT 7.0 11/25/2014 1116   ALBUMIN 3.3 (L) 09/28/2023 1331   ALBUMIN 3.8 11/25/2014 1116   AST 25 09/28/2023 1331   ALT 18 09/28/2023 1331   ALKPHOS 59 09/28/2023 1331   BILITOT 0.9 09/28/2023 1331   BILITOT 0.3 11/25/2014 1116   GFRNONAA >60 09/29/2023 0152   GFRAA >60 12/27/2018 1127   COAGS Lab Results  Component Value Date   INR 1.0 01/31/2020   INR 0.99 07/13/2017   Lipid Panel    Component Value Date/Time   CHOL 110 09/28/2023 0527   TRIG 55 09/28/2023 0528   HDL 43  09/28/2023 0527   CHOLHDL 2.6 09/28/2023 0527   VLDL 11 09/28/2023 0527   LDLCALC 56 09/28/2023 0527   HgbA1C  Lab Results  Component Value Date   HGBA1C 4.9 09/27/2023   SIGNIFICANT DIAGNOSTIC STUDIES ECHOCARDIOGRAM COMPLETE Result Date: 09/28/2023    ECHOCARDIOGRAM REPORT   Patient Name:   Jessica Norman Date of Exam: 09/28/2023 Medical Rec #:  [id number]        Height:       69.0 in Accession #:    [id number]       Weight:       218.3 lb Date of Birth:  08/02/1950        BSA:          2.144 m Patient Age:  73 years         BP:           144/124 mmHg Patient Gender: F                HR:           90 bpm. Exam Location:  Inpatient Procedure: 2D Echo, Intracardiac Opacification Agent, Cardiac Doppler and Color            Doppler (Both Spectral and Color Flow Doppler were utilized during            procedure). Indications:    Stroke  History:        Patient has no prior history of Echocardiogram examinations.                 Risk Factors:Hypertension and Former Smoker.  Sonographer:    Juliene Rucks Referring Phys: [id number] [facility] KHALIQDINA IMPRESSIONS  1. Left ventricular ejection fraction, by estimation, is 65 to 70%. The left ventricle has normal function. The left ventricle has no regional wall motion abnormalities. There is mild left ventricular hypertrophy. Left ventricular diastolic function could not be evaluated.  2. Right ventricular systolic function is normal. The right ventricular size is normal. There is normal pulmonary artery systolic pressure.  3. The mitral valve is grossly normal. Trivial mitral valve regurgitation.  4. The aortic valve is tricuspid. Aortic valve regurgitation is not visualized.  5. The inferior vena cava is normal in size with greater than 50% respiratory variability, suggesting right atrial pressure of 3 mmHg. Comparison(s): No prior Echocardiogram. FINDINGS  Left Ventricle: Left ventricular ejection fraction, by estimation, is 65 to 70%. The left ventricle has  normal function. The left ventricle has no regional wall motion abnormalities. Definity  contrast agent was given IV to delineate the left ventricular  endocardial borders. The left ventricular internal cavity size was normal in size. There is mild left ventricular hypertrophy. Left ventricular diastolic function could not be evaluated due to atrial fibrillation. Left ventricular diastolic function could not be evaluated. Right Ventricle: The right ventricular size is normal. No increase in right ventricular wall thickness. Right ventricular systolic function is normal. There is normal pulmonary artery systolic pressure. The tricuspid regurgitant velocity is 2.78 m/s, and  with an assumed right atrial pressure of 3 mmHg, the estimated right ventricular systolic pressure is 33.9 mmHg. Left Atrium: Left atrial size was normal in size. Right Atrium: Right atrial size was normal in size. Pericardium: There is no evidence of pericardial effusion. Mitral Valve: The mitral valve is grossly normal. Trivial mitral valve regurgitation. Tricuspid Valve: The tricuspid valve is grossly normal. Tricuspid valve regurgitation is mild. Aortic Valve: The aortic valve is tricuspid. Aortic valve regurgitation is not visualized. Pulmonic Valve: The pulmonic valve was normal in structure. Pulmonic valve regurgitation is not visualized. Aorta: The aortic root and ascending aorta are structurally normal, with no evidence of dilitation. Venous: The inferior vena cava is normal in size with greater than 50% respiratory variability, suggesting right atrial pressure of 3 mmHg. IAS/Shunts: The interatrial septum was not well visualized.  LEFT VENTRICLE PLAX 2D LVIDd:         4.50 cm     Diastology LVIDs:         3.10 cm     LV e' medial:    9.25 cm/s LV PW:         1.10 cm     LV E/e' medial:  11.8 LV IVS:  1.20 cm     LV e' lateral:   9.68 cm/s LVOT diam:     2.00 cm     LV E/e' lateral: 11.3 LV SV:         57 LV SV Index:   27 LVOT Area:      3.14 cm  LV Volumes (MOD) LV vol d, MOD A4C: 61.9 ml LV vol s, MOD A4C: 28.8 ml LV SV MOD A4C:     61.9 ml RIGHT VENTRICLE             IVC RV S prime:     13.90 cm/s  IVC diam: 1.40 cm LEFT ATRIUM           Index LA diam:      4.10 cm 1.91 cm/m LA Vol (A4C): 48.1 ml 22.43 ml/m  AORTIC VALVE LVOT Vmax:   119.00 cm/s LVOT Vmean:  78.100 cm/s LVOT VTI:    0.183 m  AORTA Ao Root diam: 3.00 cm Ao Asc diam:  3.20 cm MITRAL VALVE                TRICUSPID VALVE MV Area (PHT): 3.15 cm     TR Peak grad:   30.9 mmHg MV Decel Time: 241 msec     TR Vmax:        278.00 cm/s MV E velocity: 109.00 cm/s                             SHUNTS                             Systemic VTI:  0.18 m                             Systemic Diam: 2.00 cm Vinie Maxcy MD Electronically signed by Vinie Maxcy MD Signature Date/Time: 09/28/2023/3:22:19 PM    Final    MR BRAIN WO CONTRAST Result Date: 09/28/2023 CLINICAL DATA:  73 year old female status post code stroke presentation yesterday. 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 TNK given, status post endovascular Basilar artery revascularization. EXAM: MRI HEAD WITHOUT CONTRAST TECHNIQUE: Multiplanar, multiecho pulse sequences of the brain and surrounding structures were obtained without intravenous contrast. COMPARISON:  Head CT, CTA head and neck yesterday. Brain MRI 06/30/2022. FINDINGS: Brain: Patchy, confluent left PCA territory restricted diffusion, limited to the occipital lobe. Similar Patchy and confluent left SCA or AICA cerebellar restricted diffusion (series 14, image 47). 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 Confluent T2 and FLAIR hyperintensity in those areas. Punctate petechial hemorrhage possible in the medial left occipital lobe (series 12, image 26) but elsewhere on SWI numerous posterior circulation chronic microhemorrhages are new from the MRI last year. Heidelberg classification 1a: HI1, scattered small petechiae, no  mass effect. Punctate area of diffusion restriction at the tail of the right hippocampus (series 5, image 69). No convincing brainstem restricted diffusion. No other restricted diffusion identified. Cerebral volume not significantly changed from last year. No midline shift, mass effect, evidence of mass lesion, ventriculomegaly, extra-axial collection. Cervicomedullary junction and pituitary are within normal limits. Widely scattered cerebral white matter T2 and FLAIR hyperintensity in the supratentorial brain not significantly changed. Pontine mild to moderate T2 and FLAIR heterogeneity appears stable to minimally increased. Vascular: Major intracranial vascular flow voids appear stable from the 2024 MRI.  Skull and upper cervical spine: Sequelae of cervical spine ACDF. Visualized bone marrow signal is within normal limits. Sinuses/Orbits: Stable, negative. Other: Mastoids well aerated.  Negative visible scalp and face. IMPRESSION: 1. Patchy, relatively small infarcts in the Left cerebellum and Left occipital lobe. Punctate infarct at the tail of the right hippocampus. 2. Although difficult to exclude Heidelberg classification 1a: HI1 petechial blood in the affected Left PCA territory, could instead be chronic as numerous other chronic microhemorrhages in the posterior circulation also appear new from SWI last year. 3. No intracranial mass effect. And otherwise stable from last year chronic signal changes in the bilateral cerebral white matter, pons, left cerebellum. Electronically Signed   By: VEAR Hurst M.D.   On: 09/28/2023 13:12   IR PERCUTANEOUS ART THROMBECTOMY/INFUSION INTRACRANIAL INC DIAG ANGIO Result Date: 09/28/2023 INDICATION: Acute Large Vessel occlusion, EXAM: Mechanical thrombectomy, aspiration thrombectomy CONSENT: Consent was obtained by telephone from the patient's daughter MEDICATIONS: No additional ANESTHESIA/SEDATION: General CONTRAST:  Thirty ml Omnipaque  300 FLUOROSCOPY: Radiation Exposure  Index (as provided by the fluoroscopic device): 325 MGy reference air Kerma COMPLICATIONS: None immediate. TECHNIQUE: Maximal Sterile Barrier Technique was utilized including caps, mask, sterile gowns, sterile gloves, sterile drape, hand hygiene and skin antiseptic. A timeout was performed prior to the initiation of the procedure. FINDINGS: Left subclavian: The left vertebral artery origin is normal. There is sluggish flow in the left vertebral artery. 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 Post thrombectomy left vertebral: There is normal flow in the left vertebral artery and basilar artery with no residual basilar thrombus. There is a normal cerebellar blush. The left posterior cerebral artery fills from the vertebral injection. There is flash flow in the right posterior cerebral artery. Right carotid post thrombectomy: The carotid artery, anterior, middle and posterior cerebral arteries are normal. There is a large posterior communicating artery supplying the right posterior cerebral artery. PROCEDURE: Femoral access was obtained with ultrasound using direct visualization of the needle puncture into the artery. A 80 cm neuron max sheath was placed. The neuron max was advanced over a Berenstein catheter into the left vertebral artery. The Red 72 Sendit catheter was advanced over a synchro support wire to the level of the occlusion and connected to suction. The catheter was then withdrawn under aspiration. This resulted in aspiration of large amount of thrombus. Follow-up arteriogram showed an embolus occluding the distal basilar artery. The Red 72 Sendit catheter was readvanced over a synchro support wire to the level of the occlusion and connected to suction. The catheter was then withdrawn under aspiration. This resulted in aspiration of the embolus. The follow-up arteriogram now demonstrated normal flow in the left vertebral artery, basilar artery, cerebellar arteries and left  posterior cerebral artery. There was flash filling of the right posterior cerebral artery. The catheters were removed from the left vertebral artery and the Berenstein catheter was placed into the right common carotid artery for study of the cerebral circulation to identify the right posterior cerebral artery. This was found to be coming from the right carotid artery and was normal. Hemostasis was obtained with Angio-Seal. IMPRESSION: Large basilar embolus removed with embolectomy PLAN: Patient left intubated and admitted to ICU for observation Electronically Signed   By: Nancyann Burns M.D.   On: 09/28/2023 10:30   IR US  Guide Vasc Access Right Result Date: 09/28/2023 INDICATION: Acute Large Vessel occlusion, EXAM: Mechanical thrombectomy, aspiration thrombectomy CONSENT: Consent was obtained by telephone from the patient's daughter MEDICATIONS: No  additional ANESTHESIA/SEDATION: General CONTRAST:  Thirty ml Omnipaque  300 FLUOROSCOPY: Radiation Exposure Index (as provided by the fluoroscopic device): 325 MGy reference air Kerma COMPLICATIONS: None immediate. TECHNIQUE: Maximal Sterile Barrier Technique was utilized including caps, mask, sterile gowns, sterile gloves, sterile drape, hand hygiene and skin antiseptic. A timeout was performed prior to the initiation of the procedure. FINDINGS: Left subclavian: The left vertebral artery origin is normal. There is sluggish flow in the left vertebral artery. 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 Post thrombectomy left vertebral: There is normal flow in the left vertebral artery and basilar artery with no residual basilar thrombus. There is a normal cerebellar blush. The left posterior cerebral artery fills from the vertebral injection. There is flash flow in the right posterior cerebral artery. Right carotid post thrombectomy: The carotid artery, anterior, middle and posterior cerebral arteries are normal. There is a large posterior  communicating artery supplying the right posterior cerebral artery. PROCEDURE: Femoral access was obtained with ultrasound using direct visualization of the needle puncture into the artery. A 80 cm neuron max sheath was placed. The neuron max was advanced over a Berenstein catheter into the left vertebral artery. The Red 72 Sendit catheter was advanced over a synchro support wire to the level of the occlusion and connected to suction. The catheter was then withdrawn under aspiration. This resulted in aspiration of large amount of thrombus. Follow-up arteriogram showed an embolus occluding the distal basilar artery. The Red 72 Sendit catheter was readvanced over a synchro support wire to the level of the occlusion and connected to suction. The catheter was then withdrawn under aspiration. This resulted in aspiration of the embolus. The follow-up arteriogram now demonstrated normal flow in the left vertebral artery, basilar artery, cerebellar arteries and left posterior cerebral artery. There was flash filling of the right posterior cerebral artery. The catheters were removed from the left vertebral artery and the Berenstein catheter was placed into the right common carotid artery for study of the cerebral circulation to identify the right posterior cerebral artery. This was found to be coming from the right carotid artery and was normal. Hemostasis was obtained with Angio-Seal. IMPRESSION: Large basilar embolus removed with embolectomy PLAN: Patient left intubated and admitted to ICU for observation Electronically Signed   By: Nancyann Burns M.D.   On: 09/28/2023 10:30   IR ANGIO EXTRACRAN SEL COM CAROTID INNOMINATE UNI R MOD SED Result Date: 09/28/2023 INDICATION: Acute Large Vessel occlusion, EXAM: Mechanical thrombectomy, aspiration thrombectomy CONSENT: Consent was obtained by telephone from the patient's daughter MEDICATIONS: No additional ANESTHESIA/SEDATION: General CONTRAST:  Thirty ml Omnipaque  300  FLUOROSCOPY: Radiation Exposure Index (as provided by the fluoroscopic device): 325 MGy reference air Kerma COMPLICATIONS: None immediate. TECHNIQUE: Maximal Sterile Barrier Technique was utilized including caps, mask, sterile gowns, sterile gloves, sterile drape, hand hygiene and skin antiseptic. A timeout was performed prior to the initiation of the procedure. FINDINGS: Left subclavian: The left vertebral artery origin is normal. There is sluggish flow in the left vertebral artery. 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 Post thrombectomy left vertebral: There is normal flow in the left vertebral artery and basilar artery with no residual basilar thrombus. There is a normal cerebellar blush. The left posterior cerebral artery fills from the vertebral injection. There is flash flow in the right posterior cerebral artery. Right carotid post thrombectomy: The carotid artery, anterior, middle and posterior cerebral arteries are normal. There is a large posterior communicating artery  supplying the right posterior cerebral artery. PROCEDURE: Femoral access was obtained with ultrasound using direct visualization of the needle puncture into the artery. A 80 cm neuron max sheath was placed. The neuron max was advanced over a Berenstein catheter into the left vertebral artery. The Red 72 Sendit catheter was advanced over a synchro support wire to the level of the occlusion and connected to suction. The catheter was then withdrawn under aspiration. This resulted in aspiration of large amount of thrombus. Follow-up arteriogram showed an embolus occluding the distal basilar artery. The Red 72 Sendit catheter was readvanced over a synchro support wire to the level of the occlusion and connected to suction. The catheter was then withdrawn under aspiration. This resulted in aspiration of the embolus. The follow-up arteriogram now demonstrated normal flow in the left vertebral artery, basilar artery,  cerebellar arteries and left posterior cerebral artery. There was flash filling of the right posterior cerebral artery. The catheters were removed from the left vertebral artery and the Berenstein catheter was placed into the right common carotid artery for study of the cerebral circulation to identify the right posterior cerebral artery. This was found to be coming from the right carotid artery and was normal. Hemostasis was obtained with Angio-Seal. IMPRESSION: Large basilar embolus removed with embolectomy PLAN: Patient left intubated and admitted to ICU for observation Electronically Signed   By: Nancyann Burns M.D.   On: 09/28/2023 10:30   DG Abd Portable 1V Result Date: 09/27/2023 CLINICAL DATA:  Orogastric tube placement. EXAM: PORTABLE ABDOMEN - 1 VIEW COMPARISON:  Abdominopelvic CT 12/27/2018. FINDINGS: 1424 hours. Tip of the enteric tube overlies the L2 vertebral body, likely within the distal stomach. The visualized bowel gas pattern is nonobstructive. Mildly prominent colonic stool. There is contrast material within the renal collecting systems bilaterally attributed to previous CTA. Mild degenerative changes in the spine without acute osseous abnormality. IMPRESSION: Enteric tube tip overlies the distal stomach. Electronically Signed   By: Elsie Perone M.D.   On: 09/27/2023 15:08   Portable Chest x-ray Result Date: 09/27/2023 CLINICAL DATA:  [id number] Endotracheal tube present [id number] EXAM: PORTABLE CHEST 1 VIEW COMPARISON:  Radiographs 07/13/2017 and 04/26/2023. Cardiac CT 11/25/2022. FINDINGS: Tip of the endotracheal tube is at the carina and should be withdrawn approximately 5 cm. An enteric tube projects below the diaphragm, tip not visualized on this examination. There are low lung volumes with mild resulting atelectasis at both lung bases. No confluent airspace disease, pneumothorax or significant pleural effusion identified. The heart size and mediastinal contours are stable with aortic  atherosclerosis. Patient is status post lower cervical fusion. No acute osseous findings are evident. IMPRESSION: 1. Tip of the endotracheal tube is at the carina and should be withdrawn approximately 5 cm. 2. Low lung volumes with mild bibasilar atelectasis. 3. These results will be called to the ordering clinician or representative by the Radiologist Assistant, and communication documented in the PACS or [employer]. Electronically Signed   By: Elsie Perone M.D.   On: 09/27/2023 15:06   CT ANGIO HEAD NECK W WO CM (CODE STROKE) Result Date: 09/27/2023 EXAM: CTA Head and Neck with Intravenous Contrast. CLINICAL HISTORY: Neuro deficit, acute, stroke suspected. Left sided gaze. AMS TECHNIQUE: Axial CTA images of the head and neck performed with intravenous contrast. MIP reconstructed images were created and reviewed. Axial computed tomography images of the head/brain performed without intravenous contrast. Note: Per PQRS, the description of internal carotid artery percent stenosis, including 0 percent or normal exam,  is based on [place] American Symptomatic Carotid Endarterectomy Trial (NASCET) criteria. Dose reduction technique was used including one or more of the following: automated exposure control, adjustment of mA and kV according to patient size, and/or iterative reconstruction. CONTRAST: Without and with; 75mL iohexol  (OMNIPAQUE ) 350 MG/ML injection COMPARISON: CT head same day FINDINGS: CTA NECK: COMMON CAROTID ARTERIES: Mild-to-moderate atherosclerosis of the visualized aortic arch involving the origin of the left subclavian artery without significant stenosis. INTERNAL CAROTID ARTERIES: Mild calcified atherosclerosis at the right carotid bifurcation without hemodynamically significant stenosis. Minimal focal atherosclerosis at the left carotid bifurcation without hemodynamically significant stenosis. VERTEBRAL ARTERIES: The left vertebral artery is dominant. Atherosclerosis at the vertebral artery  origins. There is mild stenosis at the right vertebral artery origin. There is mild tortuosity of the left V1 segment. The vertebral arteries are patent from the origins to the proximal V4 segments. There is diminished contrast within the vertebral arteries within the V3 and proximal V4 segments. There is occlusion of the V4 segment of the right vertebral artery likely distal to the origin of the right PICA. There is tapering and significantly diminished contrast within the distal left V4 segment. CTA HEAD: The intracranial internal carotid arteries are patent bilaterally. Atherosclerosis of the bilateral carotid siphons with mild stenosis of the bilateral cavernous segments. ANTERIOR CEREBRAL ARTERIES: The anterior cerebral arteries are patent bilaterally. MIDDLE CEREBRAL ARTERIES: The middle cerebral arteries are patent bilaterally. POSTERIOR CEREBRAL ARTERIES: The right posterior cerebral artery is supplied via the right posterior communicating artery and appears patent although somewhat diminished in caliber. There is reconstitution of the P1 segment of the left PCA. There is severe stenosis of the P2P segment of the left PCA. There are patent parieto-occipital branches of the left PCA. There is likely occlusion of the calcarine artery on the left. BASILAR ARTERY: Occlusion of the proximal basilar artery. OTHER: There is faint contrast within the proximal aspect of the superior cerebellar arteries. The left superior cerebellar artery is occluded proximally. The right superior cerebellar artery appears patent but diminished in caliber. There is diminished contrast within the posterior inferior cerebellar arteries bilaterally. The left PICA origin is possibly extradural. SOFT TISSUES: No acute finding. No masses or lymphadenopathy. BONES: Degenerative changes in the visualized spine. Anterior cervical fusion hardware from C3 to C5. IMPRESSION: 1. 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 2. Severe stenosis  of the P2P and P3 segment of the left PCA with likely occlusion of the calcarine artery on the left. 3. 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 The right superior cerebellar artery appears patent but significantly diminished in caliber. 4. 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 Tapering and significantly diminished contrast within the distal left v4 segment. 5. Mild-to-moderate atherosclerosis of the visualized aortic arch involving the origin of the left subclavian artery. 6. Findings discussed with Dr. Vanessa at 12:20PM on 09/27/23. Electronically signed by: Donnice Mania MD 09/27/2023 12:51 PM EDT RP Workstation: [id number]   CT HEAD CODE STROKE WO CONTRAST Result Date: 09/27/2023 EXAM: CT HEAD WITHOUT CONTRAST 09/27/2023 12:12:00 PM TECHNIQUE: CT of the head was performed without the administration of intravenous contrast. Automated exposure control, iterative reconstruction, and/or weight based adjustment of the mA/kV was utilized to reduce the radiation dose to as low as reasonably achievable. COMPARISON: CT head 48 25 CLINICAL HISTORY: Neuro deficit, acute, stroke suspected. Aphasia. Left sided gaze. FINDINGS: BRAIN AND VENTRICLES: Nonspecific hypoattenuation in the periventricular  and subcortical white matter, most likely representing chronic small vessel disease. Similar volume loss similar prominence of the ventricles. ORBITS: Bilateral lens replacement. SINUSES: No acute abnormality. SOFT TISSUES AND SKULL: No acute soft tissue abnormality. No skull fracture. VASCULATURE: Atherosclerosis involving the carotid siphons. [place] stroke program early CT (ASPECT) score: Ganglionic (caudate, IC, lentiform nucleus, insula, M1-M3): 7. Supraganglionic (M4-M6): 3. Total: 10. IMPRESSION: 1. No acute intracranial abnormality. 2. Findings messaged to Dr. Vanessa at 12:19PM on 09/27/23. Electronically signed by: Donnice Mania MD  09/27/2023 12:20 PM EDT RP Workstation: [id number]       HISTORY OF PRESENT ILLNESS 73 y.o. patient with history of hypertension, hyperlipidemia, GERD, hypothyroidism, bipolar admitted for left gaze deviation and left-sided weakness. 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  HOSPITAL COURSE Patient presented to the hospital after her husband called EMS following a sudden onset severe headache and collapse. She is brought in as a code stroke. CT head was performed and notable for hyperdense basilar artery and CT angio of head and neck demonstrated proximal basilar artery occlusion along with occlusion of the left ICA and occlusion of the right vertebral artery V4 segment and significantly diminished dominant left V4 vertebral artery. NIH score of 27 on arrival.  TNKase  was administered and mechanical thrombectomy was performed with TICI 3 then admitted to stroke team for further workup. CCM was consulted following the procedure and recommended continuing intubation for lung protective ventilation.  Patient was then extubated following successful spontaneous breathing trial.  MRI was performed 24 hours following the procedure, showing small and stable infarcts in the left cerebellum and left occipital lobe.  2D echocardiogram showed EF 65 to 70% with mild LVR. During stroke team's assessment, irregular heart rhythm was found on telemetry and EKG was ordered.  EKG rhythm strip captured A-fib cardiology was consulted. Cardiology recommended pursuing rate control for now plan cardioversion in the future after she is anticoagulated for 3 to 4 weeks uninterrupted.  Patient was then switched from Eliquis  to Pradaxa  due to it being cheaper cost with her insurance and will continue this at discharge.  Physical therapy and Occupational Therapy assessed the patient and worked with her, and determined her ADL  needs would be best suited for home health.  We discussed with the patient the importance of maintaining normotensive blood pressures, and following up with her cardiologist to address her atrial fibrillation, as this was likely the reason for her stroke.    DISCHARGE EXAM  PHYSICAL EXAM General:  Alert, well-nourished, well-developed patient in no acute distress Psych:  Mood and affect appropriate for situation CV: Regular rate and rhythm on monitor Respiratory:  Regular, unlabored respirations on room air  NEURO:  Mental Status: AA&Ox3  Speech/Language: speech is without dysarthria or aphasia.  Naming, repetition, fluency, and comprehension intact.  Cranial Nerves:  II: PERRL. Visual fields full.  III, IV, VI: EOMI. Eyelids elevate symmetrically.  V: Sensation is intact to light touch and symmetrical to face.  VII: Smile is symmetrical.  VIII: hearing intact to voice. IX, X: Palate elevates symmetrically. Phonation is normal.  KP:Dynloizm shrug 5/5. XII: tongue is midline without fasciculations. Motor: 5/5 strength to all muscle groups tested.  Tone: is normal and bulk is normal Sensation- Intact to light touch bilaterally.  Coordination: FTN intact bilaterally, HKS: no ataxia in BLE.  Gait- deferred  1a Level of Conscious.: 0 1b LOC Questions:  0 1c LOC Commands: 0 2 Best Gaze: 0 3 Visual: 0 4 Facial Palsy: 0 5a Motor Arm - left: 0 5b Motor Arm - Right: 0 6a Motor Leg - Left: 0 6b Motor Leg - Right: 0 7 Limb Ataxia: 0 8 Sensory: 0 9 Best Language: 0 10 Dysarthria: 0 11 Extinct. and Inatten.: 0 TOTAL: 0  Discharge Diet       Diet   Diet Heart Room service appropriate? Yes; Fluid consistency: Thin   liquids  DISCHARGE PLAN Disposition: Home with Home Health Pradaxa  (dabigatran ) twice a day for secondary stroke prevention for atrial fibrillation Ongoing stroke risk factor control by Primary Care Physician at time of discharge Follow-up PCP Buren Rock HERO, MD  in 2 weeks. Follow-up with cardiologist for cardioversion as an outpatient Follow up with Dr. Avis Popp, neurology Follow-up in [facility] in 8 weeks, office to schedule an appointment. Able to see NP in clinic.  I have personally obtained history,examined this patient, reviewed notes, independently viewed imaging studies, participated in medical decision making and plan of care.ROS completed by me personally and pertinent positives fully documented  I have made any additions or clarifications directly to the above note. Agree with note above.   Total time spent in doing discharge summary 40 minutes Eather Popp, MD Medical Director [facility] Pager: [phone] 09/29/2023 12:58 PM

## 2023-09-29 NOTE — Progress Notes (Signed)
 Discharge teaching complete. Meds, diet, activity, follow up appointments and stroke education reviewed and all questions answered. Copy of instructions given to patient and meds sent to [facility] pharmacy. Patient discharged home via wheelchair with husband.

## 2023-11-08 ENCOUNTER — Ambulatory Visit
Admission: RE | Admit: 2023-11-08 | Discharge: 2023-11-08 | Disposition: A | Discharge status: Home / Self Care | Source: Ambulatory Visit | Attending: Family Medicine | Admitting: Family Medicine

## 2023-11-08 DIAGNOSIS — Z780 Asymptomatic menopausal state: Secondary | ICD-10-CM | POA: Insufficient documentation

## 2023-11-08 MED ORDER — SODIUM CHLORIDE 0.9 % IV SOLN: INTRAVENOUS | Status: DC

## 2023-11-09 ENCOUNTER — Encounter: Payer: Self-pay | Admitting: Internal Medicine

## 2023-11-09 ENCOUNTER — Other Ambulatory Visit: Payer: Self-pay

## 2023-11-09 ENCOUNTER — Ambulatory Visit
Admission: RE | Admit: 2023-11-09 | Discharge: 2023-11-09 | Disposition: A | Discharge status: Home / Self Care | Attending: Internal Medicine | Admitting: Internal Medicine

## 2023-11-09 ENCOUNTER — Encounter: Admission: RE | Disposition: A | Payer: Self-pay | Source: Home / Self Care | Attending: Internal Medicine

## 2023-11-09 ENCOUNTER — Ambulatory Visit: Admitting: Certified Registered Nurse Anesthetist

## 2023-11-09 DIAGNOSIS — M069 Rheumatoid arthritis, unspecified: Secondary | ICD-10-CM | POA: Diagnosis not present

## 2023-11-09 DIAGNOSIS — I447 Left bundle-branch block, unspecified: Secondary | ICD-10-CM | POA: Insufficient documentation

## 2023-11-09 DIAGNOSIS — I4891 Unspecified atrial fibrillation: Secondary | ICD-10-CM | POA: Diagnosis present

## 2023-11-09 DIAGNOSIS — I10 Essential (primary) hypertension: Secondary | ICD-10-CM | POA: Diagnosis not present

## 2023-11-09 DIAGNOSIS — E785 Hyperlipidemia, unspecified: Secondary | ICD-10-CM | POA: Diagnosis not present

## 2023-11-09 DIAGNOSIS — Z79899 Other long term (current) drug therapy: Secondary | ICD-10-CM | POA: Diagnosis not present

## 2023-11-09 DIAGNOSIS — Z7951 Long term (current) use of inhaled steroids: Secondary | ICD-10-CM | POA: Insufficient documentation

## 2023-11-09 HISTORY — DX: Unspecified atrial fibrillation: I48.91

## 2023-11-09 HISTORY — DX: Cerebral infarction, unspecified: I63.9

## 2023-11-09 HISTORY — PX: CARDIOVERSION: SHX1299

## 2023-11-09 SURGERY — CARDIOVERSION
Anesthesia: General

## 2023-11-09 MED ORDER — PROPOFOL 10 MG/ML IV BOLUS
INTRAVENOUS | Status: DC | PRN
  Administered 2023-11-09: 40 mg via INTRAVENOUS
  Administered 2023-11-09 (×2): 20 mg via INTRAVENOUS

## 2023-11-09 MED ORDER — PROPOFOL 10 MG/ML IV BOLUS
INTRAVENOUS | Status: AC
  Filled 2023-11-09: qty 20

## 2023-11-09 NOTE — Anesthesia Preprocedure Evaluation (Addendum)
 Anesthesia Evaluation  Patient identified by MRN, date of birth, ID band Patient awake    Reviewed: Allergy & Precautions, NPO status , Patient's Chart, lab work & pertinent test results  History of Anesthesia Complications Negative for: history of anesthetic complications  Airway Mallampati: III  TM Distance: <3 FB Neck ROM: full    Dental  (+) Chipped, Poor Dentition, Missing, Partial Lower   Pulmonary neg shortness of breath, asthma , former smoker   Pulmonary exam normal        Cardiovascular Exercise Tolerance: Good hypertension, + CAD  + dysrhythmias Atrial Fibrillation  Rhythm:irregular     Neuro/Psych  Headaches  Neuromuscular disease  negative psych ROS   GI/Hepatic Neg liver ROS, hiatal hernia,GERD  Controlled,,  Endo/Other  Hypothyroidism    Renal/GU negative Renal ROS  negative genitourinary   Musculoskeletal   Abdominal   Peds  Hematology negative hematology ROS (+)   Anesthesia Other Findings Past Medical History: No date: Arthritis     Comment:  everywhere No date: Asthma No date: Bipolar disorder (HCC) No date: GERD (gastroesophageal reflux disease) No date: Hearing loss 2011: History of hiatal hernia     Comment:  treated with medication, no surgical repair No date: History of Sjogren's disease No date: HLD (hyperlipidemia) No date: Hypertension No date: Hypothyroidism     Comment:  per patient, not taking synthroid  anymore No date: Incomplete bladder emptying No date: Memory loss No date: Obesity No date: Recurrent UTI No date: Ringing in ear, bilateral No date: Vision abnormalities  Past Surgical History: No date: ABDOMINAL HYSTERECTOMY No date: BACK SURGERY     Comment:  x 3 No date: CARPAL TUNNEL RELEASE; Right No date: COLONOSCOPY 12/19/2014: EXCISION OF TONGUE LESION; N/A     Comment:  Procedure: EXCISION OF TONGUE LESION;  Surgeon:               Carolee Hunter, MD;   Location: [facility] ORS;  Service: ENT;              Laterality: N/A; No date: EYE SURGERY     Comment:  cataracts No date: FOOT SURGERY; Right     Comment:  lump on bottom of toe. removed and straightened toes 09/27/2023: IR ANGIO EXTRACRAN SEL COM CAROTID INNOMINATE UNI R MOD SED 09/27/2023: IR PERCUTANEOUS ART THROMBECTOMY/INFUSION INTRACRANIAL INC  DIAG ANGIO 09/27/2023: IR US  GUIDE VASC ACCESS RIGHT No date: JOINT REPLACEMENT; Left     Comment:  tkr 12/29/2022: LEFT HEART CATH AND CORONARY ANGIOGRAPHY; Left     Comment:  Procedure: LEFT HEART CATH AND CORONARY ANGIOGRAPHY;                Surgeon: Florencio Cara BIRCH, MD;  Location: [facility] INVASIVE              CV LAB;  Service: Cardiovascular;  Laterality: Left; 02/04/2020: LUMBAR LAMINECTOMY/DECOMPRESSION MICRODISCECTOMY; Right     Comment:  Procedure: L2-4 OPEN LAMINECTOMY, RIGHT L5/S1               HEMILAMINECTOMY;  Surgeon: Bluford Standing, MD;  Location:               [facility] ORS;  Service: Neurosurgery;  Laterality: Right; No date: NECK SURGERY     Comment:  cadaver tissue used for cervical surgery 09/27/2023: RADIOLOGY WITH ANESTHESIA; N/A     Comment:  Procedure: RADIOLOGY WITH ANESTHESIA;  Surgeon:               Radiologist, Medication,  MD;  Location: [facility] OR;  Service:               Radiology;  Laterality: N/A; 01/16/2015: SHOULDER ARTHROSCOPY WITH OPEN ROTATOR CUFF REPAIR; Left     Comment:  Procedure: SHOULDER ARTHROSCOPY WITH mini OPEN ROTATOR               CUFF REPAIR,subacromial decompression, abrasion               chondrop;asty with microfracture of glenoid, excision               distal clavicle, biceps tenodesis, extensive debridement;              Surgeon: Norleen JINNY Maltos, MD;  Location: [facility] ORS;  Service:              Orthopedics;  Laterality: Left; 07/26/2017: TOTAL KNEE ARTHROPLASTY; Left     Comment:  Procedure: TOTAL KNEE ARTHROPLASTY;  Surgeon: Maltos Norleen JINNY, MD;  Location: [facility] ORS;  Service: Orthopedics;                 Laterality: Left; 07/01/2020: TOTAL KNEE ARTHROPLASTY; Right     Comment:  Procedure: TOTAL KNEE ARTHROPLASTY;  Surgeon: Maltos Norleen JINNY, MD;  Location: [facility] ORS;  Service: Orthopedics;                Laterality: Right; No date: VESICOVAGINAL FISTULA CLOSURE W/ TAH     Reproductive/Obstetrics negative OB ROS                              Anesthesia Physical Anesthesia Plan  ASA: 3  Anesthesia Plan: General   Post-op Pain Management:    Induction: Intravenous  PONV Risk Score and Plan: Propofol  infusion and TIVA  Airway Management Planned: Natural Airway and Nasal Cannula  Additional Equipment:   Intra-op Plan:   Post-operative Plan:   Informed Consent: I have reviewed the patients History and Physical, chart, labs and discussed the procedure including the risks, benefits and alternatives for the proposed anesthesia with the patient or authorized representative who has indicated his/her understanding and acceptance.     Dental Advisory Given  Plan Discussed with: Anesthesiologist, CRNA and Surgeon  Anesthesia Plan Comments: (Patient consented for risks of anesthesia including but not limited to:  - adverse reactions to medications - risk of airway placement if required - damage to eyes, teeth, lips or other oral mucosa - nerve damage due to positioning  - sore throat or hoarseness - Damage to heart, brain, nerves, lungs, other parts of body or loss of life  Patient voiced understanding and assent.)         Anesthesia Quick Evaluation

## 2023-11-09 NOTE — Anesthesia Postprocedure Evaluation (Signed)
 Anesthesia Post Note  Patient: Jessica Norman  Procedure(s) Performed: CARDIOVERSION  Patient location during evaluation: [facility] Anesthesia Type: General Level of consciousness: awake and alert Pain management: pain level controlled Vital Signs Assessment: post-procedure vital signs reviewed and stable Respiratory status: spontaneous breathing, nonlabored ventilation, respiratory function stable and patient connected to nasal cannula oxygen Cardiovascular status: blood pressure returned to baseline and stable Postop Assessment: no apparent nausea or vomiting Anesthetic complications: no   No notable events documented.   Last Vitals:  Vitals:   11/09/23 1249 11/09/23 1300  BP: (!) 143/70 (!) 140/72  Pulse: (!) 59 (!) 58  Resp: 17 16  Temp:    SpO2: 94% 96%    Last Pain:  Vitals:   11/09/23 1300  TempSrc:   PainSc: 0-No pain                 Fairy POUR [place]

## 2023-11-09 NOTE — Transfer of Care (Signed)
 Immediate Anesthesia Transfer of Care Note  Patient: Jessica Norman  Procedure(s) Performed: CARDIOVERSION  Patient Location: [facility]  Anesthesia Type:General  Level of Consciousness: awake, alert , drowsy, and patient cooperative  Airway & Oxygen Therapy: Patient Spontanous Breathing and Patient connected to nasal cannula oxygen  Post-op Assessment: Report given to RN and Post -op Vital signs reviewed and stable  Post vital signs: Reviewed and stable  Last Vitals:  Vitals Value Taken Time  BP 136/71 11/09/23 12:36  Temp    Pulse 58 11/09/23 12:36  Resp 27 11/09/23 12:36  SpO2 100 % 11/09/23 12:36    Last Pain:  Vitals:   11/09/23 1127  TempSrc: Oral  PainSc: 5       Patients Stated Pain Goal: 0 (11/09/23 1127)  Complications: No notable events documented.

## 2023-11-09 NOTE — Anesthesia Procedure Notes (Signed)
 Date/Time: 11/09/2023 12:15 PM  Performed by: Dominica Krabbe, CRNAPre-anesthesia Checklist: Patient identified, Emergency Drugs available, Suction available, Patient being monitored and Timeout performed Patient Re-evaluated:Patient Re-evaluated prior to induction Oxygen Delivery Method: Nasal cannula Preoxygenation: Pre-oxygenation with 100% oxygen Induction Type: IV induction

## 2023-11-10 ENCOUNTER — Encounter: Payer: Self-pay | Admitting: Internal Medicine

## 2023-12-16 NOTE — CV Procedure (Signed)
 Electrical Cardioversion Procedure Note   Procedure: Electrical Cardioversion Indications:  Atrial Fibrillation  Procedure Details Consent: Risks of procedure as well as the alternatives and risks of each were explained to the (patient/caregiver).  Consent for procedure obtained. Time Out: Verified patient identification, verified procedure, site/side was marked, verified correct patient position, special equipment/implants available, medications/allergies/relevent history reviewed, required imaging and test results available.  Performed  Patient placed on cardiac monitor, pulse oximetry, supplemental oxygen as necessary.  Sedation given: Propofol  as per anesthesia Pacer pads placed anterior and posterior chest.  Cardioverted 1 time(s).  Cardioverted at 200J.  Evaluation Findings: Post procedure EKG shows: NSR Complications: None Patient did tolerate procedure well.   Cara Lovelace MD 11/09/23 1310

## 2024-01-05 ENCOUNTER — Telehealth: Payer: Self-pay

## 2024-01-05 NOTE — Patient Outreach (Signed)
 First telephone outreach attempt to obtain mRS. No answer. Left message for returned call.  [facility]  Population Health Care Management Assistant  Direct Dial: [phone]  Fax: [phone] Website: [email or web address]

## 2024-01-09 ENCOUNTER — Telehealth: Payer: Self-pay

## 2024-01-09 NOTE — Patient Outreach (Signed)
 Second telephone outreach attempt to obtain mRS. No answer. Left message for returned call.  Shereen [facility]  Population Health Care Management Assistant  Direct Dial: [phone]  Fax: [phone] Website: [email or web address]

## 2024-01-11 ENCOUNTER — Telehealth: Payer: Self-pay

## 2024-01-11 NOTE — Patient Outreach (Signed)
 Telephone outreach to patient to obtain mRS was successfully completed. MRS= 4  Jessica Norman [facility]BCI Assistant Direct Dial: [phone]  Fax: [phone] Website: [email or web address]
